# Patient Record
Sex: Male | Born: 1940 | Race: White | Hispanic: No | Marital: Married | Smoking: Former smoker
Health system: Southern US, Community
[De-identification: ages and names within clinical notes are randomized; demographics above are authoritative.]

## PROBLEM LIST (undated history)

## (undated) DIAGNOSIS — E785 Hyperlipidemia, unspecified: Secondary | ICD-10-CM

## (undated) DIAGNOSIS — C61 Malignant neoplasm of prostate: Secondary | ICD-10-CM

## (undated) DIAGNOSIS — K529 Noninfective gastroenteritis and colitis, unspecified: Secondary | ICD-10-CM

## (undated) DIAGNOSIS — R42 Dizziness and giddiness: Secondary | ICD-10-CM

## (undated) DIAGNOSIS — J101 Influenza due to other identified influenza virus with other respiratory manifestations: Secondary | ICD-10-CM

## (undated) DIAGNOSIS — R51 Headache: Secondary | ICD-10-CM

## (undated) DIAGNOSIS — R55 Syncope and collapse: Secondary | ICD-10-CM

## (undated) DIAGNOSIS — I495 Sick sinus syndrome: Secondary | ICD-10-CM

## (undated) DIAGNOSIS — K5790 Diverticulosis of intestine, part unspecified, without perforation or abscess without bleeding: Secondary | ICD-10-CM

## (undated) DIAGNOSIS — I10 Essential (primary) hypertension: Secondary | ICD-10-CM

## (undated) DIAGNOSIS — D126 Benign neoplasm of colon, unspecified: Secondary | ICD-10-CM

## (undated) DIAGNOSIS — T783XXA Angioneurotic edema, initial encounter: Secondary | ICD-10-CM

## (undated) DIAGNOSIS — R3129 Other microscopic hematuria: Secondary | ICD-10-CM

## (undated) HISTORY — DX: Diverticulosis of intestine, part unspecified, without perforation or abscess without bleeding: K57.90

## (undated) HISTORY — DX: Influenza due to other identified influenza virus with other respiratory manifestations: J10.1

## (undated) HISTORY — DX: Essential (primary) hypertension: I10

## (undated) HISTORY — DX: Other microscopic hematuria: R31.29

## (undated) HISTORY — DX: Hyperlipidemia, unspecified: E78.5

## (undated) HISTORY — DX: Benign neoplasm of colon, unspecified: D12.6

## (undated) HISTORY — PX: CATARACT EXTRACTION: SUR2

## (undated) HISTORY — DX: Syncope and collapse: R55

## (undated) HISTORY — DX: Malignant neoplasm of prostate: C61

## (undated) HISTORY — DX: Dizziness and giddiness: R42

## (undated) HISTORY — PX: RADIOACTIVE SEED IMPLANT: SHX5150

## (undated) HISTORY — DX: Sick sinus syndrome: I49.5

## (undated) HISTORY — DX: Angioneurotic edema, initial encounter: T78.3XXA

## (undated) HISTORY — DX: Noninfective gastroenteritis and colitis, unspecified: K52.9

## (undated) HISTORY — DX: Headache: R51

---
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
## 1997-12-09 ENCOUNTER — Encounter: Payer: Self-pay | Admitting: *Deleted

## 1997-12-09 ENCOUNTER — Emergency Department (HOSPITAL_COMMUNITY): Admission: EM | Admit: 1997-12-09 | Discharge: 1997-12-09 | Payer: Self-pay | Admitting: *Deleted

## 2000-11-04 LAB — HM COLONOSCOPY

## 2001-09-18 ENCOUNTER — Emergency Department (HOSPITAL_COMMUNITY): Admission: EM | Admit: 2001-09-18 | Discharge: 2001-09-18 | Payer: Self-pay | Admitting: Emergency Medicine

## 2005-03-26 DIAGNOSIS — C61 Malignant neoplasm of prostate: Secondary | ICD-10-CM

## 2005-03-26 HISTORY — DX: Malignant neoplasm of prostate: C61

## 2005-10-19 ENCOUNTER — Ambulatory Visit (HOSPITAL_COMMUNITY): Admission: RE | Admit: 2005-10-19 | Discharge: 2005-10-19 | Payer: Self-pay | Admitting: Urology

## 2005-12-04 ENCOUNTER — Ambulatory Visit: Admission: RE | Admit: 2005-12-04 | Discharge: 2005-12-16 | Payer: Self-pay | Admitting: Radiation Oncology

## 2006-06-10 ENCOUNTER — Ambulatory Visit: Payer: Self-pay | Admitting: Internal Medicine

## 2006-08-21 ENCOUNTER — Ambulatory Visit: Payer: Self-pay | Admitting: Internal Medicine

## 2006-08-27 ENCOUNTER — Encounter: Admission: RE | Admit: 2006-08-27 | Discharge: 2006-09-16 | Payer: Self-pay | Admitting: Internal Medicine

## 2006-09-30 ENCOUNTER — Ambulatory Visit: Payer: Self-pay | Admitting: Internal Medicine

## 2006-09-30 LAB — CONVERTED CEMR LAB
ALT: 22 units/L (ref 0–53)
AST: 24 units/L (ref 0–37)
Albumin: 3.9 g/dL (ref 3.5–5.2)
Alkaline Phosphatase: 84 units/L (ref 39–117)
BUN: 15 mg/dL (ref 6–23)
Basophils Absolute: 0.1 10*3/uL (ref 0.0–0.1)
Calcium: 8.9 mg/dL (ref 8.4–10.5)
Chloride: 110 meq/L (ref 96–112)
Cholesterol: 175 mg/dL (ref 0–200)
Creatinine, Ser: 0.9 mg/dL (ref 0.4–1.5)
GFR calc non Af Amer: 89 mL/min
HCT: 43.7 % (ref 39.0–52.0)
Ketones, ur: NEGATIVE mg/dL
MCHC: 33.4 g/dL (ref 30.0–36.0)
Neutrophils Relative %: 62.1 % (ref 43.0–77.0)
Nitrite: NEGATIVE
Platelets: 336 10*3/uL (ref 150–400)
RBC: 5.05 M/uL (ref 4.22–5.81)
RDW: 13.1 % (ref 11.5–14.6)
Sodium: 142 meq/L (ref 135–145)
Total Bilirubin: 0.8 mg/dL (ref 0.3–1.2)
Total CHOL/HDL Ratio: 5.6
Urine Glucose: NEGATIVE mg/dL
Urobilinogen, UA: 0.2 (ref 0.0–1.0)
VLDL: 89 mg/dL — ABNORMAL HIGH (ref 0–40)
pH: 7.5 (ref 5.0–8.0)

## 2006-10-02 ENCOUNTER — Observation Stay (HOSPITAL_COMMUNITY): Admission: EM | Admit: 2006-10-02 | Discharge: 2006-10-03 | Payer: Self-pay | Admitting: Emergency Medicine

## 2006-10-03 ENCOUNTER — Ambulatory Visit: Payer: Self-pay | Admitting: Internal Medicine

## 2006-10-04 ENCOUNTER — Ambulatory Visit: Payer: Self-pay | Admitting: Internal Medicine

## 2006-10-09 ENCOUNTER — Ambulatory Visit: Payer: Self-pay | Admitting: Internal Medicine

## 2006-10-24 ENCOUNTER — Ambulatory Visit: Payer: Self-pay | Admitting: Internal Medicine

## 2006-10-25 DIAGNOSIS — I10 Essential (primary) hypertension: Secondary | ICD-10-CM | POA: Insufficient documentation

## 2007-04-14 ENCOUNTER — Ambulatory Visit: Payer: Self-pay | Admitting: Internal Medicine

## 2007-04-14 DIAGNOSIS — E781 Pure hyperglyceridemia: Secondary | ICD-10-CM | POA: Insufficient documentation

## 2007-05-09 ENCOUNTER — Encounter: Payer: Self-pay | Admitting: Internal Medicine

## 2007-05-19 ENCOUNTER — Ambulatory Visit: Payer: Self-pay | Admitting: Internal Medicine

## 2007-05-20 ENCOUNTER — Encounter: Payer: Self-pay | Admitting: Internal Medicine

## 2007-05-20 LAB — CONVERTED CEMR LAB
ALT: 26 units/L (ref 0–53)
BUN: 14 mg/dL (ref 6–23)
GFR calc Af Amer: 96 mL/min
HDL: 35.6 mg/dL — ABNORMAL LOW (ref >39.0)
Hgb A1c MFr Bld: 5.7 % (ref 4.6–6.0)
LDL Cholesterol: 100 mg/dL — ABNORMAL HIGH (ref 0–99)
Potassium: 3.2 meq/L — ABNORMAL LOW (ref 3.5–5.1)
Sodium: 139 meq/L (ref 135–145)
Total CHOL/HDL Ratio: 4.7
Triglycerides: 152 mg/dL — ABNORMAL HIGH (ref 0–149)
VLDL: 30 mg/dL (ref 0–40)

## 2007-06-10 ENCOUNTER — Ambulatory Visit: Payer: Self-pay | Admitting: Internal Medicine

## 2007-06-10 LAB — CONVERTED CEMR LAB
BUN: 12 mg/dL (ref 6–23)
CO2: 31 meq/L (ref 19–32)
Calcium: 9.2 mg/dL (ref 8.4–10.5)
Creatinine, Ser: 1 mg/dL (ref 0.4–1.5)
GFR calc Af Amer: 96 mL/min
Glucose, Bld: 97 mg/dL (ref 70–99)
Potassium: 3.2 meq/L — ABNORMAL LOW (ref 3.5–5.1)

## 2007-06-18 ENCOUNTER — Ambulatory Visit: Payer: Self-pay | Admitting: Internal Medicine

## 2007-06-19 ENCOUNTER — Ambulatory Visit: Payer: Self-pay | Admitting: Internal Medicine

## 2007-09-19 ENCOUNTER — Encounter: Payer: Self-pay | Admitting: Internal Medicine

## 2007-10-30 ENCOUNTER — Telehealth: Payer: Self-pay | Admitting: Internal Medicine

## 2007-10-30 ENCOUNTER — Ambulatory Visit: Payer: Self-pay | Admitting: Internal Medicine

## 2007-10-30 LAB — CONVERTED CEMR LAB
AST: 22 units/L (ref 0–37)
BUN: 13 mg/dL (ref 6–23)
Chloride: 105 meq/L (ref 96–112)
Cholesterol: 167 mg/dL (ref 0–200)
Creatinine, Ser: 1 mg/dL (ref 0.4–1.5)
GFR calc Af Amer: 96 mL/min
GFR calc non Af Amer: 79 mL/min
LDL Cholesterol: 115 mg/dL — ABNORMAL HIGH (ref 0–99)
Total CHOL/HDL Ratio: 5.1
Triglycerides: 99 mg/dL (ref 0–149)
VLDL: 20 mg/dL (ref 0–40)

## 2007-11-06 ENCOUNTER — Telehealth: Payer: Self-pay | Admitting: Internal Medicine

## 2007-11-10 ENCOUNTER — Ambulatory Visit: Payer: Self-pay | Admitting: Internal Medicine

## 2007-11-10 LAB — CONVERTED CEMR LAB
BUN: 15 mg/dL (ref 6–23)
Calcium: 9.5 mg/dL (ref 8.4–10.5)
Creatinine, Ser: 1.1 mg/dL (ref 0.4–1.5)
GFR calc Af Amer: 86 mL/min
GFR calc non Af Amer: 71 mL/min
Glucose, Bld: 76 mg/dL (ref 70–99)

## 2007-11-14 ENCOUNTER — Ambulatory Visit: Payer: Self-pay | Admitting: Internal Medicine

## 2007-11-14 DIAGNOSIS — E785 Hyperlipidemia, unspecified: Secondary | ICD-10-CM | POA: Insufficient documentation

## 2007-11-18 ENCOUNTER — Encounter: Payer: Self-pay | Admitting: Internal Medicine

## 2007-11-19 ENCOUNTER — Telehealth: Payer: Self-pay | Admitting: Internal Medicine

## 2008-01-26 ENCOUNTER — Ambulatory Visit: Payer: Self-pay | Admitting: Internal Medicine

## 2008-01-26 LAB — CONVERTED CEMR LAB
ALT: 27 units/L (ref 0–53)
AST: 29 units/L (ref 0–37)
Total CHOL/HDL Ratio: 4.4

## 2008-02-13 ENCOUNTER — Encounter: Payer: Self-pay | Admitting: Internal Medicine

## 2008-05-05 ENCOUNTER — Ambulatory Visit: Payer: Self-pay | Admitting: Internal Medicine

## 2008-05-05 LAB — CONVERTED CEMR LAB
BUN: 15 mg/dL (ref 6–23)
Chloride: 106 meq/L (ref 96–112)
GFR calc Af Amer: 108 mL/min
GFR calc non Af Amer: 89 mL/min
Hgb A1c MFr Bld: 5.8 % (ref 4.6–6.0)
Potassium: 3.7 meq/L (ref 3.5–5.1)
Sodium: 141 meq/L (ref 135–145)
TSH: 1.49 microintl units/mL (ref 0.35–5.50)

## 2008-05-17 ENCOUNTER — Ambulatory Visit: Payer: Self-pay | Admitting: Internal Medicine

## 2008-11-17 ENCOUNTER — Ambulatory Visit: Payer: Self-pay | Admitting: Internal Medicine

## 2008-11-17 LAB — CONVERTED CEMR LAB
ALT: 15 units/L (ref 0–53)
AST: 20 units/L (ref 0–37)
Bilirubin, Direct: 0.3 mg/dL (ref 0.0–0.3)
Calcium: 9.6 mg/dL (ref 8.4–10.5)
Cholesterol: 122 mg/dL (ref 0–200)
Glucose, Bld: 98 mg/dL (ref 70–99)
Potassium: 3.5 meq/L (ref 3.5–5.3)
Sodium: 141 meq/L (ref 135–145)
TSH: 1.599 microintl units/mL (ref 0.350–4.500)
Total CHOL/HDL Ratio: 3.2
Total Protein: 7.5 g/dL (ref 6.0–8.3)
Triglycerides: 124 mg/dL (ref <150)

## 2008-11-18 ENCOUNTER — Encounter: Payer: Self-pay | Admitting: Internal Medicine

## 2009-01-17 ENCOUNTER — Telehealth: Payer: Self-pay | Admitting: Internal Medicine

## 2009-02-07 ENCOUNTER — Encounter: Payer: Self-pay | Admitting: Internal Medicine

## 2009-05-11 ENCOUNTER — Ambulatory Visit: Payer: Self-pay | Admitting: Internal Medicine

## 2009-06-16 ENCOUNTER — Encounter: Payer: Self-pay | Admitting: Internal Medicine

## 2009-10-31 ENCOUNTER — Encounter: Payer: Self-pay | Admitting: Internal Medicine

## 2009-10-31 LAB — CONVERTED CEMR LAB
AST: 18 units/L (ref 0–37)
BUN: 14 mg/dL (ref 6–23)
CO2: 25 meq/L (ref 19–32)
Calcium: 9.4 mg/dL (ref 8.4–10.5)
Chloride: 102 meq/L (ref 96–112)
Creatinine, Ser: 0.94 mg/dL (ref 0.40–1.50)
LDL Cholesterol: 52 mg/dL (ref 0–99)
TSH: 1.41 microintl units/mL (ref 0.350–4.500)

## 2009-11-09 ENCOUNTER — Ambulatory Visit: Payer: Self-pay | Admitting: Internal Medicine

## 2009-11-09 DIAGNOSIS — C61 Malignant neoplasm of prostate: Secondary | ICD-10-CM | POA: Insufficient documentation

## 2010-03-26 DIAGNOSIS — K529 Noninfective gastroenteritis and colitis, unspecified: Secondary | ICD-10-CM

## 2010-03-26 HISTORY — DX: Noninfective gastroenteritis and colitis, unspecified: K52.9

## 2010-04-25 NOTE — Miscellaneous (Signed)
Summary: lab orders--tsh,bmp,ast,alt,flp  Clinical Lists Changes  Orders: Added new Test order of T-Basic Metabolic Panel [id number]) - Signed Added new Test order of T-Lipid Profile [phone]) - Signed Added new Test order of T-TSH [phone]) - Signed Added new Test order of T-ALT/SGPT ([id number]) - Signed Added new Test order of T-AST/SGOT ([id number]) - Signed

## 2010-04-25 NOTE — Letter (Signed)
Summary: Isac Caddy MD  Isac Caddy MD   Imported By: Lanelle Bal 06/30/2009 10:23:16  _____________________________________________________________________  External Attachment:    Type:   Image     Comment:   External Document

## 2010-04-25 NOTE — Assessment & Plan Note (Signed)
Summary: 6 month follow up/mhf   Vital Signs:  Patient profile:   70 year old male Height:      70 inches Weight:      181.50 pounds BMI:     26.14 O2 Sat:      97 % on Room air Temp:     97.6 degrees F oral Pulse rate:   67 / minute Pulse rhythm:   regular Resp:     16 per minute BP sitting:   122 / 70  (right arm) Cuff size:   regular  Vitals Entered By: Glendell Docker CMA (May 11, 2009 7:59 AM)  O2 Flow:  Room air  Primary Care Provider:  D. Thomos Lemons DO  CC:  6 Month Follow up .  History of Present Illness: 6 Month Follow up   70 year old white male for followup Elevated PSA / prostate ca-  he was unable to get an appointment with urologist at [facility].  Pt seen by Dr. Gerome Sam urologist at [place].  mother in law in poor health - lives near [employer].  she has lung cancer  htn - stable  Preventive Screening-Counseling & Management  Alcohol-Tobacco     Smoking Status: quit  Allergies: 1)  ! Asa 2)  ! 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 Laverle Patter Diverticulosis, colon  Hypertension   Syncope History of Intermittent Vertigo ACE angioedema    Past Surgical History: Cataract extraction Tonsillectomy       Family History: Mother deceased in her 44s secondary to stroke which occurred during endarterectomy Father deceased age 27 secondary to colon cancer       Social History: Retired - formerly Data processing manager for [employer] Married Never Smoked Alcohol use-no    Smoking Status:  quit  Physical Exam  General:  alert, well-developed, and well-nourished.   Neck:  supple, no masses, and no carotid bruits.   Lungs:  normal respiratory effort and normal breath sounds.   Heart:  normal rate, regular rhythm, and no gallop.   Pulses:  dorsalis pedis and posterior tibial pulses are full and equal bilaterally Extremities:  No clubbing, cyanosis, edema   Impression &  Recommendations:  Problem # 1:  HYPERTENSION (ICD-401.9) well controlled. Maintain current medication regimen.  His updated medication list for this problem includes:    Carvedilol 25 Mg Tabs (Carvedilol) .Marland Kitchen... Take 1 tablet by mouth two times a day    Chlorthalidone 25 Mg Tabs (Chlorthalidone) ..... One by mouth q am  BP today: 122/70 Prior BP: 138/70 (11/17/2008)  Labs Reviewed: K+: 3.5 (11/17/2008) Creat: : 0.97 (11/17/2008)   Chol: 122 (11/17/2008)   HDL: 38 (11/17/2008)   LDL: 59 (11/17/2008)   TG: 124 (11/17/2008)  Problem # 2:  PROSTATE CANCER, HX OF (ICD-V10.46) Prostate biopsy planned.  He establish with Dr. Gerome Sam in [place].  Pt advised to hold asa and fish oil before precedure.  Complete Medication List: 1)  Low-dose Aspirin 81 Mg Tabs (Aspirin) .... By mouth once daily 2)  Alfalfa 500 Mg Tabs (Alfalfa) .... Take 1 tablet by mouth two times a day 3)  Prostate Tabs (Specialty vitamins products) .... By mouth once daily 4)  Carvedilol 25 Mg Tabs (Carvedilol) .... Take 1 tablet by mouth two times a day 5)  Chlorthalidone 25 Mg Tabs (Chlorthalidone) .... One by mouth q am 6)  Klor-con M20 20 Meq Tbcr (Potassium chloride crys cr) .Marland KitchenMarland KitchenMarland Kitchen  Two by mouth once daily 7)  Fish Oil 1000 Mg Caps (Omega-3 fatty acids) .... Take 1 tablet by mouth two times a day 8)  Osteo Bi-flex Joint Shield Tabs (Misc natural products) .... Take 1 tablet by mouth four times a day 9)  Pravastatin Sodium 20 Mg Tabs (Pravastatin sodium) .... One by mouth qhs 10)  Vitamin D3 1000 Unit Caps (Cholecalciferol) .... Take 1 capsule by mouth once a day 11)  Vitamin C 500 Mg Tabs (Ascorbic acid) .... Take 1 tablet by mouth once a day as needed 12)  Advil 200 Mg Caps (Ibuprofen) .... Take 1 tablet by mouth two times a day as needed  Patient Instructions: 1)  [email or web address]-calorie-counter.[email or web address]/ 2)  Please schedule a follow-up appointment in 6 months. 3)  BMP prior to visit, ICD-9:  401.9 4)  AST, ALT, FLP  prior to  visit, ICD-9:  272.4 5)  TSH prior to visit, ICD-9: 272.4 6)  Please return for lab work one (1) week before your next appointment.  Prescriptions: PRAVASTATIN SODIUM 20 MG TABS (PRAVASTATIN SODIUM) one by mouth qhs  #90 x 3   Entered and Authorized by:   D. Thomos Lemons DO   Signed by:   D. Thomos Lemons DO on 05/11/2009   Method used:   Electronically to        [facility]* (retail)       [address] [place], [place]  [address]       Ph: [phone]       Fax: [phone]   RxID:   [id number] KLOR-CON M20 20 MEQ  TBCR (POTASSIUM CHLORIDE CRYS CR) two by mouth once daily  #180 x 3   Entered and Authorized by:   D. Thomos Lemons DO   Signed by:   D. Thomos Lemons DO on 05/11/2009   Method used:   Electronically to        [facility]* (retail)       [address] [place], [place]  [address]       Ph: [phone]       Fax: [phone]   RxID:   [id number] CHLORTHALIDONE 25 MG  TABS (CHLORTHALIDONE) one by mouth q am  #90 x 3   Entered and Authorized by:   D. Thomos Lemons DO   Signed by:   D. Thomos Lemons DO on 05/11/2009   Method used:   Electronically to        [facility] (retail)       [address] [place], [place]  [address]       Ph: [phone]       Fax: [phone]   RxID:   [id number] CARVEDILOL 25 MG  TABS (CARVEDILOL) Take 1 tablet by mouth two times a day  #180 x 3   Entered and Authorized by:   D. Thomos Lemons DO   Signed by:   D. Thomos Lemons DO on 05/11/2009   Method used:   Electronically to        [facility] (retail)       [address] [place], [place]  [address]       Ph: [phone]       Fax: [phone]   RxID:   [id number]   Current Allergies (reviewed today): ! ASA ! ACE INHIBITORS

## 2010-04-25 NOTE — Assessment & Plan Note (Signed)
Summary: 6 MONTH FOLLOW UP/MHF   Vital Signs:  Patient profile:   70 year old male Weight:      181 pounds BMI:     26.06 O2 Sat:      96 % on Room air Temp:     97.9 degrees F oral Pulse rate:   69 / minute Pulse rhythm:   regular Resp:     18 per minute BP sitting:   120 / 70  (right arm) Cuff size:   regular  Vitals Entered By: Glendell Docker CMA (November 09, 2009 7:55 AM)  O2 Flow:  Room air CC: 6 Month Follow up  Is Patient Diabetic? No Pain Assessment Patient in pain? no       Does patient need assistance? Functional Status Self care Ambulation Normal Comments discuss side effects from medication for seed implantation   Primary Care Provider:  D. Thomos Lemons DO  CC:  6 Month Follow up .  History of Present Illness: 70 y/o white male for follow up  interval hx: 2  out 12 biopsy showed prostate cancer gleason score unknown pt elected to have radioactive seed implants some urinary freq and fatigue after procedure but starting to feel better  htn - has not been taking potassium pills regularly  hyperlipidemia - tolerating meds,  fair dietary compliance  Preventive Screening-Counseling & Management  Alcohol-Tobacco     Smoking Status: quit  Allergies: 1)  ! Asa 2)  ! 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   Head:  normocephalic and atraumatic.   Neck:  supple, no masses, and no carotid bruits.   Lungs:  normal respiratory effort and normal breath sounds.   Heart:  normal rate, regular rhythm, and no gallop.   Extremities:  No clubbing, cyanosis, edema Psych:  normally interactive, good eye  contact, not anxious appearing, and not depressed appearing.     Impression & Recommendations:  Problem # 1:  HYPERTENSION (ICD-401.9) Assessment Unchanged reviewed high K foods.  take potassium supplement regularly  His updated medication list for this problem includes:    Carvedilol 25 Mg Tabs (Carvedilol) .Marland Kitchen... Take 1 tablet by mouth two times a day    Chlorthalidone 25 Mg Tabs (Chlorthalidone) ..... One by mouth q am  BP today: 120/70 Prior BP: 122/70 (05/11/2009)  Labs Reviewed: K+: 3.3 (10/31/2009) Creat: : 0.94 (10/31/2009)   Chol: 128 (10/31/2009)   HDL: 37 (10/31/2009)   LDL: 52 (10/31/2009)   TG: 195 (10/31/2009)  Problem # 2:  HYPERLIPIDEMIA (ICD-272.4) Assessment: Unchanged  His updated medication list for this problem includes:    Pravastatin Sodium 20 Mg Tabs (Pravastatin sodium) ..... One by mouth qhs  Problem # 3:  PROSTATE CANCER (ICD-185) followed by Dr. Merry Lofty s/p radioactive seed implantation  Complete Medication List: 1)  Low-dose Aspirin 81 Mg Tabs (Aspirin) .... By mouth once daily 2)  Alfalfa 500 Mg Tabs (Alfalfa) .... Take 1 tablet by mouth two times a day 3)  Prostate Tabs (Specialty vitamins products) .... By mouth once daily 4)  Carvedilol 25 Mg Tabs (Carvedilol) .... Take 1 tablet by mouth two times a  day 5)  Chlorthalidone 25 Mg Tabs (Chlorthalidone) .... One by mouth q am 6)  Klor-con M20 20 Meq Tbcr (Potassium chloride crys cr) .... Two by mouth once daily 7)  Fish Oil 1000 Mg Caps (Omega-3 fatty acids) .... Take 1 tablet by mouth two times a day 8)  Osteo Bi-flex Joint Shield Tabs (Misc natural products) .... Take 1 tablet by mouth four times a day 9)  Pravastatin Sodium 20 Mg Tabs (Pravastatin sodium) .... One by mouth qhs 10)  Vitamin D3 1000 Unit Caps (Cholecalciferol) .... Take 1 capsule by mouth once a day 11)  Vitamin C 500 Mg Tabs (Ascorbic acid) .... Take 1 tablet by mouth once a day as needed 12)  Advil 200 Mg Caps (Ibuprofen)  .... Take 1 tablet by mouth two times a day as needed  Patient Instructions: 1)  Please schedule a follow-up appointment in 1 year -  30 minutes visit. 2)  BMP prior to visit, ICD-9:  401.9 3)  Hepatic Panel prior to visit, ICD-9:  272.4 4)  Lipid Panel prior to visit, ICD-9: 272.4 5)  TSH prior to visit, ICD-9:  272.4 6)  Please return for lab work one (1) week before your next appointment.  Prescriptions: PRAVASTATIN SODIUM 20 MG TABS (PRAVASTATIN SODIUM) one by mouth qhs  #90 x 3   Entered and Authorized by:   D. Thomos Lemons DO   Signed by:   D. Thomos Lemons DO on 11/09/2009   Method used:   Electronically to        [facility] (retail)       [address] [place], [place]  [address]       Ph: [phone]       Fax: [phone]   RxID:   [id number] KLOR-CON M20 20 MEQ  TBCR (POTASSIUM CHLORIDE CRYS CR) two by mouth once daily  #180 x 3   Entered and Authorized by:   D. Thomos Lemons DO   Signed by:   D. Thomos Lemons DO on 11/09/2009   Method used:   Electronically to        [facility] (retail)       [address] [place], [place]  [address]       Ph: [phone]       Fax: [phone]   RxID:   [id number] CHLORTHALIDONE 25 MG  TABS (CHLORTHALIDONE) one by mouth q am  #90 x 3   Entered and Authorized by:   D. Thomos Lemons DO   Signed by:   D. Thomos Lemons DO on 11/09/2009   Method used:   Electronically to        [facility] (retail)       [address] [place], [place]  [address]       Ph: [phone]       Fax: [phone]   RxID:   [id number] CARVEDILOL 25 MG  TABS (CARVEDILOL) Take 1 tablet by mouth two times a day  #180 x 3   Entered and Authorized by:   D. Thomos Lemons DO   Signed by:   D. Thomos Lemons DO on 11/09/2009   Method used:   Electronically to        [facility] (retail)       [address] [place], [place]  [address]  Ph: [phone]       Fax: [phone]   RxID:   [id number]   Current Allergies (reviewed today): !  ASA ! ACE INHIBITORS

## 2010-06-25 ENCOUNTER — Emergency Department (HOSPITAL_BASED_OUTPATIENT_CLINIC_OR_DEPARTMENT_OTHER)
Admission: EM | Admit: 2010-06-25 | Discharge: 2010-06-25 | Disposition: A | Discharge status: Home / Self Care | Payer: Medicare PPO | Attending: Emergency Medicine | Admitting: Emergency Medicine

## 2010-06-25 DIAGNOSIS — K5289 Other specified noninfective gastroenteritis and colitis: Secondary | ICD-10-CM | POA: Insufficient documentation

## 2010-06-25 DIAGNOSIS — R112 Nausea with vomiting, unspecified: Secondary | ICD-10-CM | POA: Insufficient documentation

## 2010-06-25 DIAGNOSIS — I10 Essential (primary) hypertension: Secondary | ICD-10-CM | POA: Insufficient documentation

## 2010-06-25 DIAGNOSIS — E860 Dehydration: Secondary | ICD-10-CM | POA: Insufficient documentation

## 2010-06-25 DIAGNOSIS — Z79899 Other long term (current) drug therapy: Secondary | ICD-10-CM | POA: Insufficient documentation

## 2010-06-25 DIAGNOSIS — E785 Hyperlipidemia, unspecified: Secondary | ICD-10-CM | POA: Insufficient documentation

## 2010-06-25 LAB — CBC
Platelets: 214 10*3/uL (ref 150–400)
RBC: 4.7 MIL/uL (ref 4.22–5.81)
WBC: 16.3 10*3/uL — ABNORMAL HIGH (ref 4.0–10.5)

## 2010-06-25 LAB — DIFFERENTIAL
Basophils Absolute: 0 10*3/uL (ref 0.0–0.1)
Basophils Relative: 0 % (ref 0–1)
Eosinophils Absolute: 0.1 10*3/uL (ref 0.0–0.7)
Neutrophils Relative %: 90 % — ABNORMAL HIGH (ref 43–77)

## 2010-06-25 LAB — BASIC METABOLIC PANEL
Chloride: 106 mEq/L (ref 96–112)
Creatinine, Ser: 0.9 mg/dL (ref 0.4–1.5)
GFR calc Af Amer: >60 mL/min (ref >60)
Potassium: 3.1 mEq/L — ABNORMAL LOW (ref 3.5–5.1)
Sodium: 144 mEq/L (ref 135–145)

## 2010-08-08 NOTE — Discharge Summary (Signed)
NAME:  Billy Burns, Billy Burns               ACCOUNT NO.:  [id number]   MEDICAL RECORD NO.:  [id number]          PATIENT TYPE:  INP   LOCATION:  3707                         FACILITY:  [facility]   PHYSICIAN:  Valerie A. Felicity Coyer, MDDATE OF BIRTH:  Feb 03, 1941   DATE OF ADMISSION:  10/02/2006  DATE OF DISCHARGE:  10/03/2006                               DISCHARGE SUMMARY   DISCHARGE DIAGNOSES:  1. Status post syncopal event with nausea and vomiting.  2. History of hypertension, blood pressure meds currently on hold.  3. Leukocytosis, improving.   HISTORY OF PRESENT ILLNESS:  Billy Burns is a 70 year old male who was  admitted on October 02, 2006 after a syncopal event.  He has a known history  of hypertension and apparently was at home when he developed a dizzy  lightheaded sensation and apparently tried to get himself down to the  floor and he struck his right elbow and started to bleed.  When the  patient came to, he vomited.  He felt fine thereafter when he was seen  in the emergency department.  He was admitted for further evaluation and  treatment.   PAST MEDICAL HISTORY:  Hypertension.   COURSE OF HOSPITALIZATION:  Syncope with nausea and vomiting.  The  patient was admitted and underwent serial cardiac enzymes which were  negative.  A D-dimer was performed and was noted to be positive  prompting a CT angio of the chest which was negative for pulmonary  embolus.  X-ray of the right elbow showed no fracture.  Chest x-ray  showed bibasilar atelectasis and interstitial prominence.  He was noted  to have leukocytosis on admission with a white blood cell count of  17,000, this trended downward.  Followup CBC noted white blood cell  count of 13,000.  He has remained afebrile.  His urinalysis is clean.  There are no acute signs of infection at this time.  His blood pressure  medications were held.  Blood pressure at time of discharge was 141/72.  We will defer resuming these medications to patient's  primary care.  He  apparently also has a history of vertigo and could consider outpatient  followup in regards to possible underlying vertigo.   MEDICATIONS AT TIME OF DISCHARGE:  Aspirin 81 mg p.o. daily.   PERTINENT LABORATORIES AT TIME OF DISCHARGE:  Serial cardiac enzymes  negative.  Hemoglobin 13.5, hematocrit 40, white blood cell count  13,000, platelets 292,000, BUN 11, creatinine 0.99, sodium 140,  potassium 4.1.   DISPOSITION:  Patient will be discharged to home.   FOLLOWUP:  The patient has an appointment scheduled already with Dr.  Thomos Lemons for tomorrow; plan to continue this appointment as scheduled.  He is instructed to go to the emergency room should he develop chest  pain, shortness of breath or if he passes out.      Sandford Craze, NP      Raenette Rover. Felicity Coyer, MD  Electronically Signed    MO/MEDQ  D:  10/03/2006  T:  10/03/2006  Job:  [id number]   cc:   Barbette Hair. Artist Pais, DO

## 2010-08-08 NOTE — H&P (Signed)
NAME:  Billy Burns, Billy Burns               ACCOUNT NO.:  [id number]   MEDICAL RECORD NO.:  [id number]          PATIENT TYPE:  INP   LOCATION:  1823                         FACILITY:  [facility]   PHYSICIAN:  Valerie A. Felicity Coyer, MDDATE OF BIRTH:  05-19-1940   DATE OF ADMISSION:  10/02/2006  DATE OF DISCHARGE:                              HISTORY & PHYSICAL   PRIMARY CARE PHYSICIAN:  Dr. Artist Pais.   CHIEF COMPLAINT:  Syncope.   HISTORY OF PRESENT ILLNESS:  The patient is a 70 year old white male  with past medical history of hypertension, who earlier today was at home  when he tripped and fell, injuring his left elbow pretty severely.  There was a fair amount of bleeding.  At some point soon after, the  patient sustained a syncopal event and what the patient recalls is that  he looked and saw his elbow wound and that there was quite a bit of  blood and he felt a little lightheadedness.  He went down to 1 knee and  was trying not to pass out, but then according to the patient, his wife  then noted that he did indeed pass out and was unresponsive for a few  seconds.  There was no loss of bowel or bladder continence, no tremors,  but when the patient awoke, he was fully alert and oriented, but still  feeling weak.  He was brought into the emergency room for further  evaluation.  In the emergency room, he was noted to have a white count  of 17.8 with 86% shift.  The rest of his labs were noted for a D-dimer  of 2.82 and a CPK level of 203 with a normal MB and troponin.  The  patient's x-rays show a normal CT scan of the head, some bibasilar  atelectasis on his chest x-ray and no fracture or acute bony abnormality  of the chest.  His vitals on admission were quite stable.  With these  findings, the patient was started on some IV fluids, given medication  for pain and nausea.  Currently, he says he is feeling much better.   REVIEW OF SYSTEMS:  He denies any current headache.  No vision changes.  No  dysphagia.  No chest pain or palpitations.  No shortness of breath,  wheeze, cough, abdominal pain, hematuria, dysuria, constipation or  diarrhea.  His only complaint is his left shoulder.  Review of systems  is otherwise negative.   PAST MEDICAL HISTORY:  Hypertension.   MEDICATIONS:  He is on lisinopril and metoprolol.   ALLERGIES:  He has no known drug allergies.   SOCIAL HISTORY:  He denies any tobacco, alcohol or drug use.   FAMILY HISTORY:  Noncontributory.   PHYSICAL EXAMINATION:  VITALS ON ADMISSION:  Temperature 97.8, heart  rate 82, blood pressure 150/70, respirations 16 and O2 sat 99% on room  air.  He was not orthostatic.  HEART:  Regular rate and rhythm.  S1 and S2.  LUNGS:  Clear to auscultation bilaterally.  ABDOMEN:  Soft, nontender and non-distended.  Positive bowel sounds.  EXTREMITIES:  No clubbing, cyanosis  or edema, with the exception of  course of his elbow being wrapped.   LABORATORY WORK:  White count is 17.8, H&H 15 and 44, MCV of 85,  platelet count 293,000 with an 86% shift.  Sodium 140, potassium 4.1,  chloride 102, bicarb 26, BUN 17, creatinine 0.99, glucose 127.  LFTs are  unremarkable.  Coagulations are unremarkable.  UA is unremarkable.  CPK  166, MB 1.6, troponin I less than 0.05.  Second set:  CPK is slightly  elevated, more at 203.  His D-dimer is elevated at 2.82.   X-RAYS:  As per HPI.   ASSESSMENT AND PLAN:  1. Syncopal event:  As far as the etiology of his actual syncopal      event, he may have been vasovagal from the site of blood coming      from his elbow.  It is difficult to saw what exactly happened.  He      does not appear to have seizure or another reason for this, but      favor for right now conservative measures with hydration and      continuing to follow how the patient does.  2. Elevated D-dimer:  I feel that this is more elevated due to a CPK      level.  I do not think the patient has had a pulmonary embolus; he       shows no signs of it; he is not hypoxic, never complained of      shortness of breath or chest pain and I have a low index of      suspicion for that.  Therefore, I have cancelled the CT scan      attributing to this, but I will defer to Dr. Felicity Coyer, his      attending physician.  3. Leukocytosis:  Some of this may be indeed stress margination from      the actual syncopal event and the muscle injury.  I would favor      again gentle hydration at this time.  I see no signs of infection.      His chest x-ray and urine have cleared, no reason for antibiotics,      but we will check a repeat CPK level as well as a CBC this      afternoon to better evaluate the patient.  In the meantime, we will      place the patient on telemetry.  4. Hypertension:  We are currently holding the patient's medications,      watching his blood pressure.      Hollice Espy, M.D.   Electronically Signed     ______________________________  Raenette Rover Felicity Coyer, MD    SKK/MEDQ  D:  10/02/2006  T:  10/02/2006  Job:  [id number]   cc:   Barbette Hair. Artist Pais, DO

## 2010-08-11 NOTE — Assessment & Plan Note (Signed)
[facility]                           PRIMARY CARE OFFICE NOTE   DENARIUS, SESLER                        MRN:          [id number]  DATE:06/10/2006                            DOB:          25-Jul-1940    CHIEF COMPLAINT:  New patient for practice.   HISTORY OF PRESENT ILLNESS:  The patient is a 70 year old white male  here to establish primary care.  He was formerly followed by Dr. Izola Price  of [facility].  His medical history is significant for high blood  pressure.  He has been on antihypertensives since 2003.  He denies any  history of coronary artery disease or stroke in the past.  His blood  pressure at home is usually well controlled, with blood pressure  readings in the 120s systolic.  He was referred to a urologist within  the last one or two years regarding elevated PSA.  He is unsure whether  he has had definitive diagnosis of prostate cancer, but sounds like  patient underwent prostate biopsy and is scheduled for followup.  He has  a family history of colon cancer, his father deceased at age 28.  He has  had colonoscopies in the past, with most recent one performed in 2007.  Did not reveal any colon polyps but only mild diverticulosis.   PAST MEDICAL HISTORY:  Summary:  1. Hypertension.  2. Diverticulosis.  3. Questionable prostate cancer.  4. History of episodic vertigo once in 1999, repeat 2003, unclear      etiology.  5. Status post tonsillectomy, 1950s.  6. Status post bilateral cataract surgery.   CURRENT MEDICATIONS:  1. Metoprolol 50 mg twice a day.  2. Hydrochlorothiazide 25 mg once a day.  3. Aspirin 81 mg once a day.   MEDICATION ALLERGIES:  ASPIRIN WHICH CAUSES STOMACH UPSET.   SOCIAL HISTORY:  Patient is married.  He is a retired Data processing manager for  [employer].   FAMILY HISTORY:  Mother deceased in her 81s secondary to CVA which  occurred during surgery repair.  Father deceased at age 80 secondary to  colon  cancer.   HABITS:  He denies any alcohol or any tobacco or recreational drug use.   REVIEW OF SYSTEMS:  No fevers, chills.  No HEENT symptoms.  He is  working on mild weight loss.  No chest pain or dyspnea.  He does  exercise on a regular basis.  No heart burn, nausea, vomiting,  constipation, diarrhea.  No dark stools or blood in his stool.  Denies  any erectile dysfunction or other sexual difficulty.   PHYSICAL EXAMINATION:  VITAL SIGNS:  Height is 5 foot 10, weight is 187  pounds.  Temperature 97.1, pulse is 61, BP is 142/72 and the last in the  seated position.  GENERAL:  The patient is a pleasant, well-developed, well-nourished 67-  year-old white male in no apparent distress.  HEENT:  Normocephalic, atraumatic.  Pupils were equally reactive to  light bilaterally.  Extraocular muscles are intact.  Patient was  anicteric.  Conjunctivae was within normal limits.  Evidence of previous  iridectomy bilaterally.  Oropharyngeal exam was unremarkable.  External  auditory canals and tympanic membranes were clear bilaterally.  Hearing  is grossly normal.  NECK:  Supple, no adenopathy, carotid bruits or cardiomegaly.  CHEST:  Normal inspiratory effort.  Chest was clear to auscultation  bilaterally.  No rhonchi, rales or wheezing.  CARDIOVASCULAR:  Regular rate and rhythm, no significant murmurs, rubs  or gallops.  ABDOMEN:  Slightly protuberant but nontender, positive bowel sounds, no  organomegaly.  MUSCULOSKELETAL:  No clubbing, cyanosis or edema.  The patient had  intact pedis dorsalis pulses, equal and symmetric.  NEUROLOGIC:  Cranial nerves 2-12 is grossly intact.  He was nonfocal.  He ambulates without any assistive devices.   IMPRESSION/RECOMMENDATIONS:  1. Hypertension, stable.  2. History of elevated PSA, question prostate cancer.  3. Diverticulosis.  4. History of intermittent vertigo, unclear etiology.  5. Health maintenance.   RECOMMENDATIONS:  Patient is to continue his  current regimen.  We will  try to obtain recent labs that were performed by his previous primary  care physician and also obtain notes from his urologist.  Followup time  is in approximately 8 weeks.     Barbette Hair. Artist Pais, DO  Electronically Signed    RDY/MedQ  DD: 06/10/2006  DT: 06/10/2006  Job #: [id number]

## 2010-10-30 ENCOUNTER — Encounter: Payer: Self-pay | Admitting: Internal Medicine

## 2010-11-13 ENCOUNTER — Encounter: Payer: Medicare Other | Admitting: Family Medicine

## 2010-11-14 ENCOUNTER — Ambulatory Visit (INDEPENDENT_AMBULATORY_CARE_PROVIDER_SITE_OTHER): Payer: Medicare Other | Admitting: Family Medicine

## 2010-11-14 ENCOUNTER — Telehealth: Payer: Self-pay | Admitting: Family Medicine

## 2010-11-14 ENCOUNTER — Encounter: Payer: Self-pay | Admitting: Family Medicine

## 2010-11-14 DIAGNOSIS — Z8546 Personal history of malignant neoplasm of prostate: Secondary | ICD-10-CM

## 2010-11-14 DIAGNOSIS — Z23 Encounter for immunization: Secondary | ICD-10-CM

## 2010-11-14 DIAGNOSIS — C61 Malignant neoplasm of prostate: Secondary | ICD-10-CM

## 2010-11-14 DIAGNOSIS — I10 Essential (primary) hypertension: Secondary | ICD-10-CM

## 2010-11-14 DIAGNOSIS — Z0000 Encounter for general adult medical examination without abnormal findings: Secondary | ICD-10-CM

## 2010-11-14 DIAGNOSIS — E785 Hyperlipidemia, unspecified: Secondary | ICD-10-CM

## 2010-11-14 LAB — COMPREHENSIVE METABOLIC PANEL
ALT: 19 U/L (ref 0–53)
Albumin: 4.4 g/dL (ref 3.5–5.2)
CO2: 29 mEq/L (ref 19–32)
Calcium: 9.5 mg/dL (ref 8.4–10.5)
Chloride: 103 mEq/L (ref 96–112)
Creatinine, Ser: 1 mg/dL (ref 0.4–1.5)
GFR: 82.91 mL/min (ref >60.00)
Total Protein: 7.6 g/dL (ref 6.0–8.3)

## 2010-11-14 LAB — CBC WITH DIFFERENTIAL/PLATELET
Eosinophils Relative: 2.5 % (ref 0.0–5.0)
HCT: 44 % (ref 39.0–52.0)
Hemoglobin: 14.8 g/dL (ref 13.0–17.0)
Lymphs Abs: 2 10*3/uL (ref 0.7–4.0)
Monocytes Relative: 6.9 % (ref 3.0–12.0)
Platelets: 264 10*3/uL (ref 150.0–400.0)
WBC: 6.3 10*3/uL (ref 4.5–10.5)

## 2010-11-14 LAB — TSH: TSH: 0.45 u[IU]/mL (ref 0.35–5.50)

## 2010-11-14 MED ORDER — CHLORTHALIDONE 25 MG PO TABS: 25.0000 mg | ORAL_TABLET | Freq: Every day | ORAL | Status: DC

## 2010-11-14 MED ORDER — CARVEDILOL 25 MG PO TABS: 25.0000 mg | ORAL_TABLET | Freq: Two times a day (BID) | ORAL | Status: DC

## 2010-11-14 MED ORDER — POTASSIUM CHLORIDE CRYS ER 20 MEQ PO TBCR: EXTENDED_RELEASE_TABLET | ORAL | Status: DC

## 2010-11-14 MED ORDER — ZOSTER VACCINE LIVE 19400 UNT/0.65ML ~~LOC~~ SOLR: 0.6500 mL | Freq: Once | SUBCUTANEOUS | Status: DC

## 2010-11-14 NOTE — Assessment & Plan Note (Signed)
Problem stable.  Continue current medications and diet appropriate for this condition.  We have reviewed our general long term plan for this problem and also reviewed symptoms and signs that should prompt the patient to call or return to the office. Check lytes/cr today.  

## 2010-11-14 NOTE — Progress Notes (Signed)
Office Note 11/14/2010  CC:  Chief Complaint  Patient presents with  . Annual Exam    no problems    HPI:  Billy Burns is a 70 y.o. White male who is here for CPE.  Former pt of Dr. Dora Sims is my first time seeing him. He feels well, has no acute complaints. Gets some achiness in most joints from time to time, c/w osteoarthritic pain.  No swelling, no redness, no signif stiffness. Has occasional lightheaded feeling with going from squatting to standing, no syncope. Home bp monitoring shows normal bps.   Compliant with all meds w/out signif side effect.  Regarding colon cancer screening, he says his colonoscopy about 10 yrs ago showed no polyps/masses but he feels like his body "fought it" a lot and doesn't want a repeat screening colonoscopy.  He is agreeable to iFOB testing.   Past Medical History  Diagnosis Date  . Cancer 2008    prostate, Dr. Laverle Patter  . Diverticulosis   . Hypertension   . Syncope   . Allergic angioedema     ACE inhibitor    Past Surgical History  Procedure Date  . Eye surgery     cataract (lens implants bilat)  . Tonsillectomy   . Radioactive seed implant     prostate 07/2009.  PSA dropped to below 2 after this (Dr. Gerome Sam at [facility])    Family History  Problem Relation Age of Onset  . Stroke Mother     during endarterectomy  . Cancer Father     colon    History   Social History  . Marital Status: Married    Spouse Name: Kathie Rhodes    Number of Children: N/A  . Years of Education: N/A   Occupational History  . Retired     Data processing manager for [employer]   Social History Main Topics  . Smoking status: Former Smoker    Quit date: 03/26/1964  . Smokeless tobacco: Never Used  . Alcohol Use: No  . Drug Use: No  . Sexually Active: Not on file   Other Topics Concern  . Not on file   Social History Narrative   Married, retired from [employer] 2004.No T/A/Ds.Walks daily, push mows his yard.    No  outpatient prescriptions prior to visit.    Allergies  Allergen Reactions  . Ace Inhibitors     REACTION: Angioedema  . Aspirin     REACTION: Stomach Discomfort    ROS Review of Systems  Constitutional: Negative for fever, chills, appetite change and fatigue.  HENT: Negative for ear pain, congestion, sore throat, neck stiffness and dental problem.   Eyes: Negative for discharge, redness and visual disturbance.  Respiratory: Negative for cough, chest tightness, shortness of breath and wheezing.   Cardiovascular: Negative for chest pain, palpitations and leg swelling.  Gastrointestinal: Negative for nausea, vomiting, abdominal pain, diarrhea and blood in stool.  Genitourinary: Negative for dysuria, urgency, frequency, hematuria, flank pain and difficulty urinating.  Musculoskeletal: Negative for myalgias, back pain, joint swelling and arthralgias.  Skin: Negative for pallor and rash.  Neurological: Negative for dizziness, speech difficulty, weakness and headaches.  Hematological: Negative for adenopathy. Does not bruise/bleed easily.  Psychiatric/Behavioral: Negative for confusion and sleep disturbance. The patient is not nervous/anxious.      PE; Blood pressure 130/70, pulse 70, height 5' 9.5" (1.765 m), weight 175 lb (79.379 kg), SpO2 92.00%. Gen: Alert, well appearing.  Patient is oriented to person, place, time, and  situation. HEENT: Scalp without lesions or hair loss.  Ears: EACs clear, normal epithelium.  TMs with good light reflex and landmarks bilaterally.  Eyes: no injection, icteris, swelling, or exudate.  EOMI, PERRLA. Nose: no drainage or turbinate edema/swelling.  No injection or focal lesion.  Mouth: lips without lesion/swelling.  Oral mucosa pink and moist.  Dentition intact and without obvious caries or gingival swelling.  Oropharynx without erythema, exudate, or swelling.  Neck: supple, ROM full.  Carotids 2+ bilat, without bruit.  No lymphadenopathy, thyromegaly, or  mass. Chest: symmetric expansion, nonlabored respirations.  Clear and equal breath sounds in all lung fields.   CV: RRR, no m/r/g.  Peripheral pulses 2+ and symmetric. ABD: soft, NT, ND, BS normal.  No hepatospenomegaly or mass.  No bruits. EXT: no clubbing, cyanosis, or edema.   Pertinent labs:  none  ASSESSMENT AND PLAN:   Health maintenance examination Doing well.  Continue daily exercise and prudent diet. Zostavax rx given today---he'll go to [facility] for this. iFOB will be done ASAP for colon cancer screening. Routine labs done today. PSA's followed by his urologist.  HYPERTENSION Problem stable.  Continue current medications and diet appropriate for this condition.  We have reviewed our general long term plan for this problem and also reviewed symptoms and signs that should prompt the patient to call or return to the office. Check lytes/cr today.  HYPERLIPIDEMIA Check FLP today.  PROSTATE CANCER Routine f/u with urologist.     FOLLOW UP:  Return in about 1 year (around 11/14/2011) for cpe.

## 2010-11-14 NOTE — Assessment & Plan Note (Signed)
Routine f/u with urologist.

## 2010-11-14 NOTE — Assessment & Plan Note (Signed)
Check FLP today. 

## 2010-11-14 NOTE — Telephone Encounter (Signed)
Pls request records from [facility] Gerome [facility]. Thx--PM.

## 2010-11-14 NOTE — Assessment & Plan Note (Signed)
Doing well.  Continue daily exercise and prudent diet. Zostavax rx given today---he'll go to [facility] for this. iFOB will be done ASAP for colon cancer screening. Routine labs done today. PSA's followed by his urologist.

## 2010-11-17 NOTE — Telephone Encounter (Signed)
Will have patient to sign a release when he comes in for his next appointment.

## 2010-11-23 ENCOUNTER — Other Ambulatory Visit: Payer: Medicare Other

## 2010-11-23 ENCOUNTER — Other Ambulatory Visit: Payer: Self-pay | Admitting: Family Medicine

## 2010-11-23 DIAGNOSIS — Z1211 Encounter for screening for malignant neoplasm of colon: Secondary | ICD-10-CM

## 2010-11-23 LAB — FECAL OCCULT BLOOD, IMMUNOCHEMICAL: Fecal Occult Bld: NEGATIVE

## 2010-11-29 ENCOUNTER — Other Ambulatory Visit (INDEPENDENT_AMBULATORY_CARE_PROVIDER_SITE_OTHER): Payer: Medicare PPO

## 2010-11-29 DIAGNOSIS — E876 Hypokalemia: Secondary | ICD-10-CM

## 2010-11-29 LAB — BASIC METABOLIC PANEL
Calcium: 8.9 mg/dL (ref 8.4–10.5)
Creatinine, Ser: 1.1 mg/dL (ref 0.4–1.5)
GFR: 71.5 mL/min (ref >60.00)
Glucose, Bld: 96 mg/dL (ref 70–99)
Sodium: 140 mEq/L (ref 135–145)

## 2010-11-30 ENCOUNTER — Encounter: Payer: Self-pay | Admitting: Family Medicine

## 2010-11-30 ENCOUNTER — Other Ambulatory Visit: Payer: Self-pay | Admitting: Family Medicine

## 2010-11-30 DIAGNOSIS — E876 Hypokalemia: Secondary | ICD-10-CM

## 2010-11-30 MED ORDER — POTASSIUM CHLORIDE CRYS ER 20 MEQ PO TBCR: EXTENDED_RELEASE_TABLET | ORAL | Status: DC

## 2010-12-18 ENCOUNTER — Other Ambulatory Visit (INDEPENDENT_AMBULATORY_CARE_PROVIDER_SITE_OTHER): Payer: Medicare PPO

## 2010-12-18 DIAGNOSIS — E876 Hypokalemia: Secondary | ICD-10-CM

## 2010-12-18 LAB — BASIC METABOLIC PANEL
BUN: 14 mg/dL (ref 6–23)
Calcium: 9.1 mg/dL (ref 8.4–10.5)
Chloride: 103 mEq/L (ref 96–112)
Creatinine, Ser: 1 mg/dL (ref 0.4–1.5)
GFR: 81.9 mL/min (ref >60.00)

## 2010-12-18 NOTE — Progress Notes (Signed)
Quick Note:  Pls notify patient: his potassium is now normal. I recommend he continue to take 3 of his 20 mEQ tabs twice daily (6 tabs total per day). Thx--PM ______

## 2011-01-09 LAB — URINALYSIS, ROUTINE W REFLEX MICROSCOPIC
Glucose, UA: NEGATIVE
Ketones, ur: NEGATIVE
Nitrite: NEGATIVE
Protein, ur: NEGATIVE

## 2011-01-09 LAB — CARDIAC PANEL(CRET KIN+CKTOT+MB+TROPI)
CK, MB: 2.7
Relative Index: 1.4
Relative Index: 1.4
Total CK: 190
Troponin I: 0.01
Troponin I: 0.04

## 2011-01-09 LAB — POCT CARDIAC MARKERS
CKMB, poc: 1
CKMB, poc: 1.6
Myoglobin, poc: 203

## 2011-01-09 LAB — CBC
HCT: 40
HCT: 44.4
MCHC: 33.7
MCV: 85.2
MCV: 85.3
Platelets: 293
RBC: 4.68
RBC: 5.21
WBC: 17.8 — ABNORMAL HIGH

## 2011-01-09 LAB — PROTIME-INR: Prothrombin Time: 13.4

## 2011-01-09 LAB — COMPREHENSIVE METABOLIC PANEL
BUN: 17
CO2: 26
Chloride: 102
Creatinine, Ser: 0.99
GFR calc non Af Amer: >60
Total Bilirubin: 0.7

## 2011-01-09 LAB — DIFFERENTIAL
Basophils Absolute: 0
Lymphocytes Relative: 10 — ABNORMAL LOW
Neutro Abs: 15.3 — ABNORMAL HIGH
Neutrophils Relative %: 86 — ABNORMAL HIGH

## 2011-01-09 LAB — CK ISOENZYMES

## 2011-01-09 LAB — APTT: aPTT: 26

## 2011-01-09 LAB — CK TOTAL AND CKMB (NOT AT ARMC): CK, MB: 2.2

## 2011-02-24 DIAGNOSIS — J101 Influenza due to other identified influenza virus with other respiratory manifestations: Secondary | ICD-10-CM

## 2011-02-24 HISTORY — DX: Influenza due to other identified influenza virus with other respiratory manifestations: J10.1

## 2011-02-24 HISTORY — PX: TRANSTHORACIC ECHOCARDIOGRAM: SHX275

## 2011-02-24 HISTORY — PX: PACEMAKER INSERTION: SHX728

## 2011-03-09 ENCOUNTER — Other Ambulatory Visit: Payer: Self-pay | Admitting: Family Medicine

## 2011-03-09 MED ORDER — CARVEDILOL 25 MG PO TABS: 25.0000 mg | ORAL_TABLET | Freq: Two times a day (BID) | ORAL | Status: DC

## 2011-03-09 MED ORDER — PRAVASTATIN SODIUM 20 MG PO TABS: 20.0000 mg | ORAL_TABLET | Freq: Every day | ORAL | Status: DC

## 2011-03-09 NOTE — Telephone Encounter (Signed)
Last seen 11/14/10, follow up in one year.  30 x 3 sent to local pharm.  Pt notified.

## 2011-03-20 ENCOUNTER — Encounter: Payer: Self-pay | Admitting: Internal Medicine

## 2011-03-21 ENCOUNTER — Encounter: Payer: Self-pay | Admitting: Internal Medicine

## 2011-03-23 ENCOUNTER — Encounter: Payer: Self-pay | Admitting: Internal Medicine

## 2011-03-30 ENCOUNTER — Encounter: Payer: Self-pay | Admitting: Family Medicine

## 2011-03-30 ENCOUNTER — Ambulatory Visit (INDEPENDENT_AMBULATORY_CARE_PROVIDER_SITE_OTHER): Payer: Medicare PPO | Admitting: Family Medicine

## 2011-03-30 DIAGNOSIS — J111 Influenza due to unidentified influenza virus with other respiratory manifestations: Secondary | ICD-10-CM

## 2011-03-30 DIAGNOSIS — Z950 Presence of cardiac pacemaker: Secondary | ICD-10-CM

## 2011-03-30 DIAGNOSIS — I469 Cardiac arrest, cause unspecified: Secondary | ICD-10-CM

## 2011-03-30 DIAGNOSIS — J101 Influenza due to other identified influenza virus with other respiratory manifestations: Secondary | ICD-10-CM

## 2011-03-30 LAB — BASIC METABOLIC PANEL
BUN: 13 mg/dL (ref 6–23)
CO2: 25 mEq/L (ref 19–32)
Calcium: 9.4 mg/dL (ref 8.4–10.5)
Creat: 1.01 mg/dL (ref 0.50–1.35)
Glucose, Bld: 82 mg/dL (ref 70–99)

## 2011-03-30 NOTE — Progress Notes (Signed)
OFFICE NOTE  04/02/2011  CC:  Chief Complaint  Patient presents with  . Follow-up    pacemaker in [place]     HPI:   Patient is a 71 y.o. Caucasian male who is here for f/u recent pacemaker insertion when out of town for the holidays. Describes acute onset of severe fatigue, cough, followed shortly by vomiting and diarrhea.  Wretching with vomiting caused a syncopal episode, EMS was called and apparently he kept having periods of asystole (some symptomatic and some not), especially when wretching/vomiting.  In hospital this prompted insertion of temporary and then a permanent pacemaker [facility] in [place], [place], 03/19/11-03/23/11- No records avail at this time).  Apparently no sign of myocardial injury on blood tests, no specific cardiology testing done other than EKG/telemetry per pt/wife report.  He did test positive for influenza A during this hospitalization. His flu sx's have largely resolved, just a bit of residual cough that he describes as a "habit" cough more than anything else.    Pertinent PMH:  Past Medical History  Diagnosis Date  . Cancer 2008    prostate, Dr. Laverle Patter  . Diverticulosis   . Hypertension   . Syncope   . Allergic angioedema     ACE inhibitor   Past Surgical History  Procedure Date  . Eye surgery     cataract (lens implants bilat)  . Tonsillectomy   . Radioactive seed implant     prostate 07/2009.  PSA dropped to below 2 after this (Dr. Gerome Sam at [facility] in [facility])  . Pacemaker insertion 02/2011   Past family and social history reviewed and there are no changes since the patient's last office visit with me.  MEDS;   Outpatient Prescriptions Prior to Visit  Medication Sig Dispense Refill  . Alfalfa 500 MG TABS 9-12 TABS/DAY       . aspirin EC 81 MG tablet Take 81 mg by mouth daily.        . carvedilol (COREG) 25 MG tablet Take 1 tablet (25 mg total) by mouth 2 (two) times daily with a meal.  30 tablet  3  . chlorthalidone  (HYGROTON) 25 MG tablet Take 1 tablet (25 mg total) by mouth daily.  90 tablet  3  . cholecalciferol (VITAMIN D) 1000 UNITS tablet Take 1,000 Units by mouth daily.        . NON FORMULARY Take 2 tablets by mouth daily. PROSTATE ESSENTIALS       . NON FORMULARY Take 3 tablets by mouth daily. OSTEOMATRIX       . Omega-3 Fatty Acids (FISH OIL) 1000 MG CAPS Take 1 capsule by mouth 2 (two) times daily.        . potassium chloride SA (K-DUR,KLOR-CON) 20 MEQ tablet 3 tabs po qd  270 tablet  1  . pravastatin (PRAVACHOL) 20 MG tablet Take 1 tablet (20 mg total) by mouth daily.  30 tablet  3  . vitamin C (ASCORBIC ACID) 500 MG tablet Take 500 mg by mouth daily.        Marland Kitchen ibuprofen (ADVIL,MOTRIN) 200 MG tablet Take 200 mg by mouth every 6 (six) hours as needed.        . zoster vaccine live, PF, (ZOSTAVAX) 13086 UNT/0.65ML injection Inject 19,400 Units into the skin once.  1 vial  0    PE: Blood pressure 116/70, pulse 77, temperature 97.9 F (36.6 C), temperature source Temporal, height 5' 9.5" (1.765 m), weight 167 lb (75.751 kg), SpO2 97.00%.  Gen: Alert, well appearing.  Patient is oriented to person, place, time, and situation. ENT: Ears: EACs clear, normal epithelium.  TMs with good light reflex and landmarks bilaterally.  Eyes: no injection, icteris, swelling, or exudate.  EOMI, PERRLA. Nose: no drainage or turbinate edema/swelling.  No injection or focal lesion.  Mouth: lips without lesion/swelling.  Oral mucosa pink and moist.  Dentition intact and without obvious caries or gingival swelling.  Oropharynx without erythema, exudate, or swelling.  Neck - No masses or thyromegaly or limitation in range of motion CV: RRR, no m/r/g.  Left upper chest wall with palpable pacer device in subQ tissue, skin incision healing well and does not look infected.  Steri-strips still intact. LUNGS: CTA bilat, nonlabored resps, good aeration in all lung fields. EXT: no clubbing, cyanosis, or edema.    IMPRESSION AND  PLAN:  Pacemaker For prolonged asystole/extreme vagal response to wretching; doing very well s/p pacer insertion.   Will check electrolytes today.  Will obtain records from hospitalization in [place]/pacer info. Will refer him to local cardiologist to follow him for this problem.  Influenza A Resolved. Will obtain hosp records. Pt had declined the flu vaccine this season but says he plans on getting it next season.     FOLLOW UP:  Return in about 3 months (around 06/28/2011) for f/u HTN.

## 2011-04-02 DIAGNOSIS — Z950 Presence of cardiac pacemaker: Secondary | ICD-10-CM | POA: Insufficient documentation

## 2011-04-02 NOTE — Assessment & Plan Note (Addendum)
For prolonged asystole/extreme vagal response to wretching; doing very well s/p pacer insertion.   Will check electrolytes today.  Will obtain records from hospitalization in [place]/pacer info. Will refer him to local cardiologist to follow him for this problem.

## 2011-04-02 NOTE — Assessment & Plan Note (Signed)
Resolved. Will obtain hosp records. Pt had declined the flu vaccine this season but says he plans on getting it next season.

## 2011-04-09 ENCOUNTER — Encounter: Payer: Self-pay | Admitting: Family Medicine

## 2011-04-09 DIAGNOSIS — I495 Sick sinus syndrome: Secondary | ICD-10-CM | POA: Insufficient documentation

## 2011-04-12 ENCOUNTER — Telehealth: Payer: Self-pay | Admitting: *Deleted

## 2011-04-12 MED ORDER — PRAVASTATIN SODIUM 20 MG PO TABS: 20.0000 mg | ORAL_TABLET | Freq: Every day | ORAL | Status: DC

## 2011-04-12 MED ORDER — POTASSIUM CHLORIDE CRYS ER 20 MEQ PO TBCR: EXTENDED_RELEASE_TABLET | ORAL | Status: DC

## 2011-04-12 MED ORDER — CARVEDILOL 25 MG PO TABS: 25.0000 mg | ORAL_TABLET | Freq: Two times a day (BID) | ORAL | Status: DC

## 2011-04-12 MED ORDER — CHLORTHALIDONE 25 MG PO TABS: 25.0000 mg | ORAL_TABLET | Freq: Every day | ORAL | Status: DC

## 2011-04-12 NOTE — Telephone Encounter (Signed)
RC from pt.  All RX's printed and faxed to [facility].

## 2011-04-12 NOTE — Telephone Encounter (Signed)
Pt needs 90 day refill sent to mail order.  Message left for patient to call regarding clarification of request brought in to office.

## 2011-05-01 ENCOUNTER — Encounter: Payer: Self-pay | Admitting: Internal Medicine

## 2011-05-01 ENCOUNTER — Ambulatory Visit (INDEPENDENT_AMBULATORY_CARE_PROVIDER_SITE_OTHER): Payer: Medicare Other | Admitting: Internal Medicine

## 2011-05-01 ENCOUNTER — Telehealth: Payer: Self-pay | Admitting: Family Medicine

## 2011-05-01 ENCOUNTER — Telehealth: Payer: Self-pay | Admitting: Internal Medicine

## 2011-05-01 VITALS — BP 133/77 | HR 72 | Ht 69.0 in | Wt 171.8 lb

## 2011-05-01 DIAGNOSIS — E876 Hypokalemia: Secondary | ICD-10-CM

## 2011-05-01 DIAGNOSIS — J101 Influenza due to other identified influenza virus with other respiratory manifestations: Secondary | ICD-10-CM

## 2011-05-01 DIAGNOSIS — Z950 Presence of cardiac pacemaker: Secondary | ICD-10-CM

## 2011-05-01 DIAGNOSIS — R55 Syncope and collapse: Secondary | ICD-10-CM | POA: Insufficient documentation

## 2011-05-01 DIAGNOSIS — J111 Influenza due to unidentified influenza virus with other respiratory manifestations: Secondary | ICD-10-CM

## 2011-05-01 DIAGNOSIS — I10 Essential (primary) hypertension: Secondary | ICD-10-CM

## 2011-05-01 DIAGNOSIS — I495 Sick sinus syndrome: Secondary | ICD-10-CM

## 2011-05-01 LAB — PACEMAKER DEVICE OBSERVATION
AL IMPEDENCE PM: 481 Ohm
AL THRESHOLD: 0.8 V
RV LEAD AMPLITUDE: 15.8 mv
RV LEAD IMPEDENCE PM: 684 Ohm
VENTRICULAR PACING PM: 1

## 2011-05-01 MED ORDER — AMLODIPINE BESYLATE 10 MG PO TABS: 10.0000 mg | ORAL_TABLET | Freq: Every day | ORAL | Status: DC

## 2011-05-01 MED ORDER — POTASSIUM CHLORIDE CRYS ER 20 MEQ PO TBCR: EXTENDED_RELEASE_TABLET | ORAL | Status: DC

## 2011-05-01 NOTE — Assessment & Plan Note (Signed)
Patient has hypokalemia in the setting of hypertension treated with chlorthalidone I would recommend we discontinue the chlorthalidone and choose a different therapy that does not resulted potassium wasting. If he continues to have hypertension and potassium wasting hyperaldosteronism should be excluded.

## 2011-05-01 NOTE — Assessment & Plan Note (Signed)
As above.

## 2011-05-01 NOTE — Telephone Encounter (Signed)
Pls call pt and ask him to stop his chlorthalidone.  This was a recommendation made by Dr. Graciela Husbands, his cardiologist. Tell him I'll send new bp med to his pharmacy ([facility] on [address] in [place]) to replace this one.  He can stop taking his potassium now, too. Recheck BMET (lab visit) in 2 wks.  Will enter order.  thx

## 2011-05-01 NOTE — Assessment & Plan Note (Signed)
   [employer] Advantio DRIS-1 dual-chamber pacemaker, model X4776738, serial [id number]. Guidant right atrial Fine Line 2 easy Sterox bipolar lead, model # [id number], serial # [id number]. Guidant right ventricular Fine Line II EZ Sterox bipolar lead, model # [id number], serial [id number].

## 2011-05-01 NOTE — Assessment & Plan Note (Signed)
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 I have reviewed with the family extensively the physiology of this where by recurrent triggers could result in syncope notwithstanding the prevention of bradycardia because of vasodilatation and resultant hypotension.  The issue was also bagged by the accompanying symptoms as to whether there is a primary GI process giving rise to the nausea and vomiting rather than it being autocrine associated epiphenomenon  I recommend that we consider GI consultation to address this

## 2011-05-01 NOTE — Patient Instructions (Signed)
Your physician recommends that you schedule a follow-up appointment in: 2 months with Kristin/ Gunnar Fusi for a device check & 4 months with Dr. Graciela Husbands.  Your physician recommends that you continue on your current medications as directed. Please refer to the Current Medication list given to you today.

## 2011-05-01 NOTE — Telephone Encounter (Signed)
Discuss this patient with Dr. Graciela Husbands today. The patient has recurrent nausea vomiting and diarrhea, episodic, typically followed by vasovagal syncope. Dr. Graciela Husbands is wondering if there is some sore primary episodic GI problem causing this and would like the patient evaluated. I told him we would contact the patient and schedule appointment, he says it is not urgent.

## 2011-05-01 NOTE — Assessment & Plan Note (Signed)
The patient's device was interrogated.  The information was reviewed. No changes were made in the programming.    Patient will need to come back in 2 months

## 2011-05-01 NOTE — Progress Notes (Signed)
History and Physical  Patient ID: Billy Burns MRN: [id number], SOB: [birth year] 71 y.o. Date of Encounter: 05/01/2011, 5:32 PM  Primary Physician: Jeoffrey Massed, MD, MD Primary Cardiologist:  Primary Electrophysiologist:  Graciela Husbands   Chief Complaint: new pacemaker  History of Present Illness: Billy Burns is a 71 y.o. male seen following pacemaker implantation in [place].  He has a history of recurrent syncope. These episodes almost always preceded by nausea and vomiting and diarrhea. He was exposed to the flu well visiting his grandchildren over Christmas in [place]. He developed the aforementioned symptoms again and was taken to hospital following a syncopal episode. He then had recurrent nausea and vomiting and while on telemetry, had a 27 second pause. He was admitted for pacing. Other testing was apparently unremarkable.  Each of his previous syncopal episodes had been preceded with the same type of prodrome. He is a long-standing history of vasovagal syncope including with phlebotomy.  Otherwise he  denies SOB, chest pain edema or palpitations.  There has been no syncope or presyncope.    Past Medical History  Diagnosis Date  . Prostate cancer 2008    prostate, Dr. Laverle Patter  . Diverticulosis   . Hypertension   . Syncope     Sick sinus syndrome  . Allergic angioedema     ACE inhibitor  . Sick sinus syndrome     Environmental manager.  Dual chamber pacer placed 02/2011 in [place], [place]  . Influenza A 02/2011     Past Surgical History  Procedure Date  . Eye surgery     cataract (lens implants bilat)  . Tonsillectomy   . Radioactive seed implant     prostate 07/2009.  PSA dropped to below 2 after this (Dr. Gerome Sam at [facility] in [facility])  . 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 Y865 Dual chamber; place in [place], [place] when pt visiting there for holiday 02/2011.  . Transthoracic echocardiogram 02/2011    Normal      Current Outpatient  Prescriptions  Medication Sig Dispense Refill  . Alfalfa 500 MG TABS 9-12 TABS/DAY       . aspirin EC 81 MG tablet Take 81 mg by mouth daily.        . carvedilol (COREG) 25 MG tablet Take 1 tablet (25 mg total) by mouth 2 (two) times daily with a meal.  180 tablet  1  . chlorthalidone (HYGROTON) 25 MG tablet Take 1 tablet (25 mg total) by mouth daily.  90 tablet  1  . cholecalciferol (VITAMIN D) 1000 UNITS tablet Take 1,000 Units by mouth daily.        . NON FORMULARY Take 2 tablets by mouth daily. Prostate Ess plus      . NON FORMULARY Take 3 tablets by mouth daily. osteomatrix       . Omega-3 Fatty Acids (FISH OIL) 1000 MG CAPS Take 1 capsule by mouth 2 (two) times daily.        . potassium chloride SA (K-DUR,KLOR-CON) 20 MEQ tablet Take 6 tablets by mouth once daily.  540 tablet  3  . pravastatin (PRAVACHOL) 20 MG tablet Take 1 tablet (20 mg total) by mouth daily.  90 tablet  1  . vitamin C (ASCORBIC ACID) 500 MG tablet Take 500 mg by mouth daily.        Marland Kitchen DISCONTD: potassium chloride SA (K-DUR,KLOR-CON) 20 MEQ tablet Take 6 tablets by mouth once daily.  540 tablet  1  . DISCONTD: potassium chloride  SA (K-DUR,KLOR-CON) 20 MEQ tablet Take 2 tablets tid      . DISCONTD: potassium chloride SA (K-DUR,KLOR-CON) 20 MEQ tablet Take 6 tablets by mouth once daily.  540 tablet  3     Allergies: Allergies  Allergen Reactions  . Ace Inhibitors     REACTION: Angioedema  . Aspirin     REACTION: Stomach Discomfort     History  Substance Use Topics  . Smoking status: Former Smoker    Quit date: 03/26/1964  . Smokeless tobacco: Never Used  . Alcohol Use: No      Family History  Problem Relation Age of Onset  . Stroke Mother     during endarterectomy  . Cancer Father     colon      ROS:  Please see the history of present illness.    All other systems reviewed and negative.   Vital Signs: Blood pressure 133/77, pulse 72, height 5\' 9"  (1.753 m), weight 171 lb 12.8 oz (77.928  kg).  PHYSICAL EXAM: General:  Well nourished, well developed male in no acute distress HEENT: normal Lymph: no adenopathy Neck: no JVD Endocrine:  No thryomegaly Vascular: No carotid bruits; FA and distal pulses 2+ bilaterally  Cardiac:  normal S1, S2; RRR; no murmur Back: without kyphosis/scoliosis, no CVA tenderness Lungs:  clear to auscultation bilaterally, no wheezing, rhonchi or rales Abd: soft, nontender, no hepatomegaly Ext: no edema Musculoskeletal:  No deformities, BUE and BLE strength normal and equal Skin: warm and dry Neuro:  CNs 2-12 intact, no focal abnormalities noted Psych:  Normal affect   EKG:  Sinus rhythm at 72 Intervals 0.19/0.10/0.38 Otherwise normal  Labs:   Lab Results  Component Value Date   WBC 6.3 11/14/2010   HGB 14.8 11/14/2010   HCT 44.0 11/14/2010   MCV 88.8 11/14/2010   PLT 264.0 11/14/2010   Lab Results  Component Value Date   CHOL 129 11/14/2010   HDL 43.50 11/14/2010   LDLCALC 67 11/14/2010   TRIG 91.0 11/14/2010       ASSESSMENT AND PLAN:

## 2011-05-02 ENCOUNTER — Telehealth: Payer: Self-pay | Admitting: Internal Medicine

## 2011-05-02 NOTE — Telephone Encounter (Signed)
ROI faxed to [facility] @ [phone] 05/02/11/KM

## 2011-05-02 NOTE — Telephone Encounter (Signed)
Notified pt.  He is agreeable.  Lab appt scheduled for 05/17/11.

## 2011-05-02 NOTE — Telephone Encounter (Signed)
Left message for patient to call back  

## 2011-05-02 NOTE — Telephone Encounter (Signed)
Patient is scheduled for 05/15/11.  He is aware of the appt.  I have mailed him the new patient letter and paperwork.

## 2011-05-02 NOTE — Telephone Encounter (Signed)
Records Received from [facility] gave to [facility]  05/02/11/KM

## 2011-05-04 NOTE — Progress Notes (Signed)
Addended by: Judithe Modest D on: 05/04/2011 09:31 AM   Modules accepted: Orders

## 2011-05-15 ENCOUNTER — Ambulatory Visit (INDEPENDENT_AMBULATORY_CARE_PROVIDER_SITE_OTHER): Payer: Medicare Other | Admitting: Internal Medicine

## 2011-05-15 ENCOUNTER — Encounter: Payer: Self-pay | Admitting: Internal Medicine

## 2011-05-15 DIAGNOSIS — R112 Nausea with vomiting, unspecified: Secondary | ICD-10-CM

## 2011-05-15 DIAGNOSIS — Z87898 Personal history of other specified conditions: Secondary | ICD-10-CM

## 2011-05-15 DIAGNOSIS — Z1211 Encounter for screening for malignant neoplasm of colon: Secondary | ICD-10-CM

## 2011-05-15 DIAGNOSIS — R197 Diarrhea, unspecified: Secondary | ICD-10-CM

## 2011-05-15 NOTE — Patient Instructions (Signed)
Dr. Leone Payor has recommended that you have a Colonoscopy, please give [place] a call back to schedule pre-visit and Colonoscopy procedure after discussing with your primary care physician. Dr. Leone Payor will contact you with further plans regarding your symptoms.

## 2011-05-15 NOTE — Progress Notes (Signed)
Subjective:    Patient ID: Billy Burns, male    DOB: [birth year], 71 y.o.   MRN: [id number]  HPI This very pleasant 71 year old white man seen at the request of his cardiologist. The patient has a history of nausea vomiting and diarrhea associated with syncope. In the past year this happened twice. In April, he had sudden onset of nausea and some abdominal discomfort and went to the bathroom and had nausea vomiting and diarrhea and passed out. He was taken to [facility] were evaluation with labs was unrevealing and was told he had gastroenteritis. He recovered from that and did well until December when he was in [place] visiting family. He developed some nausea. He felt bad for maybe up to a day and then had sudden onset of nausea vomiting and diarrhea and had syncope twice. EMS was called and he was taken to the hospital. He was found to have a very long pauses of up to 27 seconds without heartbeat. The pacemaker was placed. In between he has felt well. In the past he has had a diagnosis of angioedema thought due to ACE inhibitors and or metoprolol. He cannot really remember if these problems are similar to that though the records I have show a discharge summary of 2008 showing that he had a syncopal episode with nausea and vomiting. There is a history of intermittent vertigo as well but that has not been associated with these episodes that I am aware.  In 2008 he did have lip swelling, and I think that's how they figured out he had angioedema. He does not have a history of that now. It did not occur with these 2 episodes as far as I can tell. In 2008 his episode occurred within a month or starting lisinopril.  Allergies  Allergen Reactions  . Ace Inhibitors     REACTION: Angioedema  . Aspirin     REACTION: Stomach Discomfort  . Toprol Xl (Metoprolol Succinate)     Angioedema   Outpatient Prescriptions Prior to Visit  Medication Sig Dispense Refill  . Alfalfa 500 MG TABS 9-12 TABS/DAY        . amLODipine (NORVASC) 10 MG tablet Take 1 tablet (10 mg total) by mouth daily.  30 tablet  1  . aspirin EC 81 MG tablet Take 81 mg by mouth daily.        . carvedilol (COREG) 25 MG tablet Take 1 tablet (25 mg total) by mouth 2 (two) times daily with a meal.  180 tablet  1  . cholecalciferol (VITAMIN D) 1000 UNITS tablet Take 1,000 Units by mouth as needed.       . NON FORMULARY Take 2 tablets by mouth daily. Prostate Ess plus      . NON FORMULARY Take 3 tablets by mouth daily. osteomatrix       . Omega-3 Fatty Acids (FISH OIL) 1000 MG CAPS Take 1 capsule by mouth 2 (two) times daily.        . pravastatin (PRAVACHOL) 20 MG tablet Take 1 tablet (20 mg total) by mouth daily.  90 tablet  1  . vitamin C (ASCORBIC ACID) 500 MG tablet Take 500 mg by mouth as needed.        Past Medical History  Diagnosis Date  . Prostate cancer 2008    Dr. Laverle Patter  . Diverticulosis   . Hypertension   . Syncope     ? Malignant vasovagal syncope  . Allergic angioedema  ACE inhibitor/beta blocker  . Sinus arrest   . Influenza A 02/2011  . Intermittent vertigo   . 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 Headache   . Gastroenteritis 2012   Past Surgical History  Procedure Date  . Cataract extraction 1984, 1994    (lens implants bilat)  . Tonsillectomy 1950  . Radioactive seed implant     prostate 07/2009.  PSA dropped to below 2 after this (Dr. Gerome Sam at [facility] in [facility])  . 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 J478 Dual chamber; place in [place], [place] when pt visiting there for holiday 02/2011.  . Transthoracic echocardiogram 02/2011    Normal   History   Social History  . Marital Status: Married    Spouse Name: Kathie Rhodes    Number of Children: 2  . Years of Education: N/A   Occupational History  . Retired     Data processing manager for [employer]   Social History Main Topics  . Smoking status: Former Smoker    Quit date: 03/26/1964  . Smokeless tobacco: Never Used  .  Alcohol Use: No  . Drug Use: No  .            Social History Narrative   Married, retired from [employer] 2004.No T/A/Ds.Walks daily, push mows his yard.   Family History  Problem Relation Age of Onset  . Stroke Mother     during endarterectomy  . Colon cancer Father   . Diabetes Neg Hx         Review of Systems Other for those things mentioned in the history of present illness. He reports that he otherwise feels well so all other review of systems negative at this time.    Objective:   Physical Exam General:  Well-developed, well-nourished and in no acute distress Eyes:  anicteric. ENT:   Mouth and posterior pharynx free of lesions.  Neck:   supple w/o thyromegaly or mass.  Lungs: Clear to auscultation bilaterally. Heart:  S1S2, no rubs, murmurs, gallops. Abdomen:  soft, non-tender, no hepatosplenomegaly, or mass and BS+. + diastasis recti Lymph:  no cervical or supraclavicular adenopathy. Extremities:   Trace LE edema bilat Skin   no rash. Neuro:  A&O x 3.  Psych:  appropriate mood and  Affect.   Data Reviewed:  recent labs, ER visit April 2012, Dr. Odessa Fleming notes 2008 office and hospital notes       Assessment & Plan:   1. Nausea and vomiting   2. Diarrhea   3. History of angioedema   4. Special screening for malignant neoplasms, colon     He had very similar problems in 2008, at the time a diagnosis of angioedema was made. I really wonder if that's not what's going on again and he hasn't had any lip swelling. Will look into further evaluation of that. We will contact him with those recommendations. Other possibilities include some type of recurrent bowel obstruction. He's only had a few events in his life, fortunately. He now has a pacemaker to protect him and help prevent severe vagal related bradycardia and even asystole which he said it sounds like. One possible plan would be to wait for a recurrence and try to get imaging of the abdominal and pelvic  areas to see if there are any clues as to what would be the cause of his rare episodes like this. We'll keep that in mind depending upon the angioedema evaluation. I am not  sure he ever had a serologic evaluation that could be needed.  There is a remote history of vertigo though he does not give a history of that now. He did not give a history that previously. So another consideration would be to evaluate that further with something like an ear nose and throat evaluation but I doubt he would've had diarrhea related to that.  I recommended a routine screening colonoscopy he wants to discuss that with his primary care physician. It's been over 10 years disease had one. He will let me know.

## 2011-05-17 ENCOUNTER — Other Ambulatory Visit (INDEPENDENT_AMBULATORY_CARE_PROVIDER_SITE_OTHER): Payer: Medicare Other

## 2011-05-17 DIAGNOSIS — E876 Hypokalemia: Secondary | ICD-10-CM

## 2011-05-17 DIAGNOSIS — I10 Essential (primary) hypertension: Secondary | ICD-10-CM

## 2011-05-17 LAB — BASIC METABOLIC PANEL
Chloride: 103 mEq/L (ref 96–112)
Creatinine, Ser: 1 mg/dL (ref 0.4–1.5)

## 2011-05-18 ENCOUNTER — Telehealth: Payer: Self-pay

## 2011-05-18 DIAGNOSIS — T783XXA Angioneurotic edema, initial encounter: Secondary | ICD-10-CM

## 2011-05-18 NOTE — Telephone Encounter (Signed)
I have left a message for the patient to call back to discuss referral to Dr Lucie Leather 06-05-11 1:30.  He needs to be off all antihistamines for 3 days prior to the appt, bring a photo ID, insurance cards, and co-pay to the appt.  Street address is [address].  They are sending him paperwork.

## 2011-05-21 NOTE — Telephone Encounter (Signed)
Patient aware of appt date and time for Dr Lucie Leather.  He also scheduled colon for 05/29/11 and a pre-visit for 05/24/11

## 2011-05-21 NOTE — Telephone Encounter (Signed)
Left message for patient to call back  

## 2011-05-23 ENCOUNTER — Ambulatory Visit (AMBULATORY_SURGERY_CENTER): Payer: Medicare Other | Admitting: *Deleted

## 2011-05-23 ENCOUNTER — Encounter: Payer: Self-pay | Admitting: Internal Medicine

## 2011-05-23 VITALS — Ht 69.0 in | Wt 179.9 lb

## 2011-05-23 DIAGNOSIS — Z1211 Encounter for screening for malignant neoplasm of colon: Secondary | ICD-10-CM

## 2011-05-23 MED ORDER — MOVIPREP 100 G PO SOLR: ORAL | Status: DC

## 2011-05-29 ENCOUNTER — Encounter: Payer: Self-pay | Admitting: Internal Medicine

## 2011-05-29 ENCOUNTER — Ambulatory Visit (AMBULATORY_SURGERY_CENTER): Payer: Medicare Other | Admitting: Internal Medicine

## 2011-05-29 VITALS — BP 102/64 | HR 88 | Temp 96.0°F | Resp 16 | Ht 69.0 in | Wt 179.0 lb

## 2011-05-29 DIAGNOSIS — D126 Benign neoplasm of colon, unspecified: Secondary | ICD-10-CM

## 2011-05-29 DIAGNOSIS — Z1211 Encounter for screening for malignant neoplasm of colon: Secondary | ICD-10-CM

## 2011-05-29 DIAGNOSIS — K5730 Diverticulosis of large intestine without perforation or abscess without bleeding: Secondary | ICD-10-CM

## 2011-05-29 DIAGNOSIS — K648 Other hemorrhoids: Secondary | ICD-10-CM

## 2011-05-29 MED ORDER — SODIUM CHLORIDE 0.9 % IV SOLN: 500.0000 mL | INTRAVENOUS | Status: DC

## 2011-05-29 NOTE — Progress Notes (Signed)
Patient did not experience any of the following events: a burn prior to discharge; a fall within the facility; wrong site/side/patient/procedure/implant event; or a hospital transfer or hospital admission upon discharge from the facility. (G8907) Patient did not have preoperative order for IV antibiotic SSI prophylaxis. (G8918)  

## 2011-05-29 NOTE — Patient Instructions (Signed)
YOU HAD AN ENDOSCOPIC PROCEDURE TODAY AT THE [facility]: Refer to the procedure report that was given to you for any specific questions about what was found during the examination.  If the procedure report does not answer your questions, please call your gastroenterologist to clarify.  If you requested that your care partner not be given the details of your procedure findings, then the procedure report has been included in a sealed envelope for you to review at your convenience later.  YOU SHOULD EXPECT: Some feelings of bloating in the abdomen. Passage of more gas than usual.  Walking can help get rid of the air that was put into your GI tract during the procedure and reduce the bloating. If you had a lower endoscopy (such as a colonoscopy or flexible sigmoidoscopy) you may notice spotting of blood in your stool or on the toilet paper. If you underwent a bowel prep for your procedure, then you may not have a normal bowel movement for a few days.  DIET: Your first meal following the procedure should be a light meal and then it is ok to progress to your normal diet.  A half-sandwich or bowl of soup is an example of a good first meal.  Heavy or fried foods are harder to digest and may make you feel nauseous or bloated.  Likewise meals heavy in dairy and vegetables can cause extra gas to form and this can also increase the bloating.  Drink plenty of fluids but you should avoid alcoholic beverages for 24 hours.  ACTIVITY: Your care partner should take you home directly after the procedure.  You should plan to take it easy, moving slowly for the rest of the day.  You can resume normal activity the day after the procedure however you should NOT DRIVE or use heavy machinery for 24 hours (because of the sedation medicines used during the test).    SYMPTOMS TO REPORT IMMEDIATELY: A gastroenterologist can be reached at any hour.  During normal business hours, 8:30 AM to 5:00 PM Monday through Friday,  call [phone].  After hours and on weekends, please call the GI answering service at [phone] who will take a message and have the physician on call contact you.   Following lower endoscopy (colonoscopy or flexible sigmoidoscopy):  Excessive amounts of blood in the stool  Significant tenderness or worsening of abdominal pains  Swelling of the abdomen that is new, acute  Fever of 100F or higher  Black, tarry-looking stools  FOLLOW UP: If any biopsies were taken you will be contacted by phone or by letter within the next 1-3 weeks.  Call your gastroenterologist if you have not heard about the biopsies in 3 weeks.  Our staff will call the home number listed on your records the next business day following your procedure to check on you and address any questions or concerns that you may have at that time regarding the information given to you following your procedure. This is a courtesy call and so if there is no answer at the home number and we have not heard from you through the emergency physician on call, we will assume that you have returned to your regular daily activities without incident.  SIGNATURES/CONFIDENTIALITY: You and/or your care partner have signed paperwork which will be entered into your electronic medical record.  These signatures attest to the fact that that the information above on your After Visit Summary has been reviewed and is understood.  Full responsibility of   the confidentiality of this discharge information lies with you and/or your care-partner.  

## 2011-05-29 NOTE — Progress Notes (Signed)
PT STATES HIS PACEMAKER WAS PLACED March 22, 2011. COPY OF CARD IN PT'S CHART. EWM

## 2011-05-29 NOTE — Op Note (Signed)
[facility] [address] [place], [place]  [address]  COLONOSCOPY PROCEDURE REPORT  PATIENT:  Billy Burns, Billy Burns  MR#:  [id number] BIRTHDATE:  [birth year], 71 yrs. old  GENDER:  male ENDOSCOPIST:  Iva Boop, MD, [facility] REF. BY:  Earley Favor, M.D. PROCEDURE DATE:  05/29/2011 PROCEDURE:  Colonoscopy with biopsy and snare polypectomy ASA CLASS:  Class II INDICATIONS:  Routine Risk Screening MEDICATIONS:   These medications were titrated to patient response per physician's verbal order, Fentanyl 75 mcg, Versed 6 mg IV  DESCRIPTION OF PROCEDURE:   After the risks benefits and alternatives of the procedure were thoroughly explained, informed consent was obtained.  Digital rectal exam was performed and revealed no rectal masses.   The LB 180AL [id number] endoscope was introduced through the anus and advanced to the cecum, which was identified by both the appendix and ileocecal valve, without limitations.  The quality of the prep was excellent, using MoviPrep.  The instrument was then slowly withdrawn as the colon was fully examined. <<PROCEDUREIMAGES>>  FINDINGS:  Three polyps were found. Two diminutive 1-2 mm ascending polyps (cold biopsy removal) and a 6 mm sigmoid polyp (cold snare removal).  Moderate diverticulosis was found throughout the colon.  This was otherwise a normal examination of the colon.   Retroflexed views in the rectum revealed internal hemorrhoids.    The time to cecum = 2:15 minutes. The scope was then withdrawn in 15:14 minutes from the cecum and the procedure completed. COMPLICATIONS:  None ENDOSCOPIC IMPRESSION: 1) Three polyps removed, largest 6 mm 2) Moderate diverticulosis throughout the colon 3) Internal hemorrhoids 4) Otherwise normal examination - excellent prep  REPEAT EXAM:  In for Colonoscopy, pending biopsy results.  Iva Boop, MD, [facility]  CC:  Earley Favor, MD and The Patient  n. eSIGNED:   Iva Boop at 05/29/2011  04:45 PM  Berna Bue, [phone]

## 2011-05-30 ENCOUNTER — Telehealth: Payer: Self-pay | Admitting: *Deleted

## 2011-05-30 NOTE — Telephone Encounter (Signed)
  Follow up Call-  Call back number 05/29/2011  Post procedure Call Back phone  #  Caleen Essex [phone]  Permission to leave phone message Yes     Patient questions:  Do you have a fever, pain , or abdominal swelling? no Pain Score  0 *  Have you tolerated food without any problems? yes  Have you been able to return to your normal activities? yes  Do you have any questions about your discharge instructions: Diet   no Medications  no Follow up visit  no  Do you have questions or concerns about your Care? no  Actions: * If pain score is 4 or above: No action needed, pain <4.

## 2011-06-05 ENCOUNTER — Encounter: Payer: Self-pay | Admitting: Internal Medicine

## 2011-06-05 DIAGNOSIS — Z860100 Personal history of colon polyps, unspecified: Secondary | ICD-10-CM | POA: Insufficient documentation

## 2011-06-05 DIAGNOSIS — D126 Benign neoplasm of colon, unspecified: Secondary | ICD-10-CM

## 2011-06-05 HISTORY — DX: Benign neoplasm of colon, unspecified: D12.6

## 2011-06-05 HISTORY — PX: COLONOSCOPY: SHX174

## 2011-06-05 NOTE — Progress Notes (Signed)
Quick Note:  Sigmoid adenoma 6mm Other polyps not true polyps  Routine repeat colonoscopy approx 05/2016 ______

## 2011-06-06 ENCOUNTER — Encounter: Payer: Self-pay | Admitting: Family Medicine

## 2011-06-08 ENCOUNTER — Other Ambulatory Visit: Payer: Self-pay | Admitting: *Deleted

## 2011-06-08 DIAGNOSIS — E876 Hypokalemia: Secondary | ICD-10-CM

## 2011-06-08 DIAGNOSIS — I10 Essential (primary) hypertension: Secondary | ICD-10-CM

## 2011-06-08 MED ORDER — AMLODIPINE BESYLATE 10 MG PO TABS: 10.0000 mg | ORAL_TABLET | Freq: Every day | ORAL | Status: DC

## 2011-06-08 NOTE — Telephone Encounter (Signed)
Faxed refill request received from pharmacy for Amlodipine Last seen on 03/30/11  Follow up needed in 06/2011.  RX sent.

## 2011-06-18 ENCOUNTER — Other Ambulatory Visit: Payer: Medicare Other | Admitting: Internal Medicine

## 2011-07-11 ENCOUNTER — Encounter: Payer: Self-pay | Admitting: Internal Medicine

## 2011-07-11 ENCOUNTER — Ambulatory Visit (INDEPENDENT_AMBULATORY_CARE_PROVIDER_SITE_OTHER): Payer: Medicare Other | Admitting: *Deleted

## 2011-07-11 DIAGNOSIS — I495 Sick sinus syndrome: Secondary | ICD-10-CM

## 2011-07-11 LAB — PACEMAKER DEVICE OBSERVATION
AL AMPLITUDE: 7.1 mv
AL THRESHOLD: 0.9 V
ATRIAL PACING PM: 20
DEVICE MODEL PM: 120835
RV LEAD AMPLITUDE: 16.3 mv
RV LEAD THRESHOLD: 0.7 V

## 2011-07-11 NOTE — Progress Notes (Signed)
PPM check 

## 2011-07-12 ENCOUNTER — Ambulatory Visit (INDEPENDENT_AMBULATORY_CARE_PROVIDER_SITE_OTHER): Payer: Medicare Other | Admitting: Family Medicine

## 2011-07-12 ENCOUNTER — Encounter: Payer: Self-pay | Admitting: Family Medicine

## 2011-07-12 VITALS — BP 136/71 | HR 72 | Temp 98.3°F | Ht 69.5 in | Wt 181.0 lb

## 2011-07-12 DIAGNOSIS — I10 Essential (primary) hypertension: Secondary | ICD-10-CM

## 2011-07-12 DIAGNOSIS — R609 Edema, unspecified: Secondary | ICD-10-CM

## 2011-07-12 MED ORDER — CLONIDINE HCL 0.1 MG PO TABS: 0.1000 mg | ORAL_TABLET | Freq: Two times a day (BID) | ORAL | Status: DC

## 2011-07-12 NOTE — Assessment & Plan Note (Signed)
D/c amlodipine. Start clonidine 0.1mg  bid. Check bp qd x 2 wks and call if not persistently at goal of < 140/90. Recheck in office 1 mo.

## 2011-07-12 NOTE — Progress Notes (Signed)
OFFICE NOTE  07/12/2011  CC:  Chief Complaint  Patient presents with  . Leg Pain    bilateral, calf/ankle swelling     HPI: Patient is a 71 y.o. Caucasian male who is here for bilat legs swelling.  Mild discomfort with itching-this responded to hydrocortisone cream. Last couple of weeks.  Started about 1 wk after changing bp med to amlodipine. BP has been good.  He feels good. Saw allergist regarding possibility of angioedema being the cause of his spells of syncope/brady/arrest--pt can tell me that he was offered clarinex and epi pen but pt did not get either of these--says he didn't sound convinced that anything concrete was detected/decided.  F/u at Dr. Odessa Fleming this week was a good report regarding his pacemaker.   Pertinent PMH:  Past Medical History  Diagnosis Date  . Prostate cancer 2008    Dr. Laverle Patter  . Diverticulosis   . Hypertension   . Syncope     ? Malignant vasovagal syncope  . Allergic angioedema     ACE inhibitor/beta blocker  . Sinus arrest   . Influenza A 02/2011  . Intermittent vertigo   . 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 Headache   . Gastroenteritis 2012  . Adenomatous colon polyp 06/05/11    Repeat 2018 per Dr. Leone Payor    MEDS:  Outpatient Prescriptions Prior to Visit  Medication Sig Dispense Refill  . Alfalfa 500 MG TABS 9-12 TABS/DAY       . amLODipine (NORVASC) 10 MG tablet Take 1 tablet (10 mg total) by mouth daily.  90 tablet  1  . aspirin EC 81 MG tablet Take 81 mg by mouth daily.        . carvedilol (COREG) 25 MG tablet Take 1 tablet (25 mg total) by mouth 2 (two) times daily with a meal.  180 tablet  1  . cholecalciferol (VITAMIN D) 1000 UNITS tablet Take 1,000 Units by mouth as needed.       . NON FORMULARY Take 2 tablets by mouth daily. Prostate Ess plus      . NON FORMULARY Take 3 tablets by mouth daily. osteomatrix       . Omega-3 Fatty Acids (FISH OIL) 1000 MG CAPS Take 1 capsule by mouth 2 (two) times daily.        . pravastatin  (PRAVACHOL) 20 MG tablet Take 1 tablet (20 mg total) by mouth daily.  90 tablet  1  . vitamin C (ASCORBIC ACID) 500 MG tablet Take 500 mg by mouth as needed.       Marland Kitchen acetaminophen (TYLENOL) 325 MG tablet Take 650 mg by mouth as needed.        PE: Blood pressure 136/71, pulse 72, temperature 98.3 F (36.8 C), temperature source Temporal, height 5' 9.5" (1.765 m), weight 181 lb (82.101 kg), SpO2 96.00%. Gen: Alert, well appearing.  Patient is oriented to person, place, time, and situation. CV: RRR, no m/r/g.   LUNGS: CTA bilat, nonlabored resps, good aeration in all lung fields. EXT: 2+ pitting edema from just below knee cap down into feet.  Nontender.  No rash.  DP and PT pulses 2+ bilat.  IMPRESSION AND PLAN:  Peripheral edema D/c amlodipine. Start clonidine 0.1mg  bid. Check bp qd x 2 wks and call if not persistently at goal of < 140/90. Recheck in office 1 mo.      FOLLOW UP: 1 mo

## 2011-08-06 ENCOUNTER — Encounter: Payer: Self-pay | Admitting: Family Medicine

## 2011-08-06 ENCOUNTER — Ambulatory Visit (INDEPENDENT_AMBULATORY_CARE_PROVIDER_SITE_OTHER): Payer: Medicare Other | Admitting: Family Medicine

## 2011-08-06 VITALS — BP 189/87 | HR 67 | Ht 69.5 in | Wt 179.0 lb

## 2011-08-06 DIAGNOSIS — H8110 Benign paroxysmal vertigo, unspecified ear: Secondary | ICD-10-CM

## 2011-08-06 DIAGNOSIS — I10 Essential (primary) hypertension: Secondary | ICD-10-CM

## 2011-08-06 DIAGNOSIS — R609 Edema, unspecified: Secondary | ICD-10-CM

## 2011-08-06 NOTE — Assessment & Plan Note (Signed)
Gave Epley;s maneuver handout in office and explained how to do these. Hopefully these will fully extinguish his vertigo episodes.

## 2011-08-06 NOTE — Progress Notes (Signed)
OFFICE NOTE  08/06/2011  CC:  Chief Complaint  Patient presents with  . Follow-up    meds, also having dizziness     HPI: Patient is a 71 y.o. Caucasian male who is here for 3 wk f/u for bp med problems. Switched from amlodipine to clonidine last visit due to LE edema onset after starting amlodipine. BP's reviewed since change: 4/19-5/11 all normal, HRs 50s-70. Says swelling has improved quite a bit.  Says he has had some on/off problem with feeling sense of room moving/spinning with rolling over in bed to right.  Last only seconds, feels a bit sickish but no emesis.  Doesn't occur every time he rolls to the right. No recent fevers, no sense of viral illness lately.   Pertinent PMH:  Past Medical History  Diagnosis Date  . Prostate cancer 2008    Dr. Laverle Patter  . Diverticulosis   . Hypertension   . Syncope     ? Malignant vasovagal syncope  . Allergic angioedema     ACE inhibitor/beta blocker  . Sinus arrest   . Influenza A 02/2011  . Intermittent vertigo   . 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 Headache   . Gastroenteritis 2012  . Adenomatous colon polyp 06/05/11    Repeat 2018 per Dr. Leone Payor    MEDS:  Outpatient Prescriptions Prior to Visit  Medication Sig Dispense Refill  . acetaminophen (TYLENOL) 325 MG tablet Take 650 mg by mouth as needed.      . Alfalfa 500 MG TABS 9-12 TABS/DAY       . aspirin EC 81 MG tablet Take 81 mg by mouth daily.        . carvedilol (COREG) 25 MG tablet Take 1 tablet (25 mg total) by mouth 2 (two) times daily with a meal.  180 tablet  1  . cholecalciferol (VITAMIN D) 1000 UNITS tablet Take 1,000 Units by mouth as needed.       . cloNIDine (CATAPRES) 0.1 MG tablet Take 1 tablet (0.1 mg total) by mouth 2 (two) times daily.  60 tablet  3  . NON FORMULARY Take 2 tablets by mouth daily. Prostate Ess plus      . NON FORMULARY Take 3 tablets by mouth daily. osteomatrix       . Omega-3 Fatty Acids (FISH OIL) 1000 MG CAPS Take 1 capsule by mouth 2 (two)  times daily.        . pravastatin (PRAVACHOL) 20 MG tablet Take 1 tablet (20 mg total) by mouth daily.  90 tablet  1  . vitamin C (ASCORBIC ACID) 500 MG tablet Take 500 mg by mouth as needed.         PE: Blood pressure 189/87, pulse 67, height 5' 9.5" (1.765 m), weight 179 lb (81.194 kg). Gen: Alert, well appearing.  Patient is oriented to person, place, time, and situation. ENT: PERRLA, EOMI. Dix-Halpike maneuver neg with head turned to right, no nystagmus. No sx's occurred when he tried to induce the sx's himselft with rolling over to the right with body supine. LEGS: 2+ edema on right, 1+ on left.  No erythema, rash, or tenderness.  IMPRESSION AND PLAN:  HYPERTENSION Problem stable.  Continue current medications (carvedolol and clonidine) and diet appropriate for this condition.  We have reviewed our general long term plan for this problem and also reviewed symptoms and signs that should prompt the patient to call or return to the office. F/u 13mo.  Peripheral edema Improving off  of amlodipine. Continue to watch Na intake, elevated prn.  BPPV (benign paroxysmal positional vertigo) Gave Epley;s maneuver handout in office and explained how to do these. Hopefully these will fully extinguish his vertigo episodes.      FOLLOW UP: 33mo

## 2011-08-06 NOTE — Assessment & Plan Note (Signed)
Improving off of amlodipine. Continue to watch Na intake, elevated prn.

## 2011-08-06 NOTE — Assessment & Plan Note (Signed)
Problem stable.  Continue current medications (carvedolol and clonidine) and diet appropriate for this condition.  We have reviewed our general long term plan for this problem and also reviewed symptoms and signs that should prompt the patient to call or return to the office. F/u 80mo.

## 2011-08-22 ENCOUNTER — Ambulatory Visit: Payer: Medicare Other | Admitting: Family Medicine

## 2011-08-28 ENCOUNTER — Encounter: Payer: Self-pay | Admitting: Internal Medicine

## 2011-08-28 ENCOUNTER — Ambulatory Visit (INDEPENDENT_AMBULATORY_CARE_PROVIDER_SITE_OTHER): Payer: Medicare Other | Admitting: Internal Medicine

## 2011-08-28 VITALS — BP 150/82 | HR 80 | Ht 69.0 in | Wt 176.4 lb

## 2011-08-28 DIAGNOSIS — R55 Syncope and collapse: Secondary | ICD-10-CM

## 2011-08-28 DIAGNOSIS — Z950 Presence of cardiac pacemaker: Secondary | ICD-10-CM

## 2011-08-28 DIAGNOSIS — I10 Essential (primary) hypertension: Secondary | ICD-10-CM

## 2011-08-28 DIAGNOSIS — I495 Sick sinus syndrome: Secondary | ICD-10-CM

## 2011-08-28 LAB — PACEMAKER DEVICE OBSERVATION
AL IMPEDENCE PM: 470 Ohm
ATRIAL PACING PM: 36
RV LEAD IMPEDENCE PM: 636 Ohm
RV LEAD THRESHOLD: 0.6 V

## 2011-08-28 NOTE — Assessment & Plan Note (Signed)
The patient's device was interrogated and the information was fully reviewed.  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

## 2011-08-28 NOTE — Patient Instructions (Addendum)
Your physician wants you to follow-up in: 6 months with device clinic. You will receive a reminder letter in the mail two months in advance. If you don't receive a letter, please call our office to schedule the follow-up appointment. Your physician recommends that you continue on your current medications as directed. Please refer to the Current Medication list given to you today.  

## 2011-08-28 NOTE — Progress Notes (Signed)
HPI  Billy Burns is a 71 y.o. 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  Because of the likelihood that there was a GI trigger given the associated symptoms, he was to be seen by GI. No specific finding was noted. He underwent a colonoscopy that was negative.  He has had no intercurrent lightheaded spells.  .  Otherwise he  denies SOB, chest pain edema or palpitations.  There has been no syncope or presyncope.   Past Medical History  Diagnosis Date  . Prostate cancer 2008    Dr. Laverle Patter  . Diverticulosis   . Hypertension   . Syncope     ? Malignant vasovagal syncope  . Allergic angioedema     ACE inhibitor/beta blocker  . Sinus arrest   . Influenza A 02/2011  . Intermittent vertigo   . 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 Headache   . Gastroenteritis 2012  . Adenomatous colon polyp 06/05/11    Repeat 2018 per Dr. Leone Payor    Past Surgical History  Procedure Date  . Cataract extraction 1984, 1994    (lens implants bilat)  . Tonsillectomy 1950  . Radioactive seed implant     prostate 07/2009.  PSA dropped to below 2 after this (Dr. Gerome Sam at [facility] in [facility])  . 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 Z610 Dual chamber; place in [place], [place] when pt visiting there for holiday 02/2011.  . Transthoracic echocardiogram 02/2011    Normal  . Colonoscopy 06/05/11    6mm sigmoid polyp removed (+adenomatous), moderate diverticulosis, internal hemorrhoids.  Repeat 2018.    Current Outpatient Prescriptions  Medication Sig Dispense Refill  . Alfalfa 500 MG TABS 9-12 TABS/DAY       . aspirin EC 81 MG tablet Take 81 mg by mouth daily.        . carvedilol (COREG) 25 MG tablet Take 1 tablet (25 mg total) by mouth 2 (two) times daily with a meal.  180 tablet  1  . cholecalciferol (VITAMIN D) 1000 UNITS tablet Take 1,000 Units by mouth as needed.       . cloNIDine (CATAPRES) 0.1 MG  tablet Take 1 tablet (0.1 mg total) by mouth 2 (two) times daily.  60 tablet  3  . NON FORMULARY Take 2 tablets by mouth daily. Prostate Ess plus      . NON FORMULARY Take 3 tablets by mouth daily. osteomatrix       . Omega-3 Fatty Acids (FISH OIL) 1000 MG CAPS Take 1 capsule by mouth 2 (two) times daily.        . pravastatin (PRAVACHOL) 20 MG tablet Take 1 tablet (20 mg total) by mouth daily.  90 tablet  1  . vitamin C (ASCORBIC ACID) 500 MG tablet Take 500 mg by mouth as needed.       Marland Kitchen acetaminophen (TYLENOL) 325 MG tablet Take 650 mg by mouth as needed.        Allergies  Allergen Reactions  . Ace Inhibitors Other (See Comments)    REACTION: Angioedema  . Aspirin Other (See Comments)    REACTION: Stomach Discomfort/TAKES 81 MG BUT CANT TAKE 325 MG ASA PER PT.  . Toprol Xl (Metoprolol Succinate) Other (See Comments)    Angioedema    Review of Systems negative except from HPI and PMH  Physical Exam BP 150/82  Pulse 80  Ht 5\' 9"  (1.753 m)  Wt 176 lb 6.4 oz (80.015 kg)  BMI 26.05 kg/m2 Well developed and well nourished in no acute distress HENT normal E scleral and icterus clear Neck Supple JVP flat; carotids brisk and full Clear to ausculation Regular rate and rhythm, no murmurs gallops or rub Soft with active bowel sounds No clubbing cyanosis none Edema Alert and oriented, grossly normal motor and sensory function Skin Warm and Dry    Assessment and  Plan

## 2011-08-28 NOTE — Assessment & Plan Note (Signed)
No recurrent syncope 

## 2011-08-28 NOTE — Assessment & Plan Note (Signed)
Still lelevated. We will continue to followup with his PCP

## 2011-11-12 ENCOUNTER — Other Ambulatory Visit: Payer: Self-pay

## 2011-11-12 MED ORDER — CARVEDILOL 25 MG PO TABS: 25.0000 mg | ORAL_TABLET | Freq: Two times a day (BID) | ORAL | Status: DC

## 2011-11-12 MED ORDER — PRAVASTATIN SODIUM 20 MG PO TABS: 20.0000 mg | ORAL_TABLET | Freq: Every day | ORAL | Status: DC

## 2012-01-02 ENCOUNTER — Other Ambulatory Visit: Payer: Self-pay | Admitting: *Deleted

## 2012-01-02 MED ORDER — CLONIDINE HCL 0.1 MG PO TABS: 0.1000 mg | ORAL_TABLET | Freq: Two times a day (BID) | ORAL | Status: DC

## 2012-01-02 NOTE — Telephone Encounter (Signed)
Faxed refill request received from pharmacy for CLONIDINE Last filled by MD on 07/12/11, #60 X 3 Last seen on 08/06/11 Follow up 6 MONTHS RX sent.

## 2012-01-04 ENCOUNTER — Encounter: Payer: Self-pay | Admitting: Family Medicine

## 2012-01-04 ENCOUNTER — Ambulatory Visit (INDEPENDENT_AMBULATORY_CARE_PROVIDER_SITE_OTHER): Payer: Medicare Other | Admitting: Family Medicine

## 2012-01-04 VITALS — BP 186/79 | HR 71 | Temp 97.6°F | Ht 69.0 in | Wt 175.1 lb

## 2012-01-04 DIAGNOSIS — R319 Hematuria, unspecified: Secondary | ICD-10-CM

## 2012-01-04 DIAGNOSIS — I10 Essential (primary) hypertension: Secondary | ICD-10-CM

## 2012-01-04 DIAGNOSIS — R55 Syncope and collapse: Secondary | ICD-10-CM

## 2012-01-04 DIAGNOSIS — H8110 Benign paroxysmal vertigo, unspecified ear: Secondary | ICD-10-CM

## 2012-01-04 DIAGNOSIS — R351 Nocturia: Secondary | ICD-10-CM

## 2012-01-04 LAB — POCT URINALYSIS DIPSTICK
Bilirubin, UA: NEGATIVE
Glucose, UA: NEGATIVE
Ketones, UA: NEGATIVE
Nitrite, UA: NEGATIVE
Spec Grav, UA: 1.015

## 2012-01-04 LAB — CBC
HCT: 41.8 % (ref 39.0–52.0)
Hemoglobin: 14.6 g/dL (ref 13.0–17.0)
MCV: 84.4 fL (ref 78.0–100.0)
WBC: 8.3 10*3/uL (ref 4.0–10.5)

## 2012-01-04 MED ORDER — CLONIDINE HCL 0.1 MG/24HR TD PTWK: 1.0000 | MEDICATED_PATCH | TRANSDERMAL | Status: DC

## 2012-01-04 NOTE — Patient Instructions (Addendum)

## 2012-01-05 LAB — TSH: TSH: 1.205 u[IU]/mL (ref 0.350–4.500)

## 2012-01-05 LAB — RENAL FUNCTION PANEL
BUN: 14 mg/dL (ref 6–23)
Chloride: 102 mEq/L (ref 96–112)
Glucose, Bld: 90 mg/dL (ref 70–99)
Potassium: 3.9 mEq/L (ref 3.5–5.3)

## 2012-01-06 ENCOUNTER — Encounter: Payer: Self-pay | Admitting: Family Medicine

## 2012-01-06 DIAGNOSIS — R319 Hematuria, unspecified: Secondary | ICD-10-CM | POA: Insufficient documentation

## 2012-01-06 DIAGNOSIS — R3129 Other microscopic hematuria: Secondary | ICD-10-CM

## 2012-01-06 HISTORY — DX: Other microscopic hematuria: R31.29

## 2012-01-06 LAB — URINE CULTURE: Organism ID, Bacteria: NO GROWTH

## 2012-01-06 NOTE — Assessment & Plan Note (Signed)
Patient reports this is long standing and previously worked up, urine culture negative

## 2012-01-06 NOTE — Assessment & Plan Note (Signed)
Had a syncopal episode after coitus yesterday. He has felt light headed and ct of sorts somewhat since then. Denies any HA or other neurologic c/o now but did have a slight HA right after the episode. They are headed out of town to clean up his mother in laws house. Labs normal today. He is offered reassurance today and instructed to seek immediate care if he has symptoms that are worrisome to him. Encouraged good hydration and need to arise slowly.

## 2012-01-06 NOTE — Assessment & Plan Note (Signed)
Has not worsened with this episode.

## 2012-01-06 NOTE — Progress Notes (Signed)
Patient ID: Islam Dellacroce, male   DOB: 1940/10/26, 71 y.o.   MRN: [id number] Codylee Bajaj [id number] [birth year] 01/06/2012      Progress Note-Follow Up  Subjective  Chief Complaint  Chief Complaint  Patient presents with  . had feelings like he was going to pass out    last night    HPI  Patient is a 71 year old Caucasian male who is in today for evaluation of a syncopal episode. Last night after having coitus with his wife he had a syncopal episode. He did not have any chest pain or palpitations. He did not have any postictal state with confusion. He did not have any headache leading up to it although a little after the syncopal episode he did have a mild headache but that is resolved now. He denies other neurologic complaints such as vision or hearing changes. No slurring of speech or confusion. No numbness tingling or weakness in the extremities. He denies any chest pain or palpitations during this episode or since or prior. No recent illness, fevers chills. He just says he felt somewhat weak and out of sorts since this occurred. They're headed out of town to clean up the house of his recently deceased mother in [place]. He notes he felt somewhat like he did before he required a pacemaker to be placed with fatigue.  Past Medical History  Diagnosis Date  . Prostate cancer 2008    Dr. Laverle Patter  . Diverticulosis   . Hypertension   . Syncope     ? Malignant vasovagal syncope  . Allergic angioedema     ACE inhibitor/beta blocker  . Sinus arrest   . Influenza A 02/2011  . Intermittent vertigo   . 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 Headache   . Gastroenteritis 2012  . Adenomatous colon polyp 06/05/11    Repeat 2018 per Dr. Leone Payor  . Hematuria 01/06/2012    Past Surgical History  Procedure Date  . Cataract extraction 1984, 1994    (lens implants bilat)  . Tonsillectomy 1950  . Radioactive seed implant     prostate 07/2009.  PSA dropped to below 2 after this (Dr. Gerome Sam at [facility] in [facility])  . 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 N829 Dual chamber; place in [place], [place] when pt visiting there for holiday 02/2011.  . Transthoracic echocardiogram 02/2011    Normal  . Colonoscopy 06/05/11    6mm sigmoid polyp removed (+adenomatous), moderate diverticulosis, internal hemorrhoids.  Repeat 2018.    Family History  Problem Relation Age of Onset  . Stroke Mother     during endarterectomy  . Colon cancer Father   . Diabetes Neg Hx     History   Social History  . Marital Status: Married    Spouse Name: Kathie Rhodes    Number of Children: 2  . Years of Education: N/A   Occupational History  . Retired     Data processing manager for [employer]   Social History Main Topics  . Smoking status: Former Smoker    Quit date: 03/26/1964  . Smokeless tobacco: Never Used  . Alcohol Use: No  . Drug Use: No  . Sexually Active: Not on file   Other Topics Concern  . Not on file   Social History Narrative   Married, retired from [employer] 2004.No T/A/Ds.Walks daily, push mows his yard.    Current Outpatient Prescriptions on File Prior to Visit  Medication Sig Dispense Refill  .  acetaminophen (TYLENOL) 325 MG tablet Take 650 mg by mouth as needed.      . Alfalfa 500 MG TABS 9-12 TABS/DAY       . aspirin EC 81 MG tablet Take 81 mg by mouth daily.        . carvedilol (COREG) 25 MG tablet Take 1 tablet (25 mg total) by mouth 2 (two) times daily with a meal.  180 tablet  1  . cholecalciferol (VITAMIN D) 1000 UNITS tablet Take 1,000 Units by mouth as needed.       . cloNIDine (CATAPRES) 0.1 MG tablet Take 1 tablet (0.1 mg total) by mouth 2 (two) times daily.  180 tablet  0  . NON FORMULARY Take 2 tablets by mouth daily. Prostate Ess plus      . NON FORMULARY Take 3 tablets by mouth daily. osteomatrix       . Omega-3 Fatty Acids (FISH OIL) 1000 MG CAPS Take 1 capsule by mouth 2 (two) times daily.        . pravastatin (PRAVACHOL) 20 MG tablet Take 1 tablet (20  mg total) by mouth daily.  90 tablet  1  . vitamin C (ASCORBIC ACID) 500 MG tablet Take 500 mg by mouth as needed.         Allergies  Allergen Reactions  . Ace Inhibitors Other (See Comments)    REACTION: Angioedema  . Aspirin Other (See Comments)    REACTION: Stomach Discomfort/TAKES 81 MG BUT CANT TAKE 325 MG ASA PER PT.  . Toprol Xl (Metoprolol Succinate) Other (See Comments)    Angioedema    Review of Systems  Review of Systems  Constitutional: Negative for fever and malaise/fatigue.  HENT: Negative for congestion.   Eyes: Negative for discharge.  Respiratory: Negative for shortness of breath.   Cardiovascular: Negative for chest pain, palpitations and leg swelling.  Gastrointestinal: Negative for nausea, abdominal pain and diarrhea.  Genitourinary: Negative for dysuria.  Musculoskeletal: Negative for falls.  Skin: Negative for rash.  Neurological: Positive for dizziness, loss of consciousness and headaches. Negative for tingling, tremors, sensory change, speech change, focal weakness and seizures.  Endo/Heme/Allergies: Negative for polydipsia.  Psychiatric/Behavioral: Negative for depression, suicidal ideas and memory loss. The patient is not nervous/anxious and does not have insomnia.     Objective  BP 186/79  Pulse 71  Temp 97.6 F (36.4 C) (Temporal)  Ht 5\' 9"  (1.753 m)  Wt 175 lb 1.9 oz (79.434 kg)  BMI 25.86 kg/m2  SpO2 97%  Physical Exam  Physical Exam  Constitutional: He is oriented to person, place, and time and well-developed, well-nourished, and in no distress. No distress.  HENT:  Head: Normocephalic and atraumatic.  Eyes: Conjunctivae normal are normal.  Neck: Neck supple. No thyromegaly present.  Cardiovascular: Normal rate, regular rhythm and normal heart sounds.   Pulmonary/Chest: Effort normal and breath sounds normal. No respiratory distress.  Abdominal: He exhibits no distension and no mass. There is no tenderness.  Musculoskeletal: He  exhibits no edema.  Neurological: He is alert and oriented to person, place, and time.  Skin: Skin is warm.  Psychiatric: Memory, affect and judgment normal.    Lab Results  Component Value Date   TSH 1.205 01/04/2012   Lab Results  Component Value Date   WBC 8.3 01/04/2012   HGB 14.6 01/04/2012   HCT 41.8 01/04/2012   MCV 84.4 01/04/2012   PLT 285 01/04/2012   Lab Results  Component Value Date   CREATININE 1.01  01/04/2012   BUN 14 01/04/2012   NA 139 01/04/2012   K 3.9 01/04/2012   CL 102 01/04/2012   CO2 25 01/04/2012   Lab Results  Component Value Date   ALT 19 11/14/2010   AST 24 11/14/2010   ALKPHOS 62 11/14/2010   BILITOT 0.9 11/14/2010   Lab Results  Component Value Date   CHOL 129 11/14/2010   Lab Results  Component Value Date   HDL 43.50 11/14/2010   Lab Results  Component Value Date   LDLCALC 67 11/14/2010   Lab Results  Component Value Date   TRIG 91.0 11/14/2010   Lab Results  Component Value Date   CHOLHDL 3 11/14/2010     Assessment & Plan  Syncope Had a syncopal episode after coitus yesterday. He has felt light headed and ct of sorts somewhat since then. Denies any HA or other neurologic c/o now but did have a slight HA right after the episode. They are headed out of town to clean up his mother in laws house. Labs normal today. He is offered reassurance today and instructed to seek immediate care if he has symptoms that are worrisome to him. Encouraged good hydration and need to arise slowly.   Hematuria Patient reports this is long standing and previously worked up, urine culture negative  BPPV (benign paroxysmal positional vertigo) Has not worsened with this episode.

## 2012-01-07 NOTE — Progress Notes (Signed)
Quick Note:  Patient Informed and voiced understanding ______ 

## 2012-01-09 ENCOUNTER — Ambulatory Visit: Payer: Medicare Other | Admitting: Family Medicine

## 2012-01-11 ENCOUNTER — Ambulatory Visit (INDEPENDENT_AMBULATORY_CARE_PROVIDER_SITE_OTHER): Payer: Medicare Other | Admitting: Family Medicine

## 2012-01-11 ENCOUNTER — Encounter: Payer: Self-pay | Admitting: Family Medicine

## 2012-01-11 VITALS — BP 159/82 | HR 68 | Ht 69.0 in | Wt 176.0 lb

## 2012-01-11 DIAGNOSIS — R55 Syncope and collapse: Secondary | ICD-10-CM

## 2012-01-11 NOTE — Progress Notes (Addendum)
OFFICE NOTE  01/15/2012  CC:  Chief Complaint  Patient presents with  . Follow-up    syncope, feeling better on Clonidine patch     HPI: Patient is a 71 y.o. Caucasian male who is here for 1 wk f/u for post-coital syncopal episode.  He saw Dr. Abner Greenspan and she switched him from clonidine pill to patch and he feels good, no further episodes.  I reviewed the labs from that visit (01/04/12) and CBC CMET, UA, and TSH were all normal.  He brought in home bp results from the last week and systolics range 127-144, diastolics range 70-82, no HR info. He has been very active lately, cleaning out his mother in law's home--she apparently was a pack rat so this is a large undertaking. Denies any recent vertigo, denies HA's.    Pertinent PMH:  Past Medical History  Diagnosis Date  . Prostate cancer 2008    Dr. Laverle Patter  . Diverticulosis   . Hypertension   . Syncope     ? Malignant vasovagal syncope  . Allergic angioedema     ACE inhibitor/beta blocker  . Sinus arrest   . Influenza A 02/2011  . Intermittent vertigo   . 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 Headache   . Gastroenteritis 2012  . Adenomatous colon polyp 06/05/11    Repeat 2018 per Dr. Leone Payor  . Hematuria 01/06/2012    MEDS:  Outpatient Prescriptions Prior to Visit  Medication Sig Dispense Refill  . acetaminophen (TYLENOL) 325 MG tablet Take 650 mg by mouth as needed.      . Alfalfa 500 MG TABS 9-12 TABS/DAY       . aspirin EC 81 MG tablet Take 81 mg by mouth daily.        Marland Kitchen b complex vitamins tablet Take 1 tablet by mouth daily.      . carvedilol (COREG) 25 MG tablet Take 1 tablet (25 mg total) by mouth 2 (two) times daily with a meal.  180 tablet  1  . cholecalciferol (VITAMIN D) 1000 UNITS tablet Take 1,000 Units by mouth as needed.       . cloNIDine (CATAPRES - DOSED IN MG/24 HR) 0.1 mg/24hr patch Place 1 patch (0.1 mg total) onto the skin once a week.  4 patch  2  . NON FORMULARY Take 2 tablets by mouth daily. Prostate Ess plus       . NON FORMULARY Take 3 tablets by mouth daily. osteomatrix       . Omega-3 Fatty Acids (FISH OIL) 1000 MG CAPS Take 1 capsule by mouth 2 (two) times daily.        . pravastatin (PRAVACHOL) 20 MG tablet Take 1 tablet (20 mg total) by mouth daily.  90 tablet  1  . vitamin C (ASCORBIC ACID) 500 MG tablet Take 500 mg by mouth as needed.       . cloNIDine (CATAPRES) 0.1 MG tablet Take 1 tablet (0.1 mg total) by mouth 2 (two) times daily.  180 tablet  0    PE: Blood pressure 159/82, pulse 68, height 5\' 9"  (1.753 m), weight 176 lb (79.833 kg). Gen: Alert, well appearing.  Patient is oriented to person, place, time, and situation. Neck: supple/nontender.  No LAD, mass, or TM.  Carotid pulses 2+ bilaterally, without bruits. CV: RRR, no m/r/g.   LUNGS: CTA bilat, nonlabored resps, good aeration in all lung fields.   IMPRESSION AND PLAN:  Syncope Stable.  No further episodes.  Patient feels well and his bp is not far from being very well controlled, so we'll give his clonidine patch a bit more time to help with bp control. No changes made today in patient's medical regimen.   An After Visit Summary was printed and given to the patient.   FOLLOW UP: 4 mo

## 2012-01-14 ENCOUNTER — Ambulatory Visit (INDEPENDENT_AMBULATORY_CARE_PROVIDER_SITE_OTHER): Payer: Medicare Other

## 2012-01-14 DIAGNOSIS — Z23 Encounter for immunization: Secondary | ICD-10-CM

## 2012-01-15 NOTE — Assessment & Plan Note (Addendum)
Stable.  No further episodes.  Patient feels well and his bp is not far from being very well controlled, so we'll give his clonidine patch a bit more time to help with bp control. No changes made today in patient's medical regimen.

## 2012-02-28 ENCOUNTER — Ambulatory Visit (INDEPENDENT_AMBULATORY_CARE_PROVIDER_SITE_OTHER): Payer: Medicare Other | Admitting: Internal Medicine

## 2012-02-28 ENCOUNTER — Encounter: Payer: Self-pay | Admitting: Internal Medicine

## 2012-02-28 VITALS — BP 140/76 | HR 66 | Resp 18 | Ht 69.0 in | Wt 173.0 lb

## 2012-02-28 DIAGNOSIS — Z950 Presence of cardiac pacemaker: Secondary | ICD-10-CM

## 2012-02-28 DIAGNOSIS — I495 Sick sinus syndrome: Secondary | ICD-10-CM

## 2012-02-28 DIAGNOSIS — R55 Syncope and collapse: Secondary | ICD-10-CM

## 2012-02-28 LAB — PACEMAKER DEVICE OBSERVATION
AL IMPEDENCE PM: 469 Ohm
AL THRESHOLD: 0.9 V
DEVICE MODEL PM: 120835
RV LEAD AMPLITUDE: 18.9 mv
VENTRICULAR PACING PM: 4

## 2012-02-28 NOTE — Progress Notes (Signed)
Patient Care Team: Jeoffrey Massed, MD as PCP - General (Family Medicine) Ivar Bury, MD as Consulting Physician (Urology) Duke Salvia, MD (Cardiology)   HPI  Billy Burns is a 71 y.o. 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  Because of the likelihood that there was a GI trigger given the associated symptoms, he was to be seen by GI. No specific finding was noted. He underwent a colonoscopy that was negative.  He has had one intercurrent spell characterized by dry mouth and pallor but it did not consummate with GI symptoms and syncope. .  Otherwise he denies SOB, chest pain edema or palpitations. There has been no syncope or presyncope.   Past Medical History  Diagnosis Date  . Prostate cancer 2008    Dr. Laverle Patter  . Diverticulosis   . Hypertension   . Syncope     ? Malignant vasovagal syncope  . Allergic angioedema     ACE inhibitor/beta blocker  . Sinus arrest   . Influenza A 02/2011  . Intermittent vertigo   . 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 Headache   . Gastroenteritis 2012  . Adenomatous colon polyp 06/05/11    Repeat 2018 per Dr. Leone Payor  . Hematuria 01/06/2012    Past Surgical History  Procedure Date  . Cataract extraction 1984, 1994    (lens implants bilat)  . Tonsillectomy 1950  . Radioactive seed implant     prostate 07/2009.  PSA dropped to below 2 after this (Dr. Gerome Sam at [facility] in [facility])  . 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 J811 Dual chamber; place in [place], [place] when pt visiting there for holiday 02/2011.  . Transthoracic echocardiogram 02/2011    Normal  . Colonoscopy 06/05/11    6mm sigmoid polyp removed (+adenomatous), moderate diverticulosis, internal hemorrhoids.  Repeat 2018.    Current Outpatient Prescriptions  Medication Sig Dispense Refill  . acetaminophen (TYLENOL) 325 MG tablet Take 650 mg by mouth as needed.       . Alfalfa 500 MG TABS 9-12 TABS/DAY       . aspirin EC 81 MG tablet Take 81 mg by mouth daily.        Marland Kitchen b complex vitamins tablet Take 1 tablet by mouth daily.      . carvedilol (COREG) 25 MG tablet Take 1 tablet (25 mg total) by mouth 2 (two) times daily with a meal.  180 tablet  1  . cholecalciferol (VITAMIN D) 1000 UNITS tablet Take 1,000 Units by mouth as needed.       . cloNIDine (CATAPRES - DOSED IN MG/24 HR) 0.1 mg/24hr patch Place 1 patch (0.1 mg total) onto the skin once a week.  4 patch  2  . Flaxseed, Linseed, (FLAX SEED OIL PO) Take 1,000 mg by mouth 2 (two) times daily.      . NON FORMULARY Take 2 tablets by mouth daily. Prostate Ess plus      . NON FORMULARY Take 3 tablets by mouth daily. osteomatrix       . pravastatin (PRAVACHOL) 20 MG tablet Take 1 tablet (20 mg total) by mouth daily.  90 tablet  1  . vitamin C (ASCORBIC ACID) 500 MG tablet Take 500 mg by mouth as needed.         Allergies  Allergen Reactions  . Ace Inhibitors Other (See Comments)    REACTION: Angioedema  .  Aspirin Other (See Comments)    REACTION: Stomach Discomfort/TAKES 81 MG BUT CANT TAKE 325 MG ASA PER PT.  . Toprol Xl (Metoprolol Succinate) Other (See Comments)    Angioedema    Review of Systems negative except from HPI and PMH  Physical Exam BP 140/76  Pulse 66  Resp 18  Ht 5\' 9"  (1.753 m)  Wt 173 lb (78.472 kg)  BMI 25.55 kg/m2  SpO2 97% Device pocket well healed; without hematoma or erythema Well developed and nourished in no acute distress HENT normal Neck suppleClear Regular rate and rhythm, no murmurs or gallops Abd-soft with active BS without hepatomegaly No Clubbing cyanosis edema Skin-warm and dry A & Oriented  Grossly normal sensory and motor function    Assessment and  Plan

## 2012-02-28 NOTE — Assessment & Plan Note (Signed)
No recurrences.

## 2012-02-28 NOTE — Assessment & Plan Note (Signed)
The patient's device was interrogated.  The information was reviewed. No changes were made in the programming.    

## 2012-03-02 ENCOUNTER — Encounter: Payer: Self-pay | Admitting: Family Medicine

## 2012-03-12 ENCOUNTER — Encounter: Payer: Self-pay | Admitting: Internal Medicine

## 2012-03-24 ENCOUNTER — Other Ambulatory Visit: Payer: Self-pay | Admitting: Family Medicine

## 2012-03-24 DIAGNOSIS — I10 Essential (primary) hypertension: Secondary | ICD-10-CM

## 2012-03-25 MED ORDER — CLONIDINE HCL 0.1 MG/24HR TD PTWK: 1.0000 | MEDICATED_PATCH | TRANSDERMAL | Status: DC

## 2012-03-25 NOTE — Telephone Encounter (Signed)
Refill request for CLONIDINE PATCH 90 DAY TO [facility] Last filled- 01/04/12, #4 X 2 Last seen- 01/11/12 Follow up - 4 MONTHS Refill sent per [facility] refill protocol.

## 2012-05-07 ENCOUNTER — Other Ambulatory Visit (INDEPENDENT_AMBULATORY_CARE_PROVIDER_SITE_OTHER): Payer: Medicare Other

## 2012-05-07 DIAGNOSIS — Z131 Encounter for screening for diabetes mellitus: Secondary | ICD-10-CM

## 2012-05-07 DIAGNOSIS — E785 Hyperlipidemia, unspecified: Secondary | ICD-10-CM

## 2012-05-07 DIAGNOSIS — Z0000 Encounter for general adult medical examination without abnormal findings: Secondary | ICD-10-CM

## 2012-05-07 DIAGNOSIS — R7309 Other abnormal glucose: Secondary | ICD-10-CM

## 2012-05-07 DIAGNOSIS — I10 Essential (primary) hypertension: Secondary | ICD-10-CM

## 2012-05-07 LAB — RENAL FUNCTION PANEL
Albumin: 4.1 g/dL (ref 3.5–5.2)
BUN: 13 mg/dL (ref 6–23)
Calcium: 9.2 mg/dL (ref 8.4–10.5)
Chloride: 101 mEq/L (ref 96–112)
Potassium: 3.9 mEq/L (ref 3.5–5.1)

## 2012-05-07 LAB — HEMOGLOBIN A1C: Hgb A1c MFr Bld: 5.7 % (ref 4.6–6.5)

## 2012-05-07 LAB — HEPATIC FUNCTION PANEL
Alkaline Phosphatase: 92 U/L (ref 39–117)
Bilirubin, Direct: 0 mg/dL (ref 0.0–0.3)
Total Bilirubin: 0.7 mg/dL (ref 0.3–1.2)

## 2012-05-07 LAB — CBC
Hemoglobin: 14.4 g/dL (ref 13.0–17.0)
MCHC: 33.2 g/dL (ref 30.0–36.0)
RDW: 13.6 % (ref 11.5–14.6)

## 2012-05-07 LAB — LIPID PANEL
HDL: 38 mg/dL — ABNORMAL LOW (ref >39.00)
LDL Cholesterol: 73 mg/dL (ref 0–99)
VLDL: 22.2 mg/dL (ref 0.0–40.0)

## 2012-05-07 LAB — TSH: TSH: 0.83 u[IU]/mL (ref 0.35–5.50)

## 2012-05-12 ENCOUNTER — Telehealth: Payer: Self-pay | Admitting: Family Medicine

## 2012-05-13 MED ORDER — PRAVASTATIN SODIUM 20 MG PO TABS: 20.0000 mg | ORAL_TABLET | Freq: Every day | ORAL | Status: DC

## 2012-05-13 MED ORDER — CARVEDILOL 25 MG PO TABS: 25.0000 mg | ORAL_TABLET | Freq: Two times a day (BID) | ORAL | Status: DC

## 2012-05-13 NOTE — Addendum Note (Signed)
Addended by: Luisa Dago on: 05/13/2012 11:33 AM   Modules accepted: Orders

## 2012-05-13 NOTE — Telephone Encounter (Signed)
Refill request for ATORVASTATIN, CARVEDILOL Last filled- 11/09/11-90-DAY X 1 Last seen- 01/11/12 Follow up - 05/13/12 Refill sent per [facility] refill protocol.

## 2012-05-14 ENCOUNTER — Ambulatory Visit (INDEPENDENT_AMBULATORY_CARE_PROVIDER_SITE_OTHER): Payer: Medicare Other | Admitting: Family Medicine

## 2012-05-14 ENCOUNTER — Ambulatory Visit: Payer: Medicare Other | Admitting: Family Medicine

## 2012-05-14 ENCOUNTER — Encounter: Payer: Self-pay | Admitting: Family Medicine

## 2012-05-14 VITALS — BP 140/86 | HR 63 | Ht 69.0 in | Wt 175.0 lb

## 2012-05-14 DIAGNOSIS — Z0000 Encounter for general adult medical examination without abnormal findings: Secondary | ICD-10-CM

## 2012-05-14 NOTE — Progress Notes (Signed)
Office Note 05/14/2012  CC:  Chief Complaint  Patient presents with  . Annual Exam    no problems, discuss labs    HPI:  Billy Burns is a 72 y.o. White male who is here for CPE. We reviewed recent fasting labs in detail today-all normal except HDL a touch low. He exercises regularly--1 mile walks in neighborhood, has been shoveling snow lately! Vertigo not bothering him much since doing home epley's maneuvers.  With head turning to right occas he gets mild sx's for about 5 sec--he is happy with this improvement. PSA with urologist recently was about 2 per pt, also with microhematuria which apparently has been there pretty consistently.  His urol has him returning for recheck of urine in 2 wks or so. Last f/u with Dr. Graciela Husbands was excellent. Had eye exam recently; everything fine/reassuring. He gets dental visits q49mo.    Past Medical History  Diagnosis Date  . Prostate cancer 2008    Dr. Laverle Patter  . Diverticulosis   . Hypertension   . Syncope     ? Malignant vasovagal syncope-Pacer placed  . Allergic angioedema     ACE inhibitor/beta blocker  . Sinus arrest   . Influenza A 02/2011  . Intermittent vertigo   . Headache   . Gastroenteritis 2012  . Adenomatous colon polyp 06/05/11    Repeat 2018 per Dr. Leone Payor  . Hematuria 01/06/2012    Past Surgical History  Procedure Laterality Date  . Cataract extraction  1984, 1994    (lens implants bilat)  . Tonsillectomy  1950  . Radioactive seed implant      prostate 07/2009.  PSA dropped to below 2 after this (Dr. Gerome Sam at [facility] in [facility])  . Pacemaker insertion  02/2011    [employer]. W098 Dual chamber; place in [place], [place] when pt visiting there for holiday 02/2011.  . Transthoracic echocardiogram  02/2011    Normal  . Colonoscopy  06/05/11    6mm sigmoid polyp removed (+adenomatous), moderate diverticulosis, internal hemorrhoids.  Repeat 2018.    Family History  Problem Relation Age of Onset   . Stroke Mother     during endarterectomy  . Colon cancer Father   . Diabetes Neg Hx     History   Social History  . Marital Status: Married    Spouse Name: Kathie Rhodes    Number of Children: 2  . Years of Education: N/A   Occupational History  . Retired     Data processing manager for [employer]   Social History Main Topics  . Smoking status: Former Smoker    Quit date: 03/26/1964  . Smokeless tobacco: Never Used  . Alcohol Use: No  . Drug Use: No  . Sexually Active: Not on file   Other Topics Concern  . Not on file   Social History Narrative   Married, retired from [employer] 2004.   No T/A/Ds.   Walks daily, push mows his yard.          Outpatient Prescriptions Prior to Visit  Medication Sig Dispense Refill  . acetaminophen (TYLENOL) 325 MG tablet Take 650 mg by mouth as needed.      . Alfalfa 500 MG TABS 9-12 TABS/DAY       . aspirin EC 81 MG tablet Take 81 mg by mouth daily.        Marland Kitchen b complex vitamins tablet Take 1 tablet by mouth daily.      . carvedilol (COREG) 25 MG  tablet Take 1 tablet (25 mg total) by mouth 2 (two) times daily with a meal.  180 tablet  1  . cholecalciferol (VITAMIN D) 1000 UNITS tablet Take 1,000 Units by mouth as needed.       . cloNIDine (CATAPRES - DOSED IN MG/24 HR) 0.1 mg/24hr patch Place 1 patch (0.1 mg total) onto the skin once a week.  12 patch  1  . Flaxseed, Linseed, (FLAX SEED OIL PO) Take 1,000 mg by mouth 2 (two) times daily.      . NON FORMULARY Take 2 tablets by mouth daily. Prostate Ess plus      . NON FORMULARY Take 4 tablets by mouth daily. osteomatrix       . pravastatin (PRAVACHOL) 20 MG tablet Take 1 tablet (20 mg total) by mouth daily.  90 tablet  1  . vitamin C (ASCORBIC ACID) 500 MG tablet Take 500 mg by mouth as needed.        No facility-administered medications prior to visit.    Allergies  Allergen Reactions  . Ace Inhibitors Other (See Comments)    REACTION: Angioedema  . Aspirin Other (See Comments)     REACTION: Stomach Discomfort/TAKES 81 MG BUT CANT TAKE 325 MG ASA PER PT.  . Toprol Xl (Metoprolol Succinate) Other (See Comments)    Angioedema    ROS Review of Systems  Constitutional: Negative for fever, chills, appetite change and fatigue.  HENT: Negative for ear pain, congestion, sore throat, neck stiffness and dental problem.   Eyes: Negative for discharge, redness and visual disturbance.  Respiratory: Negative for cough, chest tightness, shortness of breath and wheezing.   Cardiovascular: Negative for chest pain, palpitations and leg swelling.  Gastrointestinal: Negative for nausea, vomiting, abdominal pain, diarrhea and blood in stool.  Genitourinary: Negative for dysuria, urgency, frequency, hematuria, flank pain and difficulty urinating.  Musculoskeletal: Negative for myalgias, back pain, joint swelling and arthralgias.  Skin: Negative for pallor and rash.  Neurological: Negative for dizziness, speech difficulty, weakness and headaches.  Hematological: Negative for adenopathy. Does not bruise/bleed easily.  Psychiatric/Behavioral: Negative for confusion and sleep disturbance. The patient is not nervous/anxious.     PE; Blood pressure 140/86, pulse 63, height 5\' 9"  (1.753 m), weight 175 lb (79.379 kg). Gen: Alert, well appearing.  Patient is oriented to person, place, time, and situation. AFFECT: pleasant, lucid thought and speech. ENT: Ears: EACs clear, normal epithelium.  TMs with good light reflex and landmarks bilaterally.  Eyes: no injection, icteris, swelling, or exudate.  EOMI, PERRLA. Nose: no drainage or turbinate edema/swelling.  No injection or focal lesion.  Mouth: lips without lesion/swelling.  Oral mucosa pink and moist.  Dentition intact and without obvious caries or gingival swelling.  Oropharynx without erythema, exudate, or swelling.  Neck: supple/nontender.  No LAD, mass, or TM.  Carotid pulses 2+ bilaterally, without bruits. CV: RRR, no m/r/g.   LUNGS: CTA  bilat, nonlabored resps, good aeration in all lung fields. ABD: soft, NT, ND, BS normal.  No hepatospenomegaly or mass.  No bruits. EXT: no clubbing, cyanosis, or edema.  Musculoskeletal: no joint swelling, erythema, warmth, or tenderness.  ROM of all joints intact. Skin - no sores or suspicious lesions or rashes or color change GU/rectal: deferred--he gets this followed regularly through his urologist   Pertinent labs:  Lab Results  Component Value Date   TSH 0.83 05/07/2012   Lab Results  Component Value Date   WBC 6.3 05/07/2012   HGB 14.4 05/07/2012  HCT 43.5 05/07/2012   MCV 88.2 05/07/2012   PLT 317.0 05/07/2012   Lab Results  Component Value Date   CREATININE 1.0 05/07/2012   BUN 13 05/07/2012   NA 136 05/07/2012   K 3.9 05/07/2012   CL 101 05/07/2012   CO2 27 05/07/2012   Lab Results  Component Value Date   ALT 25 05/07/2012   AST 29 05/07/2012   ALKPHOS 92 05/07/2012   BILITOT 0.7 05/07/2012   Lab Results  Component Value Date   CHOL 133 05/07/2012   Lab Results  Component Value Date   HDL 38.00* 05/07/2012   Lab Results  Component Value Date   LDLCALC 73 05/07/2012   Lab Results  Component Value Date   TRIG 111.0 05/07/2012   Lab Results  Component Value Date   CHOLHDL 4 05/07/2012   Lab Results  Component Value Date   PSA 8.40* 11/17/2008       ASSESSMENT AND PLAN:   Health maintenance examination Reviewed age and gender appropriate health maintenance issues (prudent diet, regular exercise, health risks of tobacco and excessive alcohol, use of seatbelts, fire alarms in home, use of sunscreen).  Also reviewed age and gender appropriate health screening as well as vaccine recommendations. Labs normal. Next colonoscopy due 2018.   An After Visit Summary was printed and given to the patient.   FOLLOW UP:  Return in about 6 months (around 11/11/2012) for f/u HTN.

## 2012-05-14 NOTE — Assessment & Plan Note (Signed)
Reviewed age and gender appropriate health maintenance issues (prudent diet, regular exercise, health risks of tobacco and excessive alcohol, use of seatbelts, fire alarms in home, use of sunscreen).  Also reviewed age and gender appropriate health screening as well as vaccine recommendations. Labs normal. Next colonoscopy due 2018.

## 2012-05-14 NOTE — Patient Instructions (Signed)
Health Maintenance, Males A healthy lifestyle and preventative care can promote health and wellness.  Maintain regular health, dental, and eye exams.  Eat a healthy diet. Foods like vegetables, fruits, whole grains, low-fat dairy products, and lean protein foods contain the nutrients you need without too many calories. Decrease your intake of foods high in solid fats, added sugars, and salt. Get information about a proper diet from your caregiver, if necessary.  Regular physical exercise is one of the most important things you can do for your health. Most adults should get at least 150 minutes of moderate-intensity exercise (any activity that increases your heart rate and causes you to sweat) each week. In addition, most adults need muscle-strengthening exercises on 2 or more days a week.   Maintain a healthy weight. The body mass index (BMI) is a screening tool to identify possible weight problems. It provides an estimate of body fat based on height and weight. Your caregiver can help determine your BMI, and can help you achieve or maintain a healthy weight. For adults 20 years and older:  A BMI below 18.5 is considered underweight.  A BMI of 18.5 to 24.9 is normal.  A BMI of 25 to 29.9 is considered overweight.  A BMI of 30 and above is considered obese.  Maintain normal blood lipids and cholesterol by exercising and minimizing your intake of saturated fat. Eat a balanced diet with plenty of fruits and vegetables. Blood tests for lipids and cholesterol should begin at age 20 and be repeated every 5 years. If your lipid or cholesterol levels are high, you are over 50, or you are a high risk for heart disease, you may need your cholesterol levels checked more frequently.Ongoing high lipid and cholesterol levels should be treated with medicines, if diet and exercise are not effective.  If you smoke, find out from your caregiver how to quit. If you do not use tobacco, do not start.  If you  choose to drink alcohol, do not exceed 2 drinks per day. One drink is considered to be 12 ounces (355 mL) of beer, 5 ounces (148 mL) of wine, or 1.5 ounces (44 mL) of liquor.  Avoid use of street drugs. Do not share needles with anyone. Ask for help if you need support or instructions about stopping the use of drugs.  High blood pressure causes heart disease and increases the risk of stroke. Blood pressure should be checked at least every 1 to 2 years. Ongoing high blood pressure should be treated with medicines if weight loss and exercise are not effective.  If you are 45 to 73 years old, ask your caregiver if you should take aspirin to prevent heart disease.  Diabetes screening involves taking a blood sample to check your fasting blood sugar level. This should be done once every 3 years, after age 45, if you are within normal weight and without risk factors for diabetes. Testing should be considered at a younger age or be carried out more frequently if you are overweight and have at least 1 risk factor for diabetes.  Colorectal cancer can be detected and often prevented. Most routine colorectal cancer screening begins at the age of 50 and continues through age 75. However, your caregiver may recommend screening at an earlier age if you have risk factors for colon cancer. On a yearly basis, your caregiver may provide home test kits to check for hidden blood in the stool. Use of a small camera at the end of a tube,   to directly examine the colon (sigmoidoscopy or colonoscopy), can detect the earliest forms of colorectal cancer. Talk to your caregiver about this at age 50, when routine screening begins. Direct examination of the colon should be repeated every 5 to 10 years through age 75, unless early forms of pre-cancerous polyps or small growths are found.  Hepatitis C blood testing is recommended for all people born from 1945 through 1965 and any individual with known risks for hepatitis C.  Healthy  men should no longer receive prostate-specific antigen (PSA) blood tests as part of routine cancer screening. Consult with your caregiver about prostate cancer screening.  Testicular cancer screening is not recommended for adolescents or adult males who have no symptoms. Screening includes self-exam, caregiver exam, and other screening tests. Consult with your caregiver about any symptoms you have or any concerns you have about testicular cancer.  Practice safe sex. Use condoms and avoid high-risk sexual practices to reduce the spread of sexually transmitted infections (STIs).  Use sunscreen with a sun protection factor (SPF) of 30 or greater. Apply sunscreen liberally and repeatedly throughout the day. You should seek shade when your shadow is shorter than you. Protect yourself by wearing long sleeves, pants, a wide-brimmed hat, and sunglasses year round, whenever you are outdoors.  Notify your caregiver of new moles or changes in moles, especially if there is a change in shape or color. Also notify your caregiver if a mole is larger than the size of a pencil eraser.  A one-time screening for abdominal aortic aneurysm (AAA) and surgical repair of large AAAs by sound wave imaging (ultrasonography) is recommended for ages 65 to 75 years who are current or former smokers.  Stay current with your immunizations. Document Released: 09/08/2007 Document Revised: 06/04/2011 Document Reviewed: 08/07/2010 ExitCare Patient Information 2013 [employer].  

## 2012-05-15 ENCOUNTER — Telehealth: Payer: Self-pay | Admitting: Family Medicine

## 2012-05-15 MED ORDER — PRAVASTATIN SODIUM 20 MG PO TABS: 20.0000 mg | ORAL_TABLET | Freq: Every day | ORAL | Status: DC

## 2012-05-15 MED ORDER — CARVEDILOL 25 MG PO TABS: 25.0000 mg | ORAL_TABLET | Freq: Two times a day (BID) | ORAL | Status: DC

## 2012-05-15 NOTE — Telephone Encounter (Signed)
30 day supply of CARVEDILOL and PRAVASTATIN sent to pharmacy.

## 2012-06-06 ENCOUNTER — Other Ambulatory Visit: Payer: Self-pay | Admitting: *Deleted

## 2012-06-06 DIAGNOSIS — I10 Essential (primary) hypertension: Secondary | ICD-10-CM

## 2012-06-06 MED ORDER — CLONIDINE HCL 0.1 MG/24HR TD PTWK: 1.0000 | MEDICATED_PATCH | TRANSDERMAL | Status: DC

## 2012-06-06 NOTE — Telephone Encounter (Signed)
Refill request for CLONIDINE PATCH Last filled - 03/25/12, #12 X 1 Last seen- 05/14/12 Follow up - 6 MONTHS Refill sent per [facility] refill protocol.

## 2012-06-24 HISTORY — PX: CYSTOSTOMY W/ BLADDER BIOPSY: SHX1431

## 2012-08-27 ENCOUNTER — Ambulatory Visit (INDEPENDENT_AMBULATORY_CARE_PROVIDER_SITE_OTHER): Payer: Medicare Other | Admitting: *Deleted

## 2012-08-27 DIAGNOSIS — I495 Sick sinus syndrome: Secondary | ICD-10-CM

## 2012-08-27 LAB — PACEMAKER DEVICE OBSERVATION
AL IMPEDENCE PM: 464 Ohm
AL THRESHOLD: 0.7 V
DEVICE MODEL PM: 120835
RV LEAD AMPLITUDE: 21.1 mv
VENTRICULAR PACING PM: 6

## 2012-08-27 NOTE — Progress Notes (Signed)
PPM check in office. 

## 2012-09-01 ENCOUNTER — Telehealth: Payer: Self-pay | Admitting: Family Medicine

## 2012-09-01 NOTE — Telephone Encounter (Signed)
PER NOTE in Comment Box of Visit Info, Created By: 09/01/2012 3:55 PM Diane S Tomerlin Clonidine .1mg  patch [facility] 90 day supply  Refill request for CLONIDINE PATCH 90 DAY TO [facility]  Last filled- 03.14.14, #12x1 to [facility]  Last seen- 02.19.14  Follow up - 6 MONTHS  Spoke w/patient about remaining refill on [facility] prescription; he will fill this & contact us when new Rx is needed [90-day]/SLS

## 2012-09-01 NOTE — Telephone Encounter (Signed)
Patient is using his last patch this Thursday 09/04/12

## 2012-09-17 ENCOUNTER — Encounter: Payer: Self-pay | Admitting: Internal Medicine

## 2012-11-07 ENCOUNTER — Telehealth: Payer: Self-pay | Admitting: Family Medicine

## 2012-11-07 MED ORDER — CARVEDILOL 25 MG PO TABS: 25.0000 mg | ORAL_TABLET | Freq: Two times a day (BID) | ORAL | Status: DC

## 2012-11-07 NOTE — Telephone Encounter (Signed)
Rx sent to pharmacy   

## 2012-11-11 ENCOUNTER — Encounter: Payer: Self-pay | Admitting: Family Medicine

## 2012-11-11 ENCOUNTER — Ambulatory Visit (INDEPENDENT_AMBULATORY_CARE_PROVIDER_SITE_OTHER): Payer: Medicare Other | Admitting: Family Medicine

## 2012-11-11 VITALS — BP 140/70 | HR 64 | Temp 98.2°F | Ht 69.0 in | Wt 174.8 lb

## 2012-11-11 DIAGNOSIS — E785 Hyperlipidemia, unspecified: Secondary | ICD-10-CM

## 2012-11-11 DIAGNOSIS — I10 Essential (primary) hypertension: Secondary | ICD-10-CM

## 2012-11-11 MED ORDER — CLONIDINE HCL 0.1 MG/24HR TD PTWK: 1.0000 | MEDICATED_PATCH | TRANSDERMAL | Status: DC

## 2012-11-11 MED ORDER — PRAVASTATIN SODIUM 20 MG PO TABS: 20.0000 mg | ORAL_TABLET | Freq: Every day | ORAL | Status: DC

## 2012-11-11 MED ORDER — CARVEDILOL 25 MG PO TABS: 25.0000 mg | ORAL_TABLET | Freq: Two times a day (BID) | ORAL | Status: DC

## 2012-11-11 NOTE — Progress Notes (Signed)
OFFICE NOTE  11/11/2012  CC:  Chief Complaint  Patient presents with  . Follow-up    6 month     HPI: Patient is a 72 y.o. Caucasian male who is here for 6 mo f/u HTN. Doing well.  Push mowing yard. BP checks 117/62 most recent. Compliant with meds.  Still with occ BPPV sx's--very short lived and mild sx's.  [place] bladder bx [place] for some hematuria[place] records yet available--was normal per pt.  Also, recent f/u with Dr. Graciela Husbands showed good pacer/ICD report.    Pertinent PMH:  Past Medical History  Diagnosis Date  . Prostate cancer 2008    Dr. Laverle Patter  . Diverticulosis   . Hypertension   . Syncope     ? Malignant vasovagal syncope-Pacer placed  . Allergic angioedema     ACE inhibitor/beta blocker  . Sinus arrest   . Influenza A 02/2011  . Intermittent vertigo   . Headache(784.0)   . Gastroenteritis 2012  . Adenomatous colon polyp 06/05/11    Repeat 2018 per Dr. Leone Payor  . Hematuria 01/06/2012    MEDS:  Outpatient Prescriptions Prior to Visit  Medication Sig Dispense Refill  . acetaminophen (TYLENOL) 325 MG tablet Take 650 mg by mouth as needed.      . Alfalfa 500 MG TABS 9-12 TABS/DAY       . aspirin EC 81 MG tablet Take 81 mg by mouth daily.        Marland Kitchen b complex vitamins tablet Take 1 tablet by mouth daily.      . carvedilol (COREG) 25 MG tablet Take 1 tablet (25 mg total) by mouth 2 (two) times daily with a meal.  60 tablet  1  . cholecalciferol (VITAMIN D) 1000 UNITS tablet Take 1,000 Units by mouth as needed.       . cloNIDine (CATAPRES - DOSED IN MG/24 HR) 0.1 mg/24hr patch Place 1 patch (0.1 mg total) onto the skin once a week.  12 patch  1  . Flaxseed, Linseed, (FLAX SEED OIL PO) Take 1,000 mg by mouth 2 (two) times daily.      . NON FORMULARY Take 2 tablets by mouth daily. Prostate Ess plus      . NON FORMULARY Take 4 tablets by mouth daily. osteomatrix       . pravastatin (PRAVACHOL) 20 MG tablet Take 1 tablet (20 mg total) by mouth  daily.  30 tablet  1  . vitamin C (ASCORBIC ACID) 500 MG tablet Take 500 mg by mouth as needed.        No facility-administered medications prior to visit.    PE: Blood pressure 140/70, pulse 64, temperature 98.2 F (36.8 C), temperature source Oral, height 5\' 9"  (1.753 m), weight 174 lb 12 oz (79.266 kg), SpO2 97.00%. Gen: Alert, well appearing.  Patient is oriented to person, place, time, and situation. AFFECT: pleasant, lucid thought and speech. CV: RRR, no m/r/g.   LUNGS: CTA bilat, nonlabored resps, good aeration in all lung fields. EXT: no clubbing, cyanosis, or edema.   LAB none today  IMPRESSION AND PLAN:  1) HTN; stable.  Cont. Current med. 2) Hyperlipidemia: stable.  Continue current med. 3) BPPV-largely resolved.  We'll repeat routine health panel labs in 6 mo at next CPE f/u.  FOLLOW UP: 6 mo

## 2012-11-13 ENCOUNTER — Telehealth: Payer: Self-pay | Admitting: Family Medicine

## 2012-11-13 DIAGNOSIS — I10 Essential (primary) hypertension: Secondary | ICD-10-CM

## 2012-11-13 MED ORDER — CLONIDINE HCL 0.1 MG/24HR TD PTWK: 1.0000 | MEDICATED_PATCH | TRANSDERMAL | Status: DC

## 2012-11-13 MED ORDER — PRAVASTATIN SODIUM 20 MG PO TABS: 20.0000 mg | ORAL_TABLET | Freq: Every day | ORAL | Status: DC

## 2012-11-13 NOTE — Telephone Encounter (Signed)
Patient contacted [facility]. They still haven't received patient's Rx. Patient runs out of medications tomorrow. Please call patient.

## 2012-11-13 NOTE — Telephone Encounter (Signed)
Resent rx to [facility].

## 2012-11-25 ENCOUNTER — Telehealth: Payer: Self-pay | Admitting: Emergency Medicine

## 2012-11-25 DIAGNOSIS — I10 Essential (primary) hypertension: Secondary | ICD-10-CM

## 2012-11-25 NOTE — Telephone Encounter (Signed)
Patient would like to be called,he has a question about his refill and the mail order refills he is confused.

## 2012-11-26 MED ORDER — CARVEDILOL 25 MG PO TABS: 25.0000 mg | ORAL_TABLET | Freq: Two times a day (BID) | ORAL | Status: DC

## 2012-11-26 MED ORDER — PRAVASTATIN SODIUM 20 MG PO TABS: 20.0000 mg | ORAL_TABLET | Freq: Every day | ORAL | Status: DC

## 2012-11-26 MED ORDER — CLONIDINE HCL 0.1 MG/24HR TD PTWK: 1.0000 | MEDICATED_PATCH | TRANSDERMAL | Status: DC

## 2012-11-26 NOTE — Telephone Encounter (Signed)
Patient requesting medication to be sent to [facility] for 90 day supply

## 2013-01-08 ENCOUNTER — Ambulatory Visit (INDEPENDENT_AMBULATORY_CARE_PROVIDER_SITE_OTHER): Payer: Medicare Other

## 2013-01-08 DIAGNOSIS — Z23 Encounter for immunization: Secondary | ICD-10-CM

## 2013-02-27 ENCOUNTER — Encounter: Payer: Medicare Other | Admitting: Internal Medicine

## 2013-03-04 ENCOUNTER — Ambulatory Visit (INDEPENDENT_AMBULATORY_CARE_PROVIDER_SITE_OTHER): Payer: Medicare Other | Admitting: Internal Medicine

## 2013-03-04 ENCOUNTER — Encounter (INDEPENDENT_AMBULATORY_CARE_PROVIDER_SITE_OTHER): Payer: Self-pay

## 2013-03-04 ENCOUNTER — Encounter: Payer: Self-pay | Admitting: Internal Medicine

## 2013-03-04 VITALS — BP 185/80 | HR 60 | Ht 69.0 in | Wt 180.0 lb

## 2013-03-04 DIAGNOSIS — I495 Sick sinus syndrome: Secondary | ICD-10-CM

## 2013-03-04 DIAGNOSIS — Z950 Presence of cardiac pacemaker: Secondary | ICD-10-CM

## 2013-03-04 DIAGNOSIS — R55 Syncope and collapse: Secondary | ICD-10-CM

## 2013-03-04 DIAGNOSIS — I10 Essential (primary) hypertension: Secondary | ICD-10-CM

## 2013-03-04 DIAGNOSIS — F17200 Nicotine dependence, unspecified, uncomplicated: Secondary | ICD-10-CM

## 2013-03-04 DIAGNOSIS — IMO0001 Reserved for inherently not codable concepts without codable children: Secondary | ICD-10-CM | POA: Insufficient documentation

## 2013-03-04 LAB — MDC_IDC_ENUM_SESS_TYPE_INCLINIC
Brady Statistic RA Percent Paced: 28 %
Brady Statistic RV Percent Paced: 9 %
Implantable Pulse Generator Serial Number: 120835
Lead Channel Pacing Threshold Amplitude: 0.9 V
Lead Channel Sensing Intrinsic Amplitude: 22.8 mV
Lead Channel Sensing Intrinsic Amplitude: 8.2 mV

## 2013-03-04 MED ORDER — MAGNESIUM OXIDE 400 MG PO CAPS: 400.0000 mg | ORAL_CAPSULE | Freq: Every day | ORAL | Status: DC

## 2013-03-04 NOTE — Assessment & Plan Note (Addendum)
Elevated today but says 130 at home so will not make cahnges; however, he will check it between now and when he sees his PCP next month. I suspect that we will find that there are numbers in the 150 range and I suggested that perhaps increasing his clonidine TTS 2 system may be of value

## 2013-03-04 NOTE — Assessment & Plan Note (Signed)
Counseled re stopping  1800 ASTOPNOW, Lozenges and patches and E cigs

## 2013-03-04 NOTE — Assessment & Plan Note (Signed)
27% atrial pacing

## 2013-03-04 NOTE — Assessment & Plan Note (Signed)
The patient's device was interrogated.  The information was reviewed. No changes were made in the programming.    

## 2013-03-04 NOTE — Assessment & Plan Note (Signed)
No recurrent syncope 

## 2013-03-04 NOTE — Progress Notes (Signed)
Patient Care Team: Jeoffrey Massed, MD as PCP - General (Family Medicine) Ivar Bury, MD as Consulting Physician (Urology) Duke Salvia, MD (Cardiology) and   HPI  Billy Burns is a 72 y.o. 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  Because of the likelihood that there was a GI trigger given the associated symptoms, he was to be seen by GI. No specific finding was noted. He underwent a colonoscopy that was negative.  He has had one intercurrent spell characterized by dry mouth and pallor but it did not consummate with GI symptoms and syncope.  .  Otherwise he denies  chest pain edema or palpitations. There has been no syncope or presyncope.  He says his blood pressures at home are most in the 130 range although 150 is not unheard of.  He is working doing [employer] examiner. He says he keeps him from getting bored.   Past Medical History  Diagnosis Date  . Prostate cancer 2008    Dr. Laverle Patter  . Diverticulosis   . Hypertension   . Syncope     ? Malignant vasovagal syncope-Pacer placed  . Allergic angioedema     ACE inhibitor/beta blocker  . Sinus arrest   . Influenza A 02/2011  . Intermittent vertigo   . Headache(784.0)   . Gastroenteritis 2012  . Adenomatous colon polyp 06/05/11    Repeat 2018 per Dr. Leone Payor  . Hematuria 01/06/2012    Past Surgical History  Procedure Laterality Date  . Cataract extraction  1984, 1994    (lens implants bilat)  . Tonsillectomy  1950  . Radioactive seed implant      prostate 07/2009.  PSA dropped to below 2 after this (Dr. Gerome Sam at [facility] in [facility])  . Pacemaker insertion  02/2011    [employer]. Z610 Dual chamber; place in [place], [place] when pt visiting there for holiday 02/2011.  . Transthoracic echocardiogram  02/2011    Normal  . Colonoscopy  06/05/11    6mm sigmoid polyp removed (+adenomatous), moderate  diverticulosis, internal hemorrhoids.  Repeat 2018.    Current Outpatient Prescriptions  Medication Sig Dispense Refill  . acetaminophen (TYLENOL) 325 MG tablet Take 650 mg by mouth as needed.      . Alfalfa 500 MG TABS 9-12 TABS/DAY       . aspirin EC 81 MG tablet Take 81 mg by mouth daily.        Marland Kitchen b complex vitamins tablet Take 1 tablet by mouth daily.      . carvedilol (COREG) 25 MG tablet Take 1 tablet (25 mg total) by mouth 2 (two) times daily with a meal.  180 tablet  1  . cholecalciferol (VITAMIN D) 1000 UNITS tablet Take 1,000 Units by mouth as needed.       . cloNIDine (CATAPRES - DOSED IN MG/24 HR) 0.1 mg/24hr patch Place 1 patch (0.1 mg total) onto the skin once a week.  12 patch  1  . Flaxseed, Linseed, (FLAX SEED OIL PO) Take 1,000 mg by mouth 2 (two) times daily.      . Magnesium Oxide 400 MG CAPS Take 1 capsule (400 mg total) by mouth daily.  30 capsule  6  . NON FORMULARY Take 2 tablets by mouth daily. Prostate Ess plus      . NON FORMULARY Take 4 tablets by mouth daily. osteomatrix       .  pravastatin (PRAVACHOL) 20 MG tablet Take 1 tablet (20 mg total) by mouth daily.  90 tablet  1  . vitamin C (ASCORBIC ACID) 500 MG tablet Take 500 mg by mouth as needed.        No current facility-administered medications for this visit.    Allergies  Allergen Reactions  . Ace Inhibitors Other (See Comments)    REACTION: Angioedema  . Aspirin Other (See Comments)    REACTION: Stomach Discomfort/TAKES 81 MG BUT CANT TAKE 325 MG ASA PER PT.  . Toprol Xl [Metoprolol Succinate] Other (See Comments)    Angioedema    Review of Systems negative except from HPI and PMH  Physical Exam BP 185/80  Pulse 60  Ht 5\' 9"  (1.753 m)  Wt 180 lb (81.647 kg)  BMI 26.57 kg/m2 Well developed and well nourished in no acute distress HENT normal E scleral and icterus clear Neck Supple JVP flat; carotids brisk and full Clear Device pocket well healed; without hematoma or erythema.  There is no  tethering   Regular rate and rhythm, no murmurs gallops or rub Soft with active bowel sounds No clubbing cyanosis none Edema Alert and oriented, grossly normal motor and sensory function Skin Warm and Dry  ECG  Atrial pacing with intrinsic conduction Assessment and  Plan

## 2013-03-04 NOTE — Patient Instructions (Addendum)
Your physician recommends that you continue on your current medications as directed. Please refer to the Current Medication list given to you today.  Your physician wants you to follow-up in: 6 months with device clinic. You will receive a reminder letter in the mail two months in advance. If you don't receive a letter, please call our office to schedule the follow-up appointment.  Your physician wants you to follow-up in: one year with Brooke Edmisten, PAC.  You will receive a reminder letter in the mail two months in advance. If you don't receive a letter, please call our office to schedule the follow-up appointment.   

## 2013-03-09 ENCOUNTER — Telehealth: Payer: Self-pay | Admitting: Family Medicine

## 2013-03-09 ENCOUNTER — Ambulatory Visit (HOSPITAL_BASED_OUTPATIENT_CLINIC_OR_DEPARTMENT_OTHER)
Admission: RE | Admit: 2013-03-09 | Discharge: 2013-03-09 | Disposition: A | Discharge status: Home / Self Care | Payer: Medicare Other | Source: Ambulatory Visit | Attending: Family Medicine | Admitting: Family Medicine

## 2013-03-09 DIAGNOSIS — M545 Low back pain, unspecified: Secondary | ICD-10-CM | POA: Insufficient documentation

## 2013-03-09 DIAGNOSIS — M47817 Spondylosis without myelopathy or radiculopathy, lumbosacral region: Secondary | ICD-10-CM | POA: Insufficient documentation

## 2013-03-09 NOTE — Telephone Encounter (Signed)
OK.  X-ray order is in EP[facility].

## 2013-03-09 NOTE — Telephone Encounter (Signed)
Patient has been advised

## 2013-03-09 NOTE — Telephone Encounter (Signed)
Patient picked up something heavy at work. He is having low back pain & has been to Dr. Sherrie Sport at [facility]. They recommend he get xrays & bring the disc back to them. Please enter order into system. Patient would like to go to [facility]. Please call patient as soon as order is in [facility].

## 2013-03-12 ENCOUNTER — Ambulatory Visit (INDEPENDENT_AMBULATORY_CARE_PROVIDER_SITE_OTHER): Payer: Medicare Other | Admitting: Family Medicine

## 2013-03-12 ENCOUNTER — Encounter: Payer: Self-pay | Admitting: Family Medicine

## 2013-03-12 VITALS — BP 199/74 | HR 67 | Temp 98.6°F | Resp 16 | Ht 69.0 in | Wt 179.0 lb

## 2013-03-12 DIAGNOSIS — M545 Low back pain, unspecified: Secondary | ICD-10-CM

## 2013-03-12 DIAGNOSIS — I10 Essential (primary) hypertension: Secondary | ICD-10-CM

## 2013-03-12 MED ORDER — CLONIDINE HCL 0.2 MG/24HR TD PTWK: 0.2000 mg | MEDICATED_PATCH | TRANSDERMAL | Status: DC

## 2013-03-12 NOTE — Assessment & Plan Note (Signed)
Inaedequate control. Increase catapres to the TTS 2 patch. Continue coreg 25mg  bid. Monitor bp and HR daily-bid and call these to the nurse or drop them off in 1 wk. Signs/symptoms to call or return for were reviewed and pt expressed understanding.

## 2013-03-12 NOTE — Assessment & Plan Note (Signed)
Likely contributing some to his elevated bp, but he has had periods without pain in the last week but bp still up during pain-free times. Encouraged pt to maximize tylenol use a bit more: 500mg  q6h.

## 2013-03-12 NOTE — Progress Notes (Signed)
Pre visit review using our clinic review tool, if applicable. No additional management support is needed unless otherwise documented below in the visit note.  OFFICE NOTE  03/12/2013  CC:  Chief Complaint  Patient presents with  . Hypertension     HPI: Patient is a 72 y.o. Caucasian male who is here for f/u HTN, discuss bp meds. BPs at home 150s-170s systolic, 70s diastolic, HR 60s.  BP up at recent f/u with Dr. Graciela Husbands.  That visit was all good otherwise. No HA, dizziness, CP, or fatigue.  Has recently strained his low back and has been going to the chiropracter for this, says he is improving some but still in pain intermittently esp with "moving the wrong way".  Tylenol 500mg  helps some but he says 1000 mg dose makes him a little "loopy". He recently got L/S plain films which showed mild deg changes diffusely. He says the chiropracter treatments are improving things some.  Pertinent PMH:  Past Medical History  Diagnosis Date  . Prostate cancer 2008    Dr. Laverle Patter  . Diverticulosis   . Hypertension   . Syncope     ? Malignant vasovagal syncope-Pacer placed  . Allergic angioedema     ACE inhibitor/beta blocker  . Sinus arrest   . Influenza A 02/2011  . Intermittent vertigo   . Headache(784.0)   . Gastroenteritis 2012  . Adenomatous colon polyp 06/05/11    Repeat 2018 per Dr. Leone Payor  . Hematuria 01/06/2012   Past surgical, social, and family history reviewed and no changes noted since last office visit. MEDS:  Outpatient Prescriptions Prior to Visit  Medication Sig Dispense Refill  . Alfalfa 500 MG TABS 9-12 TABS/DAY       . aspirin EC 81 MG tablet Take 81 mg by mouth daily.        Marland Kitchen b complex vitamins tablet Take 1 tablet by mouth daily.      . carvedilol (COREG) 25 MG tablet Take 1 tablet (25 mg total) by mouth 2 (two) times daily with a meal.  180 tablet  1  . cholecalciferol (VITAMIN D) 1000 UNITS tablet Take 1,000 Units by mouth as needed.       . cloNIDine  (CATAPRES - DOSED IN MG/24 HR) 0.1 mg/24hr patch Place 1 patch (0.1 mg total) onto the skin once a week.  12 patch  1  . Flaxseed, Linseed, (FLAX SEED OIL PO) Take 1,000 mg by mouth 2 (two) times daily.      . NON FORMULARY Take 2 tablets by mouth daily. Prostate Ess plus      . NON FORMULARY Take 4 tablets by mouth daily. osteomatrix       . pravastatin (PRAVACHOL) 20 MG tablet Take 1 tablet (20 mg total) by mouth daily.  90 tablet  1  . vitamin C (ASCORBIC ACID) 500 MG tablet Take 500 mg by mouth as needed.       Marland Kitchen acetaminophen (TYLENOL) 325 MG tablet Take 650 mg by mouth as needed.      . Magnesium Oxide 400 MG CAPS Take 1 capsule (400 mg total) by mouth daily.  30 capsule  6   No facility-administered medications prior to visit.    PE: Blood pressure 199/74, pulse 67, temperature 98.6 F (37 C), temperature source Temporal, resp. rate 16, height 5\' 9"  (1.753 m), weight 179 lb (81.194 kg), SpO2 98.00%. Gen: Alert, well appearing.  Patient is oriented to person, place, time, and situation. CV: RRR, no  m/r/g.   LUNGS: CTA bilat, nonlabored resps, good aeration in all lung fields. EXT: no clubbing, cyanosis, or edema.   IMPRESSION AND PLAN:  HYPERTENSION Inaedequate control. Increase catapres to the TTS 2 patch. Continue coreg 25mg  bid. Monitor bp and HR daily-bid and call these to the nurse or drop them off in 1 wk. Signs/symptoms to call or return for were reviewed and pt expressed understanding.   Acute low back pain Likely contributing some to his elevated bp, but he has had periods without pain in the last week but bp still up during pain-free times. Encouraged pt to maximize tylenol use a bit more: 500mg  q6h.   An After Visit Summary was printed and given to the patient.  FOLLOW UP: keep appt schedule for next month, come back earlier if needed.

## 2013-03-23 ENCOUNTER — Encounter: Payer: Self-pay | Admitting: Internal Medicine

## 2013-03-27 ENCOUNTER — Telehealth: Payer: Self-pay | Admitting: Family Medicine

## 2013-03-27 NOTE — Telephone Encounter (Signed)
Pls notify pt that I reviewed his bp's and heart rates that he dropped off and they look better. Continue current meds at current doses.--thx

## 2013-03-30 NOTE — Telephone Encounter (Signed)
Patient is aware and will continue current medications at current doses.

## 2013-04-24 ENCOUNTER — Telehealth: Payer: Self-pay | Admitting: Family Medicine

## 2013-04-24 MED ORDER — CLONIDINE HCL 0.2 MG/24HR TD PTWK: 0.2000 mg | MEDICATED_PATCH | TRANSDERMAL | Status: DC

## 2013-04-24 NOTE — Telephone Encounter (Signed)
OK.  Clonidine 0.2 patch sent.

## 2013-04-24 NOTE — Telephone Encounter (Signed)
Patient requesting clonidine patch 0.2mg  to be sent to [facility].  Please advise refill.

## 2013-05-15 ENCOUNTER — Encounter: Payer: Self-pay | Admitting: Family Medicine

## 2013-05-15 ENCOUNTER — Ambulatory Visit (INDEPENDENT_AMBULATORY_CARE_PROVIDER_SITE_OTHER): Payer: Medicare Other | Admitting: Family Medicine

## 2013-05-15 VITALS — BP 173/92 | HR 66 | Temp 98.8°F | Resp 18 | Ht 69.0 in | Wt 172.0 lb

## 2013-05-15 DIAGNOSIS — I495 Sick sinus syndrome: Secondary | ICD-10-CM

## 2013-05-15 DIAGNOSIS — E785 Hyperlipidemia, unspecified: Secondary | ICD-10-CM

## 2013-05-15 DIAGNOSIS — Z23 Encounter for immunization: Secondary | ICD-10-CM

## 2013-05-15 DIAGNOSIS — I10 Essential (primary) hypertension: Secondary | ICD-10-CM

## 2013-05-15 DIAGNOSIS — I455 Other specified heart block: Secondary | ICD-10-CM

## 2013-05-15 LAB — CBC WITH DIFFERENTIAL/PLATELET
BASOS ABS: 0 10*3/uL (ref 0.0–0.1)
Basophils Relative: 0.5 % (ref 0.0–3.0)
Eosinophils Absolute: 0.1 10*3/uL (ref 0.0–0.7)
Eosinophils Relative: 2.2 % (ref 0.0–5.0)
HEMATOCRIT: 45.6 % (ref 39.0–52.0)
HEMOGLOBIN: 14.8 g/dL (ref 13.0–17.0)
Lymphocytes Relative: 31.1 % (ref 12.0–46.0)
Lymphs Abs: 2.1 10*3/uL (ref 0.7–4.0)
MCHC: 32.5 g/dL (ref 30.0–36.0)
MCV: 90.9 fl (ref 78.0–100.0)
MONOS PCT: 5.7 % (ref 3.0–12.0)
Monocytes Absolute: 0.4 10*3/uL (ref 0.1–1.0)
Neutro Abs: 4 10*3/uL (ref 1.4–7.7)
Neutrophils Relative %: 60.5 % (ref 43.0–77.0)
PLATELETS: 366 10*3/uL (ref 150.0–400.0)
RBC: 5.01 Mil/uL (ref 4.22–5.81)
RDW: 13.3 % (ref 11.5–14.6)
WBC: 6.7 10*3/uL (ref 4.5–10.5)

## 2013-05-15 LAB — COMPREHENSIVE METABOLIC PANEL
ALBUMIN: 4.2 g/dL (ref 3.5–5.2)
ALT: 36 U/L (ref 0–53)
AST: 33 U/L (ref 0–37)
Alkaline Phosphatase: 102 U/L (ref 39–117)
BUN: 12 mg/dL (ref 6–23)
CALCIUM: 9.5 mg/dL (ref 8.4–10.5)
CHLORIDE: 100 meq/L (ref 96–112)
CO2: 28 meq/L (ref 19–32)
Creatinine, Ser: 0.9 mg/dL (ref 0.4–1.5)
GFR: 85.45 mL/min (ref >60.00)
GLUCOSE: 86 mg/dL (ref 70–99)
POTASSIUM: 4 meq/L (ref 3.5–5.1)
Sodium: 138 mEq/L (ref 135–145)
TOTAL PROTEIN: 7.8 g/dL (ref 6.0–8.3)
Total Bilirubin: 0.7 mg/dL (ref 0.3–1.2)

## 2013-05-15 LAB — LIPID PANEL
CHOL/HDL RATIO: 3
Cholesterol: 117 mg/dL (ref 0–200)
HDL: 35 mg/dL — AB (ref >39.00)
LDL Cholesterol: 61 mg/dL (ref 0–99)
Triglycerides: 105 mg/dL (ref 0.0–149.0)
VLDL: 21 mg/dL (ref 0.0–40.0)

## 2013-05-15 LAB — TSH: TSH: 1.69 u[IU]/mL (ref 0.35–5.50)

## 2013-05-15 NOTE — Progress Notes (Signed)
Pre visit review using our clinic review tool, if applicable. No additional management support is needed unless otherwise documented below in the visit note. 

## 2013-05-15 NOTE — Progress Notes (Signed)
Office Note 05/18/2013  CC:  Chief Complaint  Patient presents with  . Annual Exam  . Medication Problem    side effects to clonidine    HPI:  Billy Burns is a 73 y.o. White male who is here for f/u HTN, hx of sinus arrest (is s/p pacer insertion). Pt c/o dry mouth and throat, feeling tired, sexual dysfxn since getting on clonidine patch. BP at home is normal.   HR in 60s.   Just got 3 mo supply of clonidine through [facility] and prefers to continue on this med for now, re-eval in 47mo.  Recent URI/laryngitis illness that he is getting over. Sex drive dwindling plus some ED in last couple of months--coincident with changing to clonidine TTs-2. Some sleep issues last couple of months as well.  Had acute LBP and has been seeing a chiropracter and is improving.  Urologist is going to image his kidneys due to some microhematuria lately.   Had bladder bx: this was neg--in the last year or so.  Past Medical History  Diagnosis Date  . Prostate cancer 2008    Dr. Alinda Money  . Diverticulosis   . Hypertension   . Syncope     ? Malignant vasovagal syncope-Pacer placed  . Allergic angioedema     ACE inhibitor/beta blocker  . Sinus arrest   . Influenza A 02/2011  . Intermittent vertigo   . Headache(784.0)   . Gastroenteritis 2012  . Adenomatous colon polyp 06/05/11    Repeat 2018 per Dr. Carlean Purl  . Hematuria 01/06/2012    Dr. Orland Mustard, urologist in [place], did cysto and bladder bx 06/24/12 and it was unremarkable    Past Surgical History  Procedure Laterality Date  . Cataract extraction  1984, 1994    (lens implants bilat)  . Tonsillectomy  1950  . Radioactive seed implant      prostate 07/2009.  PSA dropped to below 2 after this (Dr. Harlow Asa at [facility] in [facility])  . Pacemaker insertion  02/2011    [employer]. Z610 Dual chamber; place in [place], [place] when pt visiting there for holiday 02/2011.  . Transthoracic echocardiogram  02/2011    Normal  .  Colonoscopy  06/05/11    75mm sigmoid polyp removed (+adenomatous), moderate diverticulosis, internal hemorrhoids.  Repeat 2018.    Family History  Problem Relation Age of Onset  . Stroke Mother     during endarterectomy  . Colon cancer Father   . Diabetes Neg Hx     History   Social History  . Marital Status: Married    Spouse Name: Billy Burns    Number of Children: 2  . Years of Education: N/A   Occupational History  . Retired     Chartered loss adjuster for [employer] History Main Topics  . Smoking status: Former Smoker    Quit date: 03/26/1964  . Smokeless tobacco: Never Used  . Alcohol Use: No  . Drug Use: No  . Sexual Activity: Not on file   Other Topics Concern  . Not on file   Social History Narrative   Married, retired from [employer] 2004.   No T/A/Ds.   Walks daily, push mows his yard.          Outpatient Prescriptions Prior to Visit  Medication Sig Dispense Refill  . acetaminophen (TYLENOL) 325 MG tablet Take 650 mg by mouth as needed.      . Alfalfa 500 MG TABS 9-12 TABS/DAY       .  aspirin EC 81 MG tablet Take 81 mg by mouth daily.        Marland Kitchen b complex vitamins tablet Take 1 tablet by mouth daily.      . carvedilol (COREG) 25 MG tablet Take 1 tablet (25 mg total) by mouth 2 (two) times daily with a meal.  180 tablet  1  . cholecalciferol (VITAMIN D) 1000 UNITS tablet Take 1,000 Units by mouth as needed.       . cloNIDine (CATAPRES - DOSED IN MG/24 HR) 0.2 mg/24hr patch Place 1 patch (0.2 mg total) onto the skin once a week.  12 patch  4  . Flaxseed, Linseed, (FLAX SEED OIL PO) Take 1,000 mg by mouth 2 (two) times daily.      . Magnesium Oxide 400 MG CAPS Take 1 capsule (400 mg total) by mouth daily.  30 capsule  6  . NON FORMULARY Take 2 tablets by mouth daily. Prostate Ess plus      . NON FORMULARY Take 4 tablets by mouth daily. osteomatrix       . pravastatin (PRAVACHOL) 20 MG tablet Take 1 tablet (20 mg total) by mouth daily.  90 tablet  1  .  vitamin C (ASCORBIC ACID) 500 MG tablet Take 500 mg by mouth as needed.        No facility-administered medications prior to visit.    Allergies  Allergen Reactions  . Ace Inhibitors Other (See Comments)    REACTION: Angioedema  . Aspirin Other (See Comments)    REACTION: Stomach Discomfort/TAKES 81 MG BUT CANT TAKE 325 MG ASA PER PT.  . Toprol Xl [Metoprolol Succinate] Other (See Comments)    Angioedema    ROS Review of Systems  Constitutional: Positive for fatigue (see hpi). Negative for fever, chills and appetite change.  HENT: Negative for congestion, dental problem, ear pain and sore throat.   Eyes: Negative for discharge, redness and visual disturbance.  Respiratory: Negative for cough, chest tightness, shortness of breath and wheezing.   Cardiovascular: Negative for chest pain, palpitations and leg swelling.  Gastrointestinal: Negative for nausea, vomiting, abdominal pain, diarrhea and blood in stool.  Genitourinary: Negative for dysuria, urgency, frequency, hematuria, flank pain and difficulty urinating.  Musculoskeletal: Negative for arthralgias, back pain, joint swelling, myalgias and neck stiffness.  Skin: Negative for pallor and rash.  Neurological: Negative for dizziness, speech difficulty, weakness and headaches.  Hematological: Negative for adenopathy. Does not bruise/bleed easily.  Psychiatric/Behavioral: Negative for confusion and sleep disturbance. The patient is not nervous/anxious.     PE; Blood pressure 173/92, pulse 66, temperature 98.8 F (37.1 C), temperature source Temporal, resp. rate 18, height 5\' 9"  (1.753 m), weight 172 lb (78.019 kg), SpO2 98.00%. Gen: Alert, well appearing.  Patient is oriented to person, place, time, and situation. AFFECT: pleasant, lucid thought and speech. ENT: Ears: EACs clear, normal epithelium.  TMs with good light reflex and landmarks bilaterally.  Eyes: no injection, icteris, swelling, or exudate.  EOMI, PERRLA. Nose: no  drainage or turbinate edema/swelling.  No injection or focal lesion.  Mouth: lips without lesion/swelling.  Oral mucosa pink and moist.  Dentition intact and without obvious caries or gingival swelling.  Oropharynx without erythema, exudate, or swelling.  Neck: supple/nontender.  No LAD, mass, or TM.   CV: RRR, no m/r/g.   LUNGS: CTA bilat, nonlabored resps, good aeration in all lung fields. ABD: soft, NT, ND, BS normal.  No hepatospenomegaly or mass.  No bruits. EXT: no clubbing, cyanosis, or  edema.  Musculoskeletal: no joint swelling, erythema, warmth, or tenderness.  ROM of all joints intact. Skin - no sores or suspicious lesions or rashes or color changes   Pertinent labs:  None today  ASSESSMENT AND PLAN:   HYPERTENSION Despite some side effects from catapres TTS 2 patch, pt wishes to ride this out a while longer and possibly switch to a different antihypertensive at next f/u.  Sinus arrest Has ICD. Doing well. Cont f/u with cardiologist.  Rojelio Brenner Tolerating statin. F/u FLP today.   An After Visit Summary was printed and given to the patient.  FOLLOW UP:  Return in about 3 months (around 08/12/2013) for f/u HTN.

## 2013-05-18 ENCOUNTER — Telehealth: Payer: Self-pay | Admitting: Family Medicine

## 2013-05-18 NOTE — Assessment & Plan Note (Signed)
Despite some side effects from catapres TTS 2 patch, pt wishes to ride this out a while longer and possibly switch to a different antihypertensive at next f/u.

## 2013-05-18 NOTE — Assessment & Plan Note (Signed)
Tolerating statin. F/u FLP today.

## 2013-05-18 NOTE — Telephone Encounter (Signed)
Relevant patient education assigned to patient using Emmi. ° °

## 2013-05-18 NOTE — Assessment & Plan Note (Signed)
Has ICD. Doing well. Cont f/u with cardiologist.

## 2013-06-01 ENCOUNTER — Encounter: Payer: Self-pay | Admitting: Family Medicine

## 2013-06-03 ENCOUNTER — Telehealth: Payer: Self-pay | Admitting: Family Medicine

## 2013-06-03 MED ORDER — CARVEDILOL 25 MG PO TABS: 25.0000 mg | ORAL_TABLET | Freq: Two times a day (BID) | ORAL | Status: DC

## 2013-06-03 NOTE — Telephone Encounter (Signed)
Medication sent to [facility].

## 2013-06-03 NOTE — Telephone Encounter (Signed)
Patient needs a refill on coreg 25 mg sent to [facility], Patient needs 90 day supply with refills

## 2013-06-08 ENCOUNTER — Encounter: Payer: Self-pay | Admitting: Family Medicine

## 2013-06-08 DIAGNOSIS — Z0279 Encounter for issue of other medical certificate: Secondary | ICD-10-CM

## 2013-06-10 ENCOUNTER — Telehealth: Payer: Self-pay | Admitting: Family Medicine

## 2013-06-10 NOTE — Telephone Encounter (Signed)
Patient is brushing his teeth earlier at night & is drinking more water. He states he is not having as much trouble with dry mouth.

## 2013-06-10 NOTE — Telephone Encounter (Signed)
Noted  

## 2013-08-12 ENCOUNTER — Encounter: Payer: Self-pay | Admitting: Family Medicine

## 2013-08-12 ENCOUNTER — Ambulatory Visit (INDEPENDENT_AMBULATORY_CARE_PROVIDER_SITE_OTHER): Payer: Medicare Other | Admitting: Family Medicine

## 2013-08-12 VITALS — BP 175/92 | HR 66 | Temp 98.6°F | Resp 18 | Ht 69.0 in | Wt 172.0 lb

## 2013-08-12 DIAGNOSIS — I10 Essential (primary) hypertension: Secondary | ICD-10-CM

## 2013-08-12 MED ORDER — PRAVASTATIN SODIUM 20 MG PO TABS: 20.0000 mg | ORAL_TABLET | Freq: Every day | ORAL | Status: DC

## 2013-08-12 MED ORDER — AMLODIPINE BESYLATE 2.5 MG PO TABS: 2.5000 mg | ORAL_TABLET | Freq: Every day | ORAL | Status: DC

## 2013-08-12 NOTE — Progress Notes (Signed)
OFFICE NOTE  08/12/2013  CC:  Chief Complaint  Patient presents with  . Hypertension     HPI: Patient is a 73 y.o. Caucasian male who is here for 3 mo f/u HTN.   Home bp avg 140s/70s. HR 60s-low 70s. Feeling good.  Walks total of 20-30 min/day, watching diet, push mows his grass. Does exercises via chiropracter.  Has f/u with cardiologist next month.  ROS: dry mouth --stable.  Nocturia frequency varies from once a night to 3-4 times a night. Still with some mild "background" dizziness when he gets up to quickly from bed in morning and starts walking too fast.  Pertinent PMH:  Past medical, surgical, social, and family history reviewed and no changes are noted since last office visit.  MEDS:  Outpatient Prescriptions Prior to Visit  Medication Sig Dispense Refill  . acetaminophen (TYLENOL) 325 MG tablet Take 650 mg by mouth as needed.      . Alfalfa 500 MG TABS 9-12 TABS/DAY       . aspirin EC 81 MG tablet Take 81 mg by mouth daily.        Marland Kitchen b complex vitamins tablet Take 1 tablet by mouth daily.      . carvedilol (COREG) 25 MG tablet Take 1 tablet (25 mg total) by mouth 2 (two) times daily with a meal.  180 tablet  1  . cholecalciferol (VITAMIN D) 1000 UNITS tablet Take 1,000 Units by mouth as needed.       . cloNIDine (CATAPRES - DOSED IN MG/24 HR) 0.2 mg/24hr patch Place 1 patch (0.2 mg total) onto the skin once a week.  12 patch  4  . Flaxseed, Linseed, (FLAX SEED OIL PO) Take 1,000 mg by mouth 2 (two) times daily.      . Magnesium Oxide 400 MG CAPS Take 1 capsule (400 mg total) by mouth daily.  30 capsule  6  . NON FORMULARY Take 2 tablets by mouth daily. Prostate Ess plus      . NON FORMULARY Take 4 tablets by mouth daily. osteomatrix       . vitamin C (ASCORBIC ACID) 500 MG tablet Take 500 mg by mouth as needed.       . pravastatin (PRAVACHOL) 20 MG tablet Take 1 tablet (20 mg total) by mouth daily.  90 tablet  1   No facility-administered medications prior to visit.     PE: Blood pressure 175/92, pulse 66, temperature 98.6 F (37 C), temperature source Temporal, resp. rate 18, height 5\' 9"  (1.753 m), weight 172 lb (78.019 kg), SpO2 97.00%. Gen: Alert, well appearing.  Patient is oriented to person, place, time, and situation. CV: RRR, no m/r/g.   LUNGS: CTA bilat, nonlabored resps, good aeration in all lung fields. EXT: no clubbing, cyanosis, or edema.   LABS: none today  IMPRESSION AND PLAN:  1) HTN, not ideal control as far as systolics go.  HR is good. Will add 2.5mg  of amlodipine daily.  Continue current dosing of clonidine and coreg. Labs 3 mo ago all excellent.  An After Visit Summary was printed and given to the patient.  FOLLOW UP: 1 mo

## 2013-08-12 NOTE — Progress Notes (Signed)
Pre visit review using our clinic review tool, if applicable. No additional management support is needed unless otherwise documented below in the visit note. 

## 2013-09-02 ENCOUNTER — Ambulatory Visit (INDEPENDENT_AMBULATORY_CARE_PROVIDER_SITE_OTHER): Payer: Medicare Other | Admitting: *Deleted

## 2013-09-02 DIAGNOSIS — Z950 Presence of cardiac pacemaker: Secondary | ICD-10-CM

## 2013-09-02 DIAGNOSIS — I495 Sick sinus syndrome: Secondary | ICD-10-CM

## 2013-09-02 LAB — MDC_IDC_ENUM_SESS_TYPE_INCLINIC
Implantable Pulse Generator Serial Number: 120835
Lead Channel Impedance Value: 554 Ohm
Lead Channel Pacing Threshold Amplitude: 0.8 V
Lead Channel Pacing Threshold Amplitude: 1.1 V
Lead Channel Pacing Threshold Pulse Width: 0.4 ms
Lead Channel Sensing Intrinsic Amplitude: 24.5 mV
Lead Channel Sensing Intrinsic Amplitude: 9.6 mV
Lead Channel Setting Pacing Amplitude: 2 V
Lead Channel Setting Pacing Pulse Width: 0.4 ms
Lead Channel Setting Sensing Sensitivity: 2.5 mV
MDC IDC MSMT LEADCHNL RA IMPEDANCE VALUE: 472 Ohm
MDC IDC MSMT LEADCHNL RV PACING THRESHOLD PULSEWIDTH: 0.4 ms
MDC IDC SESS DTM: 20150610040000
MDC IDC SET LEADCHNL RV PACING AMPLITUDE: 2.4 V
MDC IDC SET ZONE DETECTION INTERVAL: 375 ms
MDC IDC STAT BRADY RA PERCENT PACED: 28 %
MDC IDC STAT BRADY RV PERCENT PACED: 7 %

## 2013-09-02 NOTE — Progress Notes (Signed)
Pacemaker check in clinic. Normal device function. Thresholds, sensing, impedances consistent with previous measurements. Device programmed to maximize longevity. 2 ATR, both <33min. 3 NSVT---all were SVTs. Device programmed at appropriate safety margins. Histogram distribution appropriate for patient activity level. Device programmed to optimize intrinsic conduction. Estimated longevity 8.62yrs. ROV w/ BE 12/15.

## 2013-09-03 ENCOUNTER — Encounter: Payer: Self-pay | Admitting: Family Medicine

## 2013-09-03 ENCOUNTER — Ambulatory Visit (INDEPENDENT_AMBULATORY_CARE_PROVIDER_SITE_OTHER): Payer: Medicare Other | Admitting: Family Medicine

## 2013-09-03 VITALS — BP 165/82 | HR 69 | Temp 98.8°F | Resp 18 | Ht 69.0 in | Wt 172.0 lb

## 2013-09-03 DIAGNOSIS — I10 Essential (primary) hypertension: Secondary | ICD-10-CM

## 2013-09-03 MED ORDER — AMLODIPINE BESYLATE 2.5 MG PO TABS: 2.5000 mg | ORAL_TABLET | Freq: Every day | ORAL | Status: DC

## 2013-09-03 NOTE — Progress Notes (Signed)
Pre visit review using our clinic review tool, if applicable. No additional management support is needed unless otherwise documented below in the visit note. 

## 2013-09-03 NOTE — Progress Notes (Signed)
OFFICE NOTE  09/03/2013  CC: F/u HTN  HPI: Patient is a 73 y.o. Caucasian male who is here for 1 mo f/u HTN. Feeling good on new med, normal bp readings and HR's in 60s at home. He got pacer check yesterday and all was good. He is happy.   Says disequilibrium has not been a problem since starting amlodipine.   Pertinent PMH:  Past medical, surgical, social, and family history reviewed and no changes are noted since last office visit.  MEDS:  Outpatient Prescriptions Prior to Visit  Medication Sig Dispense Refill  . acetaminophen (TYLENOL) 325 MG tablet Take 500 mg by mouth as needed.       . Alfalfa 500 MG TABS 9-12 TABS/DAY       . amLODipine (NORVASC) 2.5 MG tablet Take 1 tablet (2.5 mg total) by mouth daily.  30 tablet  1  . aspirin EC 81 MG tablet Take 81 mg by mouth daily.        Marland Kitchen b complex vitamins tablet Take 1 tablet by mouth daily.      . carvedilol (COREG) 25 MG tablet Take 1 tablet (25 mg total) by mouth 2 (two) times daily with a meal.  180 tablet  1  . cholecalciferol (VITAMIN D) 1000 UNITS tablet Take 1,000 Units by mouth as needed.       . cloNIDine (CATAPRES - DOSED IN MG/24 HR) 0.2 mg/24hr patch Place 1 patch (0.2 mg total) onto the skin once a week.  12 patch  4  . Flaxseed, Linseed, (FLAX SEED OIL PO) Take 1,000 mg by mouth 2 (two) times daily.      . Magnesium Oxide 400 MG CAPS Take 1 capsule (400 mg total) by mouth daily.  30 capsule  6  . NON FORMULARY Take 2 tablets by mouth daily. Prostate Ess plus      . NON FORMULARY Take 4 tablets by mouth daily. osteomatrix       . pravastatin (PRAVACHOL) 20 MG tablet Take 1 tablet (20 mg total) by mouth daily.  90 tablet  3  . vitamin C (ASCORBIC ACID) 500 MG tablet Take 500 mg by mouth as needed.        No facility-administered medications prior to visit.    PE: Blood pressure 165/82, pulse 69, temperature 98.8 F (37.1 C), temperature source Temporal, resp. rate 18, height 5\' 9"  (1.753 m), weight 172 lb (78.019  kg), SpO2 96.00%. Gen: Alert, well appearing.  Patient is oriented to person, place, time, and situation. CV: RRR, no m/r/g.   LUNGS: CTA bilat, nonlabored resps, good aeration in all lung fields. EXT: no c/c/e  IMPRESSION AND PLAN:  1) HTN; The current medical regimen is effective;  continue present plan and medications. Tolerating recent addition of amlodipine 2.5mg  very well.  An After Visit Summary was printed and given to the patient.   FOLLOW UP: 6 mo

## 2013-10-07 ENCOUNTER — Encounter: Payer: Self-pay | Admitting: Internal Medicine

## 2013-11-16 ENCOUNTER — Other Ambulatory Visit: Payer: Self-pay | Admitting: Family Medicine

## 2013-11-16 MED ORDER — CARVEDILOL 25 MG PO TABS: 25.0000 mg | ORAL_TABLET | Freq: Two times a day (BID) | ORAL | Status: DC

## 2014-02-23 ENCOUNTER — Ambulatory Visit (INDEPENDENT_AMBULATORY_CARE_PROVIDER_SITE_OTHER): Payer: Medicare Other | Admitting: Internal Medicine

## 2014-02-23 VITALS — BP 158/84 | HR 64 | Ht 69.0 in | Wt 173.0 lb

## 2014-02-23 DIAGNOSIS — Z950 Presence of cardiac pacemaker: Secondary | ICD-10-CM

## 2014-02-23 DIAGNOSIS — I455 Other specified heart block: Secondary | ICD-10-CM

## 2014-02-23 DIAGNOSIS — I10 Essential (primary) hypertension: Secondary | ICD-10-CM

## 2014-02-23 DIAGNOSIS — Z45018 Encounter for adjustment and management of other part of cardiac pacemaker: Secondary | ICD-10-CM

## 2014-02-23 DIAGNOSIS — I495 Sick sinus syndrome: Secondary | ICD-10-CM

## 2014-02-23 LAB — MDC_IDC_ENUM_SESS_TYPE_INCLINIC
Brady Statistic RA Percent Paced: 28 %
Brady Statistic RV Percent Paced: 8 %
Implantable Pulse Generator Serial Number: 120835
Lead Channel Pacing Threshold Amplitude: 1 V
Lead Channel Pacing Threshold Pulse Width: 0.4 ms
Lead Channel Sensing Intrinsic Amplitude: 23.1 mV
Lead Channel Sensing Intrinsic Amplitude: 7.6 mV
Lead Channel Setting Pacing Amplitude: 2 V
Lead Channel Setting Pacing Pulse Width: 0.4 ms
Lead Channel Setting Sensing Sensitivity: 2.5 mV
MDC IDC MSMT LEADCHNL RA IMPEDANCE VALUE: 469 Ohm
MDC IDC MSMT LEADCHNL RV IMPEDANCE VALUE: 536 Ohm
MDC IDC MSMT LEADCHNL RV PACING THRESHOLD AMPLITUDE: 1 V
MDC IDC MSMT LEADCHNL RV PACING THRESHOLD PULSEWIDTH: 0.4 ms
MDC IDC SESS DTM: 20151201050000
MDC IDC SET LEADCHNL RV PACING AMPLITUDE: 2.4 V
Zone Setting Detection Interval: 375 ms

## 2014-02-23 NOTE — Progress Notes (Signed)
Patient Care Team: Tammi Sou, MD as PCP - General (Family Medicine) Milana Na, MD as Consulting Physician (Urology) Deboraha Sprang, MD (Cardiology) and   HPI  Billy Burns is a 73 y.o. 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  Because of the likelihood that there was a GI trigger given the associated symptoms, he was to be seen by GI. No specific finding was noted. He underwent a colonoscopy that was negative.  He has had one intercurrent spell characterized by dry mouth and pallor but it was not associated with GI symptoms and syncope.  .  Otherwise he denies  chest pain edema or palpitations.   He has spells of orthostatic lightheadedness. These occur following exercise as well as following squatting. Occasionally happens when he gets up from the bed in the morning. He has not had any syncope.  He says his blood pressures at home are most in the 130 range although 150 is not unheard of.       Past Medical History  Diagnosis Date  . Prostate cancer 2007    Dr. Harlow Asa ([facility] in [facility])  Pt is s/p brachytherapy 2011.  Jan 2015 PSA 0.6 ng/ml.  . Diverticulosis   . Hypertension   . Syncope     ? Malignant vasovagal syncope-Pacer placed  . Allergic angioedema     ACE inhibitor/beta blocker  . Sinus arrest   . Influenza A 02/2011  . Intermittent vertigo   . Headache(784.0)   . Gastroenteritis 2012  . Adenomatous colon polyp 06/05/11    Repeat 2018 per Dr. Carlean Purl  . Microhematuria 01/06/2012    Dr. Orland Mustard, urologist in [place], did cysto and bladder bx 06/24/12 and it was unremarkable  . Hyperlipidemia     Past Surgical History  Procedure Laterality Date  . Cataract extraction  1984, 1994    (lens implants bilat)  . Tonsillectomy  1950  . Radioactive seed implant      prostate 07/2009.  PSA dropped to below 2 after this (Dr. Harlow Asa at [facility] in  [facility])  . Pacemaker insertion  02/2011    [employer]. O671 Dual chamber; place in [place], [place] when pt visiting there for holiday 02/2011.  . Transthoracic echocardiogram  02/2011    Normal  . Colonoscopy  06/05/11    59mm sigmoid polyp removed (+adenomatous), moderate diverticulosis, internal hemorrhoids.  Repeat 2018.  Marland Kitchen Cystostomy w/ bladder biopsy  06/24/12    Benign    Current Outpatient Prescriptions  Medication Sig Dispense Refill  . acetaminophen (TYLENOL) 325 MG tablet Take 500 mg by mouth as needed.     . Alfalfa 500 MG TABS 9-12 TABS/DAY     . amLODipine (NORVASC) 2.5 MG tablet Take 1 tablet (2.5 mg total) by mouth daily. 90 tablet 3  . aspirin EC 81 MG tablet Take 81 mg by mouth daily.      Marland Kitchen b complex vitamins tablet Take 1 tablet by mouth daily.    . carvedilol (COREG) 25 MG tablet Take 1 tablet (25 mg total) by mouth 2 (two) times daily with a meal. 180 tablet 1  . cholecalciferol (VITAMIN D) 1000 UNITS tablet Take 1,000 Units by mouth as needed.     . cloNIDine (CATAPRES - DOSED IN MG/24 HR) 0.2 mg/24hr patch Place 1 patch (0.2 mg total) onto the skin once a week. 12 patch 4  . Flaxseed,  Linseed, (FLAX SEED OIL PO) Take 1,000 mg by mouth 2 (two) times daily.    . Magnesium Oxide 400 MG CAPS Take 1 capsule (400 mg total) by mouth daily. 30 capsule 6  . NON FORMULARY Take 2 tablets by mouth daily. Prostate Ess plus    . NON FORMULARY Take 4 tablets by mouth daily. osteomatrix     . pravastatin (PRAVACHOL) 20 MG tablet Take 1 tablet (20 mg total) by mouth daily. 90 tablet 3  . vitamin C (ASCORBIC ACID) 500 MG tablet Take 500 mg by mouth as needed.      No current facility-administered medications for this visit.    Allergies  Allergen Reactions  . Ace Inhibitors Other (See Comments)    REACTION: Angioedema  . Aspirin Other (See Comments)    REACTION: Stomach Discomfort/TAKES 81 MG BUT CANT TAKE 325 MG ASA PER PT.  . Toprol Xl [Metoprolol Succinate] Other (See  Comments)    Angioedema    Review of Systems negative except from HPI and PMH  Physical Exam BP 158/84 mmHg  Pulse 64  Ht 5\' 9"  (1.753 m)  Wt 173 lb (78.472 kg)  BMI 25.54 kg/m2 Well developed and well nourished in no acute distress HENT normal E scleral and icterus clear Neck Supple JVP flat; carotids brisk and full Clear Device pocket well healed; without hematoma or erythema.  There is no tethering   Regular rate and rhythm, no murmurs gallops or rub Soft with active bowel sounds No clubbing cyanosis none Edema Alert and oriented, grossly normal motor and sensory function Skin Warm and Dry  ECG  Atrial pacing with intrinsic conduction Assessment and  Plan  Sinus node dysfunction  Pacemaker The patient's device was interrogated.  The information was reviewed. No changes were made in the programming.    Hypertension  Orthostatic intolerance  Erectile dysfunction  The patient's arrhythmias are largely stable. Device function is normal rate excursion is adequate there is no significant intercurrent atrial fibrillation  We had a lengthy discussion regarding orthostatic intolerance is mechanisms and aggravating factors. I've advised him to 1-elevated head of his bed, 2-use isometric contraction to mitigate symptoms 3-be attentive to volume status and Ambient heat. 4-anticipate that phosphodiesterase inhibitors which would otherwise be appropriate for erectile dysfunction this patient might aggravate his symptoms.  His blood pressure may well need further medication; however, I would like to try and address the orthostatic symptoms prior to up titration of his amlodipine. Blood pressures over the 130-40 range  We spent more than 50% of our >25 min visit in face to face counseling regarding the above

## 2014-02-23 NOTE — Patient Instructions (Signed)
Your physician recommends that you continue on your current medications as directed. Please refer to the Current Medication list given to you today.  Your physician wants you to follow-up in: 1 year with Dr. Klein.  You will receive a reminder letter in the mail two months in advance. If you don't receive a letter, please call our office to schedule the follow-up appointment.  

## 2014-03-05 ENCOUNTER — Ambulatory Visit: Payer: Medicare Other | Admitting: Family Medicine

## 2014-03-05 ENCOUNTER — Encounter: Payer: Medicare Other | Admitting: Internal Medicine

## 2014-03-08 ENCOUNTER — Encounter: Payer: Self-pay | Admitting: Internal Medicine

## 2014-03-08 ENCOUNTER — Ambulatory Visit: Payer: Medicare Other | Admitting: Family Medicine

## 2014-03-09 ENCOUNTER — Encounter: Payer: Self-pay | Admitting: *Deleted

## 2014-03-18 ENCOUNTER — Encounter: Payer: Medicare Other | Admitting: Internal Medicine

## 2014-03-22 ENCOUNTER — Telehealth: Payer: Self-pay | Admitting: Internal Medicine

## 2014-03-22 NOTE — Telephone Encounter (Signed)
New message    Is it okay for patient to wear Fit bit.

## 2014-03-23 NOTE — Telephone Encounter (Signed)
Yes it is okay to wear the fit bit.  Wife aware

## 2014-03-31 ENCOUNTER — Ambulatory Visit (INDEPENDENT_AMBULATORY_CARE_PROVIDER_SITE_OTHER): Payer: Medicare Other | Admitting: Family Medicine

## 2014-03-31 ENCOUNTER — Encounter: Payer: Self-pay | Admitting: Family Medicine

## 2014-03-31 VITALS — BP 144/77 | HR 70 | Temp 97.2°F | Ht 69.0 in | Wt 171.0 lb

## 2014-03-31 DIAGNOSIS — E785 Hyperlipidemia, unspecified: Secondary | ICD-10-CM

## 2014-03-31 DIAGNOSIS — I10 Essential (primary) hypertension: Secondary | ICD-10-CM

## 2014-03-31 NOTE — Progress Notes (Signed)
Pre visit review using our clinic review tool, if applicable. No additional management support is needed unless otherwise documented below in the visit note. 

## 2014-03-31 NOTE — Progress Notes (Signed)
OFFICE NOTE  03/31/2014  CC:  Chief Complaint  Patient presents with  . Follow-up    6 month   HPI: Patient is a 74 y.o. Caucasian male who is here for 6 mo f/u HTN, hyperlipidemia, hx of sinus arrest and now s/p ICD/pacer. Reviewed home BP's and these are normal, HR avg low 60s. Energy decent "but not like when I was younger". Mild ED problem, says erection doesn't last as long as in the past.  No ready to try ED med at this time. He is not fasting today.   Pertinent PMH:  Past medical, surgical, social, and family history reviewed and no changes are noted since last office visit.  MEDS:  Outpatient Prescriptions Prior to Visit  Medication Sig Dispense Refill  . acetaminophen (TYLENOL) 325 MG tablet Take 500 mg by mouth as needed.     . Alfalfa 500 MG TABS 9-12 TABS/DAY     . amLODipine (NORVASC) 2.5 MG tablet Take 1 tablet (2.5 mg total) by mouth daily. 90 tablet 3  . aspirin EC 81 MG tablet Take 81 mg by mouth daily.      Marland Kitchen b complex vitamins tablet Take 1 tablet by mouth daily.    . carvedilol (COREG) 25 MG tablet Take 1 tablet (25 mg total) by mouth 2 (two) times daily with a meal. 180 tablet 1  . cholecalciferol (VITAMIN D) 1000 UNITS tablet Take 1,000 Units by mouth as needed.     . cloNIDine (CATAPRES - DOSED IN MG/24 HR) 0.2 mg/24hr patch Place 1 patch (0.2 mg total) onto the skin once a week. 12 patch 4  . Flaxseed, Linseed, (FLAX SEED OIL PO) Take 1,000 mg by mouth 2 (two) times daily.    . Magnesium Oxide 400 MG CAPS Take 1 capsule (400 mg total) by mouth daily. 30 capsule 6  . NON FORMULARY Take 2 tablets by mouth daily. Prostate Ess plus    . NON FORMULARY Take 4 tablets by mouth daily. osteomatrix     . pravastatin (PRAVACHOL) 20 MG tablet Take 1 tablet (20 mg total) by mouth daily. 90 tablet 3  . vitamin C (ASCORBIC ACID) 500 MG tablet Take 500 mg by mouth as needed.      No facility-administered medications prior to visit.    PE: Blood pressure 144/77, pulse  70, temperature 97.2 F (36.2 C), temperature source Temporal, height 5\' 9"  (1.753 m), weight 171 lb (77.565 kg), SpO2 96 %. Gen: Alert, well appearing.  Patient is oriented to person, place, time, and situation. CV: RRR, no m/r/g.   LUNGS: CTA bilat, nonlabored resps, good aeration in all lung fields. EXT: no clubbing or cyanosis.  Trace bilat LL pitting edema.  He is getting a lift insert for right foot/shoe due to leg length discrepancy (Dr. Emily Filbert, chiropracter, is doing this for him).  LABS: none today RECENT:  Lab Results  Component Value Date   CHOL 117 05/15/2013   HDL 35.00* 05/15/2013   LDLCALC 61 05/15/2013   LDLDIRECT 82.1 09/30/2006   TRIG 105.0 05/15/2013   CHOLHDL 3 05/15/2013     Chemistry      Component Value Date/Time   NA 138 05/15/2013 1023   K 4.0 05/15/2013 1023   CL 100 05/15/2013 1023   CO2 28 05/15/2013 1023   BUN 12 05/15/2013 1023   CREATININE 0.9 05/15/2013 1023   CREATININE 1.01 01/04/2012 1657      Component Value Date/Time   CALCIUM 9.5 05/15/2013 1023  ALKPHOS 102 05/15/2013 1023   AST 33 05/15/2013 1023   ALT 36 05/15/2013 1023   BILITOT 0.7 05/15/2013 1023     Lab Results  Component Value Date   TSH 1.69 05/15/2013   Lab Results  Component Value Date   WBC 6.7 05/15/2013   HGB 14.8 05/15/2013   HCT 45.6 05/15/2013   MCV 90.9 05/15/2013   PLT 366.0 05/15/2013    IMPRESSION AND PLAN:  1) Hypertension; The current medical regimen is effective;  continue present plan and medications. We'll repeat lytes/cr at f/u soon when he comes in fasting for his annual medicare wellness visit.  2) Hyperlipidemia: tolerating statin.   FLP planned for next month at [facility]--he is not fasting today.  An After Visit Summary was printed and given to the patient.  FOLLOW UP:  At pt's convenience for [facility] (fasting)

## 2014-05-18 ENCOUNTER — Ambulatory Visit (INDEPENDENT_AMBULATORY_CARE_PROVIDER_SITE_OTHER): Payer: Medicare Other | Admitting: Family Medicine

## 2014-05-18 ENCOUNTER — Encounter: Payer: Self-pay | Admitting: Family Medicine

## 2014-05-18 VITALS — BP 165/80 | HR 65 | Temp 98.4°F | Resp 20 | Ht 69.0 in | Wt 172.0 lb

## 2014-05-18 DIAGNOSIS — E785 Hyperlipidemia, unspecified: Secondary | ICD-10-CM

## 2014-05-18 DIAGNOSIS — Z0000 Encounter for general adult medical examination without abnormal findings: Secondary | ICD-10-CM | POA: Diagnosis not present

## 2014-05-18 LAB — COMPREHENSIVE METABOLIC PANEL
ALBUMIN: 4.4 g/dL (ref 3.5–5.2)
ALK PHOS: 86 U/L (ref 39–117)
ALT: 19 U/L (ref 0–53)
AST: 22 U/L (ref 0–37)
BILIRUBIN TOTAL: 0.8 mg/dL (ref 0.2–1.2)
BUN: 13 mg/dL (ref 6–23)
CO2: 31 mEq/L (ref 19–32)
Calcium: 9.5 mg/dL (ref 8.4–10.5)
Chloride: 103 mEq/L (ref 96–112)
Creatinine, Ser: 1.03 mg/dL (ref 0.40–1.50)
GFR: 74.8 mL/min (ref >60.00)
Glucose, Bld: 99 mg/dL (ref 70–99)
Potassium: 4.2 mEq/L (ref 3.5–5.1)
Sodium: 138 mEq/L (ref 135–145)
TOTAL PROTEIN: 7.1 g/dL (ref 6.0–8.3)

## 2014-05-18 LAB — CBC WITH DIFFERENTIAL/PLATELET
BASOS ABS: 0 10*3/uL (ref 0.0–0.1)
Basophils Relative: 0.4 % (ref 0.0–3.0)
EOS ABS: 0.3 10*3/uL (ref 0.0–0.7)
Eosinophils Relative: 3.8 % (ref 0.0–5.0)
HCT: 43.2 % (ref 39.0–52.0)
Hemoglobin: 14.7 g/dL (ref 13.0–17.0)
Lymphocytes Relative: 25.3 % (ref 12.0–46.0)
Lymphs Abs: 2 10*3/uL (ref 0.7–4.0)
MCHC: 33.9 g/dL (ref 30.0–36.0)
MCV: 87.2 fl (ref 78.0–100.0)
Monocytes Absolute: 0.5 10*3/uL (ref 0.1–1.0)
Monocytes Relative: 6.6 % (ref 3.0–12.0)
NEUTROS PCT: 63.9 % (ref 43.0–77.0)
Neutro Abs: 4.9 10*3/uL (ref 1.4–7.7)
PLATELETS: 304 10*3/uL (ref 150.0–400.0)
RBC: 4.96 Mil/uL (ref 4.22–5.81)
RDW: 13.8 % (ref 11.5–15.5)
WBC: 7.7 10*3/uL (ref 4.0–10.5)

## 2014-05-18 LAB — LIPID PANEL
CHOL/HDL RATIO: 3
Cholesterol: 125 mg/dL (ref 0–200)
HDL: 45.1 mg/dL (ref >39.00)
LDL Cholesterol: 59 mg/dL (ref 0–99)
NonHDL: 79.9
TRIGLYCERIDES: 103 mg/dL (ref 0.0–149.0)
VLDL: 20.6 mg/dL (ref 0.0–40.0)

## 2014-05-18 LAB — TSH: TSH: 2.4 u[IU]/mL (ref 0.35–4.50)

## 2014-05-18 NOTE — Progress Notes (Signed)
Pre visit review using our clinic review tool, if applicable. No additional management support is needed unless otherwise documented below in the visit note. 

## 2014-05-18 NOTE — Progress Notes (Signed)
Office Note 05/18/2014  CC:  Chief Complaint  Patient presents with  . Annual Exam    fasting    HPI:  Billy Burns is a 74 y.o. White male who is here for health maintenance exam. Pt has Urologist, Dr. Orland Mustard, whom he sees for hx of prostate ca, so he does not need DRE/PSA today.  Has f/u with cardiologist 08/2014.  At last f/u there his defibrillator/pacer was working well but had not been needed any per the recording. Had f/u with Urologist recently and no blood work was done/no new therapies.  Home bp monitoring shows all bps in normal range.  Had trouble maintaining erection once in the last month, nothing persistent.  Sexual desire intact.  He just wanted my advice, wanted me to know in case he asks for ED med in the future. Exercise: walking > 1 mile daily.  Also does back stretches per his chiropracter's rec's every night.  Gets regular dental care.   Past Medical History  Diagnosis Date  . Prostate cancer 2007    Dr. Harlow Asa ([facility] in [facility])  Pt is s/p brachytherapy 2011.  Jan 2015 PSA 0.6 ng/ml.  . Diverticulosis   . Hypertension   . Syncope     ? Malignant vasovagal syncope-Pacer placed  . Allergic angioedema     ACE inhibitor/beta blocker  . Sinus arrest   . Influenza A 02/2011  . Intermittent vertigo   . Headache(784.0)   . Gastroenteritis 2012  . Adenomatous colon polyp 06/05/11    Repeat 2018 per Dr. Carlean Purl  . Microhematuria 01/06/2012    Dr. Orland Mustard, urologist in [place], did cysto and bladder bx 06/24/12 and it was unremarkable  . Hyperlipidemia     Past Surgical History  Procedure Laterality Date  . Cataract extraction  1984, 1994    (lens implants bilat)  . Tonsillectomy  1950  . Radioactive seed implant      prostate 07/2009.  PSA dropped to below 2 after this (Dr. Harlow Asa at [facility] in [facility])  . Pacemaker insertion  02/2011    [employer]. A128 Dual chamber; place in [place], [place] when pt visiting there for  holiday 02/2011.  . Transthoracic echocardiogram  02/2011    Normal  . Colonoscopy  06/05/11    52mm sigmoid polyp removed (+adenomatous), moderate diverticulosis, internal hemorrhoids.  Repeat 2018.  Marland Kitchen Cystostomy w/ bladder biopsy  06/24/12    Benign    Family History  Problem Relation Age of Onset  . Stroke Mother     during endarterectomy  . Colon cancer Father   . Diabetes Neg Hx     History   Social History  . Marital Status: Married    Spouse Name: Billy Burns  . Number of Children: 2  . Years of Education: N/A   Occupational History  . Retired     Chartered loss adjuster for [employer] History Main Topics  . Smoking status: Former Smoker    Quit date: 03/26/1964  . Smokeless tobacco: Never Used  . Alcohol Use: No  . Drug Use: No  . Sexual Activity: Not on file   Other Topics Concern  . Not on file   Social History Narrative   Married, retired from [employer] 2004.   No T/A/Ds.   Walks daily, push mows his yard.          Outpatient Prescriptions Prior to Visit  Medication Sig Dispense Refill  . acetaminophen (TYLENOL) 325 MG tablet  Take 500 mg by mouth as needed.     . Alfalfa 500 MG TABS 9-12 TABS/DAY     . amLODipine (NORVASC) 2.5 MG tablet Take 1 tablet (2.5 mg total) by mouth daily. 90 tablet 3  . aspirin EC 81 MG tablet Take 81 mg by mouth daily.      Marland Kitchen b complex vitamins tablet Take 1 tablet by mouth daily.    . carvedilol (COREG) 25 MG tablet Take 1 tablet (25 mg total) by mouth 2 (two) times daily with a meal. 180 tablet 1  . cholecalciferol (VITAMIN D) 1000 UNITS tablet Take 1,000 Units by mouth as needed.     . cloNIDine (CATAPRES - DOSED IN MG/24 HR) 0.2 mg/24hr patch Place 1 patch (0.2 mg total) onto the skin once a week. 12 patch 4  . Flaxseed, Linseed, (FLAX SEED OIL PO) Take 1,000 mg by mouth 2 (two) times daily.    . Magnesium Oxide 400 MG CAPS Take 1 capsule (400 mg total) by mouth daily. 30 capsule 6  . NON FORMULARY Take 2 tablets by  mouth daily. Prostate Ess plus    . NON FORMULARY Take 4 tablets by mouth daily. osteomatrix     . pravastatin (PRAVACHOL) 20 MG tablet Take 1 tablet (20 mg total) by mouth daily. 90 tablet 3  . vitamin C (ASCORBIC ACID) 500 MG tablet Take 500 mg by mouth as needed.      No facility-administered medications prior to visit.    Allergies  Allergen Reactions  . Ace Inhibitors Other (See Comments)    REACTION: Angioedema  . Aspirin Other (See Comments)    REACTION: Stomach Discomfort/TAKES 81 MG BUT CANT TAKE 325 MG ASA PER PT.  . Toprol Xl [Metoprolol Succinate] Other (See Comments)    Angioedema    ROS Review of Systems  Constitutional: Negative for fever, chills, appetite change and fatigue.  HENT: Negative for congestion, dental problem, ear pain and sore throat.   Eyes: Negative for discharge, redness and visual disturbance.  Respiratory: Negative for cough, chest tightness, shortness of breath and wheezing.   Cardiovascular: Negative for chest pain, palpitations and leg swelling.  Gastrointestinal: Negative for nausea, vomiting, abdominal pain, diarrhea and blood in stool.  Endocrine: Negative for cold intolerance, heat intolerance, polydipsia, polyphagia and polyuria.  Genitourinary: Negative for dysuria, urgency, frequency, hematuria, flank pain and difficulty urinating.  Musculoskeletal: Negative for myalgias, back pain, joint swelling, arthralgias and neck stiffness.  Skin: Negative for pallor and rash.  Neurological: Negative for dizziness, speech difficulty, weakness and headaches.  Hematological: Negative for adenopathy. Does not bruise/bleed easily.  Psychiatric/Behavioral: Negative for confusion and sleep disturbance. The patient is not nervous/anxious.     PE; Blood pressure 165/80, pulse 65, temperature 98.4 F (36.9 C), temperature source Temporal, resp. rate 20, height 5\' 9"  (1.753 m), weight 172 lb (78.019 kg), SpO2 98 %. Gen: Alert, well appearing.  Patient is  oriented to person, place, time, and situation. AFFECT: pleasant, lucid thought and speech. ENT: Ears: EACs clear, normal epithelium.  TMs with good light reflex and landmarks bilaterally.  Eyes: no injection, icteris, swelling, or exudate.  EOMI, PERRLA. Nose: no drainage or turbinate edema/swelling.  No injection or focal lesion.  Mouth: lips without lesion/swelling.  Oral mucosa pink and moist.  Dentition intact and without obvious caries or gingival swelling.  Oropharynx without erythema, exudate, or swelling.  Neck: supple/nontender.  No LAD, mass, or TM.  Carotid pulses 2+ bilaterally, without bruits. CV: RRR,  no m/r/g.   LUNGS: CTA bilat, nonlabored resps, good aeration in all lung fields. ABD: soft, NT, ND, BS normal.  No hepatospenomegaly or mass.  No bruits. EXT: no clubbing, cyanosis, or edema.  Musculoskeletal: no joint swelling, erythema, warmth, or tenderness.  ROM of all joints intact. Skin - no sores or suspicious lesions or rashes or color changes  Pertinent labs:  Labs: none today Recent: Lab Results  Component Value Date   TSH 1.69 05/15/2013   Lab Results  Component Value Date   WBC 6.7 05/15/2013   HGB 14.8 05/15/2013   HCT 45.6 05/15/2013   MCV 90.9 05/15/2013   PLT 366.0 05/15/2013   Lab Results  Component Value Date   CREATININE 0.9 05/15/2013   BUN 12 05/15/2013   NA 138 05/15/2013   K 4.0 05/15/2013   CL 100 05/15/2013   CO2 28 05/15/2013   Lab Results  Component Value Date   ALT 36 05/15/2013   AST 33 05/15/2013   ALKPHOS 102 05/15/2013   BILITOT 0.7 05/15/2013   Lab Results  Component Value Date   CHOL 117 05/15/2013   Lab Results  Component Value Date   HDL 35.00* 05/15/2013   Lab Results  Component Value Date   LDLCALC 61 05/15/2013   Lab Results  Component Value Date   TRIG 105.0 05/15/2013   Lab Results  Component Value Date   CHOLHDL 3 05/15/2013   Lab Results  Component Value Date   PSA 8.40* 11/17/2008     ASSESSMENT AND PLAN:   Health maintenance examination Reviewed age and gender appropriate health maintenance issues (prudent diet, regular exercise, health risks of tobacco and excessive alcohol, use of seatbelts, fire alarms in home, use of sunscreen).  Also reviewed age and gender appropriate health screening as well as vaccine recommendations. UTD on all vaccines. Next colonoscopy due 2018. No DRE or PSA needed today since he gets these through his urologist and all of this follow up has been excellent recently. [place] labs drawn today. Keep up the great work with diet/exercise. Advanced HC paperwork/living will paperwork given to patient today.    An After Visit Summary was printed and given to the patient.  FOLLOW UP:  Return in about 6 months (around 11/16/2014) for routine chronic illness f/u.

## 2014-05-18 NOTE — Assessment & Plan Note (Signed)
Reviewed age and gender appropriate health maintenance issues (prudent diet, regular exercise, health risks of tobacco and excessive alcohol, use of seatbelts, fire alarms in home, use of sunscreen).  Also reviewed age and gender appropriate health screening as well as vaccine recommendations. UTD on all vaccines. Next colonoscopy due 2018. No DRE or PSA needed today since he gets these through his urologist and all of this follow up has been excellent recently. [place] labs drawn today. Keep up the great work with diet/exercise. Advanced HC paperwork/living will paperwork given to patient today.

## 2014-06-02 ENCOUNTER — Telehealth: Payer: Self-pay | Admitting: Family Medicine

## 2014-06-02 MED ORDER — AMLODIPINE BESYLATE 2.5 MG PO TABS: 2.5000 mg | ORAL_TABLET | Freq: Every day | ORAL | Status: DC

## 2014-06-02 MED ORDER — CARVEDILOL 25 MG PO TABS: 25.0000 mg | ORAL_TABLET | Freq: Two times a day (BID) | ORAL | Status: DC

## 2014-06-02 NOTE — Telephone Encounter (Signed)
Pt. Called and needs a refill on his 2 BP meds. amlodipine and carvedilol. His insurance company is no longer using mail order drugs so he needs these called into the [facility] on [address]. Any questions please call him at [phone].

## 2014-06-22 ENCOUNTER — Other Ambulatory Visit: Payer: Self-pay | Admitting: Family Medicine

## 2014-06-22 MED ORDER — CLONIDINE HCL 0.2 MG/24HR TD PTWK: 0.2000 mg | MEDICATED_PATCH | TRANSDERMAL | Status: DC

## 2014-06-22 NOTE — Telephone Encounter (Signed)
Clonidine [facility] [place]

## 2014-06-22 NOTE — Telephone Encounter (Signed)
Please Advise Refill Request? Refill request for- Clonidine 0.2 patch Last filled by MD on - 04/24/13 Last Appt - 05/18/14        Next Appt - 11/10/14 Pharmacy- [facility]

## 2014-06-25 ENCOUNTER — Telehealth: Payer: Self-pay | Admitting: Family Medicine

## 2014-06-25 NOTE — Telephone Encounter (Signed)
Pt. Went to Pharmacy to pick up his RX for BP meds. He said it was to expense and was wondering if there was a cheaper alternative or could he take the pill version.  His Pharmacy is [facility] and his home number is [phone] if you need to talk to him.

## 2014-06-25 NOTE — Telephone Encounter (Signed)
Please advise 

## 2014-06-28 MED ORDER — CLONIDINE HCL 0.1 MG PO TABS: 0.1000 mg | ORAL_TABLET | Freq: Two times a day (BID) | ORAL | Status: DC

## 2014-06-28 NOTE — Telephone Encounter (Signed)
Left detailed message on pt's vm

## 2014-06-28 NOTE — Telephone Encounter (Signed)
May change from patch to the pill. Pls eRx clonidine generic 0.1mg  tabs, 1 tab po bid, #180, RF x 3.  Tell him to d/c patch and start this med.-thx

## 2014-08-27 ENCOUNTER — Ambulatory Visit (INDEPENDENT_AMBULATORY_CARE_PROVIDER_SITE_OTHER): Payer: Medicare Other | Admitting: *Deleted

## 2014-08-27 DIAGNOSIS — I495 Sick sinus syndrome: Secondary | ICD-10-CM

## 2014-08-27 LAB — CUP PACEART INCLINIC DEVICE CHECK
Battery Remaining Longevity: 7.5
Date Time Interrogation Session: 20160603040000
Lead Channel Impedance Value: 508 Ohm
Lead Channel Pacing Threshold Amplitude: 1 V
Lead Channel Pacing Threshold Pulse Width: 0.4 ms
Lead Channel Setting Pacing Amplitude: 2 V
Lead Channel Setting Pacing Amplitude: 2.4 V
Lead Channel Setting Sensing Sensitivity: 2.5 mV
MDC IDC MSMT LEADCHNL RA IMPEDANCE VALUE: 464 Ohm
MDC IDC MSMT LEADCHNL RA PACING THRESHOLD PULSEWIDTH: 0.4 ms
MDC IDC MSMT LEADCHNL RA SENSING INTR AMPL: 7.4 mV
MDC IDC MSMT LEADCHNL RV PACING THRESHOLD AMPLITUDE: 1.2 V
MDC IDC MSMT LEADCHNL RV SENSING INTR AMPL: 20 mV
MDC IDC PG SERIAL: 120835
MDC IDC SET LEADCHNL RV PACING PULSEWIDTH: 0.4 ms
MDC IDC STAT BRADY RA PERCENT PACED: 27 %
MDC IDC STAT BRADY RV PERCENT PACED: 7 %
Zone Setting Detection Interval: 375 ms

## 2014-08-27 NOTE — Progress Notes (Signed)
Pacemaker check in clinic. Normal device function. Thresholds, sensing, impedances consistent with previous measurements. Device programmed to maximize longevity. 1 ATR episode x 4 sec @ 203/84bpm---AT. No high ventricular rates noted. Device programmed at appropriate safety margins. Histogram distribution appropriate for patient activity level. Device programmed to optimize intrinsic conduction. Estimated longevity 7.5 years. Patient will follow up with SK in 02-2015.

## 2014-08-31 ENCOUNTER — Other Ambulatory Visit: Payer: Self-pay | Admitting: *Deleted

## 2014-08-31 MED ORDER — PRAVASTATIN SODIUM 20 MG PO TABS: 20.0000 mg | ORAL_TABLET | Freq: Every day | ORAL | Status: DC

## 2014-08-31 NOTE — Telephone Encounter (Signed)
Pt called requesting refill for Pravastatin. LOV 05/18/14, up coming ov 11/10/14, last written: 08/12/13 fw/ 3RF #90. Rx sent for #90 w/ 3RF.

## 2014-09-20 ENCOUNTER — Encounter: Payer: Self-pay | Admitting: Internal Medicine

## 2014-10-13 ENCOUNTER — Telehealth: Payer: Self-pay | Admitting: Internal Medicine

## 2014-10-13 NOTE — Telephone Encounter (Signed)
Walk in pt form-[employer]-papers dropped off gave to [place].

## 2014-10-20 NOTE — Telephone Encounter (Signed)
Left detailed message on personal voicemail explaining that paperwork was complete and left at front desk for him to pick up.

## 2014-11-10 ENCOUNTER — Encounter: Payer: Self-pay | Admitting: Family Medicine

## 2014-11-10 ENCOUNTER — Ambulatory Visit (INDEPENDENT_AMBULATORY_CARE_PROVIDER_SITE_OTHER): Payer: Medicare Other | Admitting: Family Medicine

## 2014-11-10 VITALS — BP 138/80 | HR 67 | Temp 97.9°F | Resp 16 | Ht 69.0 in | Wt 169.0 lb

## 2014-11-10 DIAGNOSIS — E785 Hyperlipidemia, unspecified: Secondary | ICD-10-CM

## 2014-11-10 DIAGNOSIS — I10 Essential (primary) hypertension: Secondary | ICD-10-CM | POA: Diagnosis not present

## 2014-11-10 NOTE — Progress Notes (Signed)
Pre visit review using our clinic review tool, if applicable. No additional management support is needed unless otherwise documented below in the visit note. 

## 2014-11-10 NOTE — Progress Notes (Signed)
OFFICE VISIT  11/10/2014   CC:  Chief Complaint  Patient presents with  . Follow-up    Pt is not fasting.   HPI:    Patient is a 74 y.o. Caucasian male who presents for 6 mo f/u HTN and hyperlipidemia. Compliant with bp and cholest meds. Has ICD for hx of sinus arrest of unclear etiology, keeps regular f/u with Dr. Caryl Comes for this.  No syncope or presyncope or palp's. No HA's, no CP or SOB.  Past Medical History  Diagnosis Date  . Prostate cancer 2007    Dr. Harlow Asa ([facility] in [facility])  Pt is s/p brachytherapy 2011.  Jan 2015 PSA 0.6 ng/ml.  . Diverticulosis   . Hypertension   . Syncope     ? Malignant vasovagal syncope-Pacer placed  . Allergic angioedema     ACE inhibitor/beta blocker  . Sinus arrest   . Influenza A 02/2011  . Intermittent vertigo   . Headache(784.0)   . Gastroenteritis 2012  . Adenomatous colon polyp 06/05/11    Repeat 2018 per Dr. Carlean Purl  . Microhematuria 01/06/2012    Dr. Orland Mustard, urologist in [place], did cysto and bladder bx 06/24/12 and it was unremarkable  . Hyperlipidemia     Past Surgical History  Procedure Laterality Date  . Cataract extraction  1984, 1994    (lens implants bilat)  . Tonsillectomy  1950  . Radioactive seed implant      prostate 07/2009.  PSA dropped to below 2 after this (Dr. Harlow Asa at [facility] in [facility])  . Pacemaker insertion  02/2011    [employer]. [id number] Dual chamber; place in [place], [place] when pt visiting there for holiday 02/2011.  . Transthoracic echocardiogram  02/2011    Normal  . Colonoscopy  06/05/11    6mm sigmoid polyp removed (+adenomatous), moderate diverticulosis, internal hemorrhoids.  Repeat 2018.  Marland Kitchen Cystostomy w/ bladder biopsy  06/24/12    Benign    Outpatient Prescriptions Prior to Visit  Medication Sig Dispense Refill  . acetaminophen (TYLENOL) 325 MG tablet Take 500 mg by mouth as needed.     . Alfalfa 500 MG TABS 9-12 TABS/DAY     . amLODipine (NORVASC) 2.5 MG tablet Take 1  tablet (2.5 mg total) by mouth daily. 90 tablet 1  . aspirin EC 81 MG tablet Take 81 mg by mouth daily.      Marland Kitchen b complex vitamins tablet Take 1 tablet by mouth daily.    . carvedilol (COREG) 25 MG tablet Take 1 tablet (25 mg total) by mouth 2 (two) times daily with a meal. 180 tablet 1  . cholecalciferol (VITAMIN D) 1000 UNITS tablet Take 1,000 Units by mouth daily.     . cloNIDine (CATAPRES) 0.1 MG tablet Take 1 tablet (0.1 mg total) by mouth 2 (two) times daily. 180 tablet 3  . Flaxseed, Linseed, (FLAX SEED OIL PO) Take 1,000 mg by mouth 2 (two) times daily.    . NON FORMULARY Take 2 tablets by mouth daily. Prostate Ess plus    . NON FORMULARY Take 4 tablets by mouth daily. osteomatrix     . pravastatin (PRAVACHOL) 20 MG tablet Take 1 tablet (20 mg total) by mouth daily. 90 tablet 3  . vitamin C (ASCORBIC ACID) 500 MG tablet Take 500 mg by mouth as needed.     . Magnesium Oxide 400 MG CAPS Take 1 capsule (400 mg total) by mouth daily. (Patient not taking: Reported on 11/10/2014) 30 capsule 6  No facility-administered medications prior to visit.    Allergies  Allergen Reactions  . Ace Inhibitors Other (See Comments)    REACTION: Angioedema  . Aspirin Other (See Comments)    REACTION: Stomach Discomfort/TAKES 81 MG BUT CANT TAKE 325 MG ASA PER PT.  . Toprol Xl [Metoprolol Succinate] Other (See Comments)    Angioedema    ROS As per HPI  PE: Blood pressure 146/76, pulse 67, temperature 97.9 F (36.6 C), temperature source Oral, resp. rate 16, height 5\' 9"  (1.753 m), weight 169 lb (76.658 kg), SpO2 96 %. BP recheck manual today was 138/80 Gen: Alert, well appearing.  Patient is oriented to person, place, time, and situation. CV: RRR, no m/r/g.   LUNGS: CTA bilat, nonlabored resps, good aeration in all lung fields. EXT: no clubbing, cyanosis, or edema.    LABS:  Lab Results  Component Value Date   TSH 2.40 05/18/2014   Lab Results  Component Value Date   WBC 7.7 05/18/2014    HGB 14.7 05/18/2014   HCT 43.2 05/18/2014   MCV 87.2 05/18/2014   PLT 304.0 05/18/2014   Lab Results  Component Value Date   CREATININE 1.03 05/18/2014   BUN 13 05/18/2014   NA 138 05/18/2014   K 4.2 05/18/2014   CL 103 05/18/2014   CO2 31 05/18/2014   Lab Results  Component Value Date   ALT 19 05/18/2014   AST 22 05/18/2014   ALKPHOS 86 05/18/2014   BILITOT 0.8 05/18/2014   Lab Results  Component Value Date   CHOL 125 05/18/2014   Lab Results  Component Value Date   HDL 45.10 05/18/2014   Lab Results  Component Value Date   LDLCALC 59 05/18/2014   Lab Results  Component Value Date   TRIG 103.0 05/18/2014   Lab Results  Component Value Date   CHOLHDL 3 05/18/2014   IMPRESSION AND PLAN:  1) HTN: The current medical regimen is effective;  continue present plan and medications.  2) Hyperlipidemia: tolerating statin.  Latest cholesterol panel and AST/ALT 04/2014 excellent. Will recheck this in 6 mo.  Filled out DMV form for pt today.  Spent 25 min with pt today, with >50% of this time spent in counseling and care coordination regarding the above problems.   An After Visit Summary was printed and given to the patient.  FOLLOW UP: Return in about 6 months (around 05/13/2015) for routine chronic illness f/u 30 min.

## 2014-11-30 ENCOUNTER — Other Ambulatory Visit: Payer: Self-pay | Admitting: *Deleted

## 2014-11-30 MED ORDER — CARVEDILOL 25 MG PO TABS: 25.0000 mg | ORAL_TABLET | Freq: Two times a day (BID) | ORAL | Status: DC

## 2014-11-30 NOTE — Telephone Encounter (Signed)
RF request for carvedilol LOV: 11/10/14 Next ov: None Last written: 06/02/14 #180 w/ 1RF

## 2014-12-01 ENCOUNTER — Other Ambulatory Visit: Payer: Self-pay | Admitting: *Deleted

## 2014-12-01 MED ORDER — AMLODIPINE BESYLATE 2.5 MG PO TABS: 2.5000 mg | ORAL_TABLET | Freq: Every day | ORAL | Status: DC

## 2014-12-01 NOTE — Telephone Encounter (Signed)
RF request for amlodipine LOV: 11/10/14 Next ov: None Last written: 06/02/14 #90 w/ 1RF

## 2015-03-08 ENCOUNTER — Encounter: Payer: Self-pay | Admitting: Internal Medicine

## 2015-03-08 ENCOUNTER — Ambulatory Visit (INDEPENDENT_AMBULATORY_CARE_PROVIDER_SITE_OTHER): Payer: Medicare Other | Admitting: Internal Medicine

## 2015-03-08 VITALS — BP 136/78 | HR 64 | Ht 69.5 in | Wt 173.0 lb

## 2015-03-08 DIAGNOSIS — Z950 Presence of cardiac pacemaker: Secondary | ICD-10-CM

## 2015-03-08 DIAGNOSIS — I495 Sick sinus syndrome: Secondary | ICD-10-CM

## 2015-03-08 NOTE — Progress Notes (Signed)
Patient Care Team: Tammi Sou, MD as PCP - General (Family Medicine) Milana Na, MD as Consulting Physician (Urology) Deboraha Sprang, MD (Cardiology) and   HPI  Billy Burns is a 74 y.o. 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  Because of the likelihood that there was a GI trigger given the associated symptoms, he was to be seen by GI. No specific finding was noted. He underwent a colonoscopy that was negative.  He has had  No intercurrent spells  .        Past Medical History  Diagnosis Date  . Prostate cancer [facility]) 2007    Dr. Harlow Asa ([facility] in [facility])  Pt is s/p brachytherapy 2011.  Jan 2015 PSA 0.6 ng/ml.  . Diverticulosis   . Hypertension   . Syncope     ? Malignant vasovagal syncope-Pacer placed  . Allergic angioedema     ACE inhibitor/beta blocker  . Sinus arrest   . Influenza A 02/2011  . Intermittent vertigo   . Headache(784.0)   . Gastroenteritis 2012  . Adenomatous colon polyp 06/05/11    Repeat 2018 per Dr. Carlean Purl  . Microhematuria 01/06/2012    Dr. Orland Mustard, urologist in [place], did cysto and bladder bx 06/24/12 and it was unremarkable  . Hyperlipidemia     Past Surgical History  Procedure Laterality Date  . Cataract extraction  1984, 1994    (lens implants bilat)  . Tonsillectomy  1950  . Radioactive seed implant      prostate 07/2009.  PSA dropped to below 2 after this (Dr. Harlow Asa at [facility] in [facility])  . Pacemaker insertion  02/2011    [employer]. [id number] Dual chamber; place in [place], [place] when pt visiting there for holiday 02/2011.  . Transthoracic echocardiogram  02/2011    Normal  . Colonoscopy  06/05/11    31mm sigmoid polyp removed (+adenomatous), moderate diverticulosis, internal hemorrhoids.  Repeat 2018.  Marland Kitchen Cystostomy w/ bladder biopsy  06/24/12    Benign    Current Outpatient Prescriptions  Medication Sig  Dispense Refill  . acetaminophen (TYLENOL) 325 MG tablet Take 500 mg by mouth as needed.     . Alfalfa 500 MG TABS 9-12 TABS/DAY     . amLODipine (NORVASC) 2.5 MG tablet Take 1 tablet (2.5 mg total) by mouth daily. 90 tablet 1  . aspirin EC 81 MG tablet Take 81 mg by mouth daily.      Marland Kitchen b complex vitamins tablet Take 1 tablet by mouth daily.    . carvedilol (COREG) 25 MG tablet Take 1 tablet (25 mg total) by mouth 2 (two) times daily with a meal. 180 tablet 1  . cholecalciferol (VITAMIN D) 1000 UNITS tablet Take 1,000 Units by mouth daily.     . cloNIDine (CATAPRES) 0.1 MG tablet Take 1 tablet (0.1 mg total) by mouth 2 (two) times daily. 180 tablet 3  . Flaxseed, Linseed, (FLAX SEED OIL PO) Take 1,000 mg by mouth 2 (two) times daily.    . NON FORMULARY Take 2 tablets by mouth daily. Prostate Ess plus    . NON FORMULARY Take 4 tablets by mouth daily. osteomatrix     . pravastatin (PRAVACHOL) 20 MG tablet Take 1 tablet (20 mg total) by mouth daily. 90 tablet 3  . vitamin C (ASCORBIC ACID) 500 MG tablet Take 500 mg by mouth as needed.  No current facility-administered medications for this visit.    Allergies  Allergen Reactions  . Ace Inhibitors Other (See Comments)    REACTION: Angioedema  . Aspirin Other (See Comments)    REACTION: Stomach Discomfort/TAKES 81 MG BUT CANT TAKE 325 MG ASA PER PT.  . Toprol Xl [Metoprolol Succinate] Other (See Comments)    Angioedema    Review of Systems negative except from HPI and PMH  Physical Exam BP 136/78 mmHg  Pulse 64  Ht 5' 9.5" (1.765 m)  Wt 173 lb (78.472 kg)  BMI 25.19 kg/m2 Well developed and well nourished in no acute distress HENT normal E scleral and icterus clear Neck Supple JVP flat; carotids brisk and full Clear Device pocket well healed; without hematoma or erythema.  There is no tethering   Regular rate and rhythm, no murmurs gallops or rub Soft with active bowel sounds No clubbing cyanosis none Edema Alert and  oriented, grossly normal motor and sensory function Skin Warm and Dry  ECG  Atrial pacing with intrinsic conduction Assessment and  Plan  Sinus node dysfunction  Pacemaker The patient's device was interrogated.  The information was reviewed. No changes were made in the programming.    Hypertension  Orthostatic intolerance   BP reasonable given hx of orthostasis but he is on an unusual regime with low dose amlodipine and clonidine, and i wonder if these could be consolidated  He is to see his PCP in a few months and he would like to defer any of these changes to him

## 2015-03-08 NOTE — Patient Instructions (Addendum)
Medication Instructions: - no changes  Labwork: -none  Procedures/Testing: - none  Follow-Up: - Your physician wants you to follow-up in: 6 months with [facility] & 1 year with Dr. Caryl Comes. You will receive a reminder letter in the mail two months in advance. If you don't receive a letter, please call our office to schedule the follow-up appointment.   Any Additional Special Instructions Will Be Listed Below (If Applicable).

## 2015-03-09 LAB — CUP PACEART INCLINIC DEVICE CHECK
Brady Statistic RA Percent Paced: 30 %
Date Time Interrogation Session: 20161213050000
Implantable Lead Implant Date: 20121227
Implantable Lead Location: 753859
Implantable Lead Model: 4469
Implantable Lead Serial Number: 502103
Implantable Lead Serial Number: 552434
Lead Channel Pacing Threshold Amplitude: 0.8 V
Lead Channel Pacing Threshold Amplitude: 1.3 V
Lead Channel Pacing Threshold Pulse Width: 0.4 ms
Lead Channel Sensing Intrinsic Amplitude: 24 mV
Lead Channel Setting Pacing Amplitude: 2 V
Lead Channel Setting Pacing Amplitude: 2.4 V
Lead Channel Setting Pacing Pulse Width: 0.4 ms
Lead Channel Setting Sensing Sensitivity: 2.5 mV
MDC IDC LEAD IMPLANT DT: 20121227
MDC IDC LEAD LOCATION: 753859
MDC IDC MSMT LEADCHNL RA IMPEDANCE VALUE: 459 Ohm
MDC IDC MSMT LEADCHNL RA PACING THRESHOLD PULSEWIDTH: 0.4 ms
MDC IDC MSMT LEADCHNL RA SENSING INTR AMPL: 8.2 mV
MDC IDC MSMT LEADCHNL RV IMPEDANCE VALUE: 527 Ohm
MDC IDC STAT BRADY RV PERCENT PACED: 8 %
Pulse Gen Serial Number: 120835

## 2015-03-14 ENCOUNTER — Ambulatory Visit (INDEPENDENT_AMBULATORY_CARE_PROVIDER_SITE_OTHER): Payer: Medicare Other

## 2015-03-14 DIAGNOSIS — Z23 Encounter for immunization: Secondary | ICD-10-CM | POA: Diagnosis not present

## 2015-05-16 ENCOUNTER — Ambulatory Visit (INDEPENDENT_AMBULATORY_CARE_PROVIDER_SITE_OTHER): Payer: Medicare Other | Admitting: Family Medicine

## 2015-05-16 ENCOUNTER — Encounter: Payer: Self-pay | Admitting: Family Medicine

## 2015-05-16 VITALS — BP 153/77 | HR 64 | Temp 97.6°F | Resp 16 | Ht 69.5 in | Wt 166.5 lb

## 2015-05-16 DIAGNOSIS — I495 Sick sinus syndrome: Secondary | ICD-10-CM

## 2015-05-16 DIAGNOSIS — I10 Essential (primary) hypertension: Secondary | ICD-10-CM

## 2015-05-16 DIAGNOSIS — Z8546 Personal history of malignant neoplasm of prostate: Secondary | ICD-10-CM

## 2015-05-16 DIAGNOSIS — E785 Hyperlipidemia, unspecified: Secondary | ICD-10-CM | POA: Diagnosis not present

## 2015-05-16 LAB — COMPREHENSIVE METABOLIC PANEL
ALK PHOS: 68 U/L (ref 39–117)
ALT: 15 U/L (ref 0–53)
AST: 19 U/L (ref 0–37)
Albumin: 4.5 g/dL (ref 3.5–5.2)
BUN: 16 mg/dL (ref 6–23)
CHLORIDE: 104 meq/L (ref 96–112)
CO2: 29 meq/L (ref 19–32)
Calcium: 9.7 mg/dL (ref 8.4–10.5)
Creatinine, Ser: 0.86 mg/dL (ref 0.40–1.50)
GFR: 91.86 mL/min (ref >60.00)
GLUCOSE: 106 mg/dL — AB (ref 70–99)
POTASSIUM: 4.2 meq/L (ref 3.5–5.1)
Sodium: 140 mEq/L (ref 135–145)
Total Bilirubin: 0.8 mg/dL (ref 0.2–1.2)
Total Protein: 7.2 g/dL (ref 6.0–8.3)

## 2015-05-16 LAB — LIPID PANEL
CHOL/HDL RATIO: 3
Cholesterol: 135 mg/dL (ref 0–200)
HDL: 49.2 mg/dL (ref >39.00)
LDL Cholesterol: 66 mg/dL (ref 0–99)
NONHDL: 85.62
Triglycerides: 97 mg/dL (ref 0.0–149.0)
VLDL: 19.4 mg/dL (ref 0.0–40.0)

## 2015-05-16 NOTE — Progress Notes (Signed)
OFFICE VISIT  05/16/2015   CC:  Chief Complaint  Patient presents with  . Follow-up    Pt is not fasting.   HPI:    Patient is a 75 y.o. Caucasian male who presents for 6 mo f/u HTN, hyperlipidemia, and hx of sinus arrest of unclear etiology--s/p ICD placement by Dr. Caryl Comes for this.    Compliant with bp and chol meds. Home bp monitoring: all normal. His diet is A LOT better than it had been, also walking daily.   He is about due for a 6 mo routine prostate ca surveillance f/u with Dr. Orland Mustard.  He says he'll be moving to [place] to be closer to family--prob around May this year.  ROS: no palpitations, no dizziness, no CP, no presyncope/syncope, no LE swelling, no HAs, no focal weakness, no vision complaints.  Past Medical History  Diagnosis Date  . Prostate cancer [facility]) 2007    Dr. Harlow Asa ([facility] in [facility])  Pt is s/p brachytherapy 2011.  Jan 2015 PSA 0.6 ng/ml.  . Diverticulosis   . Hypertension   . Syncope     ? Malignant vasovagal syncope-Pacer placed  . Allergic angioedema     ACE inhibitor/beta blocker  . Sinus arrest   . Influenza A 02/2011  . Intermittent vertigo   . Headache(784.0)   . Gastroenteritis 2012  . Adenomatous colon polyp 06/05/11    Repeat 2018 per Dr. Carlean Purl  . Microhematuria 01/06/2012    Dr. Orland Mustard, urologist in [place], did cysto and bladder bx 06/24/12 and it was unremarkable  . Hyperlipidemia     Past Surgical History  Procedure Laterality Date  . Cataract extraction  1984, 1994    (lens implants bilat)  . Tonsillectomy  1950  . Radioactive seed implant      prostate 07/2009.  PSA dropped to below 2 after this (Dr. Harlow Asa at [facility] in [facility])  . Pacemaker insertion  02/2011    [employer]. [id number] Dual chamber; place in [place], [place] when pt visiting there for holiday 02/2011.  . Transthoracic echocardiogram  02/2011    Normal  . Colonoscopy  06/05/11    60mm sigmoid polyp removed (+adenomatous), moderate  diverticulosis, internal hemorrhoids.  Repeat 2018.  Marland Kitchen Cystostomy w/ bladder biopsy  06/24/12    Benign    Outpatient Prescriptions Prior to Visit  Medication Sig Dispense Refill  . acetaminophen (TYLENOL) 325 MG tablet Take 500 mg by mouth as needed.     . Alfalfa 500 MG TABS 9-12 TABS/DAY     . amLODipine (NORVASC) 2.5 MG tablet Take 1 tablet (2.5 mg total) by mouth daily. 90 tablet 1  . aspirin EC 81 MG tablet Take 81 mg by mouth daily.      Marland Kitchen b complex vitamins tablet Take 1 tablet by mouth daily.    . carvedilol (COREG) 25 MG tablet Take 1 tablet (25 mg total) by mouth 2 (two) times daily with a meal. 180 tablet 1  . cholecalciferol (VITAMIN D) 1000 UNITS tablet Take 1,000 Units by mouth daily.     . cloNIDine (CATAPRES) 0.1 MG tablet Take 1 tablet (0.1 mg total) by mouth 2 (two) times daily. 180 tablet 3  . Flaxseed, Linseed, (FLAX SEED OIL PO) Take 1,000 mg by mouth 2 (two) times daily.    . NON FORMULARY Take 2 tablets by mouth daily. Prostate Ess plus    . NON FORMULARY Take 4 tablets by mouth daily. osteomatrix     .  pravastatin (PRAVACHOL) 20 MG tablet Take 1 tablet (20 mg total) by mouth daily. 90 tablet 3  . vitamin C (ASCORBIC ACID) 500 MG tablet Take 500 mg by mouth as needed.      No facility-administered medications prior to visit.    Allergies  Allergen Reactions  . Ace Inhibitors Other (See Comments)    REACTION: Angioedema  . Aspirin Other (See Comments)    REACTION: Stomach Discomfort/TAKES 81 MG BUT CANT TAKE 325 MG ASA PER PT.  . Toprol Xl [Metoprolol Succinate] Other (See Comments)    Angioedema    ROS As per HPI  PE: Blood pressure 153/77, pulse 64, temperature 97.6 F (36.4 C), temperature source Oral, resp. rate 16, height 5' 9.5" (1.765 m), weight 166 lb 8 oz (75.524 kg), SpO2 96 %. Gen: Alert, well appearing.  Patient is oriented to person, place, time, and situation. CV: RRR, no m/r/g.   LUNGS: CTA bilat, nonlabored resps, good aeration in all  lung fields. EXT: no clubbing, cyanosis, or edema.   LABS:  Lab Results  Component Value Date   TSH 2.40 05/18/2014   Lab Results  Component Value Date   WBC 7.7 05/18/2014   HGB 14.7 05/18/2014   HCT 43.2 05/18/2014   MCV 87.2 05/18/2014   PLT 304.0 05/18/2014   Lab Results  Component Value Date   CREATININE 1.03 05/18/2014   BUN 13 05/18/2014   NA 138 05/18/2014   K 4.2 05/18/2014   CL 103 05/18/2014   CO2 31 05/18/2014   Lab Results  Component Value Date   ALT 19 05/18/2014   AST 22 05/18/2014   ALKPHOS 86 05/18/2014   BILITOT 0.8 05/18/2014   Lab Results  Component Value Date   CHOL 125 05/18/2014   Lab Results  Component Value Date   HDL 45.10 05/18/2014   Lab Results  Component Value Date   LDLCALC 59 05/18/2014   Lab Results  Component Value Date   TRIG 103.0 05/18/2014   Lab Results  Component Value Date   CHOLHDL 3 05/18/2014   IMPRESSION AND PLAN:  1) HTN; The current medical regimen is effective;  continue present plan and medications. Continue home monitoring. Lytes/cr today.  2) Hyperlipidemia: tolerating statin.  Check FLP and AST/ALT today. Last lipids 1 yr ago were excellent.  3) Hx of sinus arrest, has ICD and is asymptomatic from a cardiac standpoint. He'll continue to have regular f/u with his electrophysiologist, Dr. Caryl Comes.  4) Hx of prostate cancer: keep appropriate surveillance f/u with Dr. Orland Mustard.  Unfortunately, he'll be moving to [place] in a few months so we'll see him back prn.  FOLLOW UP: Return for no f/u appt needed: pt plans on moving out of the area around 07/2015.

## 2015-05-16 NOTE — Progress Notes (Signed)
Pre visit review using our clinic review tool, if applicable. No additional management support is needed unless otherwise documented below in the visit note. 

## 2015-05-31 ENCOUNTER — Telehealth: Payer: Self-pay | Admitting: Family Medicine

## 2015-05-31 MED ORDER — CARVEDILOL 25 MG PO TABS: 25.0000 mg | ORAL_TABLET | Freq: Two times a day (BID) | ORAL | Status: DC

## 2015-05-31 MED ORDER — AMLODIPINE BESYLATE 2.5 MG PO TABS: 2.5000 mg | ORAL_TABLET | Freq: Every day | ORAL | Status: DC

## 2015-05-31 NOTE — Telephone Encounter (Signed)
Pt asking for refill on amlodipine and carvedilol, pt states that he needs a 90 day supply and needs this before Thurs. [facility] [address] in [place].

## 2015-05-31 NOTE — Telephone Encounter (Signed)
Amlodipine and coreg refilled per refill protocol.

## 2015-06-02 NOTE — Telephone Encounter (Signed)
Phone note opened in error.  

## 2015-07-21 ENCOUNTER — Other Ambulatory Visit: Payer: Self-pay | Admitting: Family Medicine

## 2015-07-21 NOTE — Telephone Encounter (Signed)
RF request for clonidine LOV: 05/16/15 Next ov: None Last written: 06/28/14 #180 w/ 3Rf

## 2015-09-05 ENCOUNTER — Ambulatory Visit (INDEPENDENT_AMBULATORY_CARE_PROVIDER_SITE_OTHER): Payer: Medicare Other | Admitting: *Deleted

## 2015-09-05 ENCOUNTER — Encounter: Payer: Self-pay | Admitting: Internal Medicine

## 2015-09-05 DIAGNOSIS — Z950 Presence of cardiac pacemaker: Secondary | ICD-10-CM | POA: Diagnosis not present

## 2015-09-05 LAB — CUP PACEART INCLINIC DEVICE CHECK
Brady Statistic RA Percent Paced: 27 %
Implantable Lead Implant Date: 20121227
Implantable Lead Implant Date: 20121227
Implantable Lead Location: 753859
Implantable Lead Model: 4469
Implantable Lead Serial Number: 552434
Lead Channel Pacing Threshold Amplitude: 1.3 V
Lead Channel Pacing Threshold Pulse Width: 0.4 ms
Lead Channel Pacing Threshold Pulse Width: 0.4 ms
Lead Channel Sensing Intrinsic Amplitude: 18.9 mV
Lead Channel Setting Pacing Amplitude: 2 V
Lead Channel Setting Pacing Amplitude: 2.4 V
MDC IDC LEAD LOCATION: 753859
MDC IDC LEAD SERIAL: 502103
MDC IDC MSMT LEADCHNL RA IMPEDANCE VALUE: 451 Ohm
MDC IDC MSMT LEADCHNL RA PACING THRESHOLD AMPLITUDE: 0.8 V
MDC IDC MSMT LEADCHNL RA SENSING INTR AMPL: 6.9 mV
MDC IDC MSMT LEADCHNL RV IMPEDANCE VALUE: 503 Ohm
MDC IDC PG SERIAL: 120835
MDC IDC SESS DTM: 20170612040000
MDC IDC SET LEADCHNL RV PACING PULSEWIDTH: 0.4 ms
MDC IDC SET LEADCHNL RV SENSING SENSITIVITY: 2.5 mV
MDC IDC STAT BRADY RV PERCENT PACED: 8 %

## 2015-09-05 NOTE — Progress Notes (Signed)
Pacemaker check in clinic. Normal device function. Thresholds, sensing, impedances consistent with previous measurements. Device programmed to maximize longevity. 2 mode switches (<1%), AFl, longest ~6sec. 1 high ventricular rate noted--1:1 SVT. Device programmed at appropriate safety margins. Histogram distribution appropriate for patient activity level. Device programmed to optimize intrinsic conduction. Estimated longevity 6.5 years. Patient education completed. ROV with SK in 02/2016.

## 2015-09-22 ENCOUNTER — Other Ambulatory Visit: Payer: Self-pay | Admitting: *Deleted

## 2015-09-22 MED ORDER — PRAVASTATIN SODIUM 20 MG PO TABS: 20.0000 mg | ORAL_TABLET | Freq: Every day | ORAL | Status: DC

## 2015-09-22 NOTE — Telephone Encounter (Signed)
RF request for pravachol LOV: 05/16/15 Next ov: None Last written: 08/31/14 #90 w/ 3RF

## 2015-11-04 ENCOUNTER — Other Ambulatory Visit: Payer: Self-pay | Admitting: Family Medicine

## 2016-01-02 ENCOUNTER — Encounter: Payer: Self-pay | Admitting: Family Medicine

## 2016-01-02 ENCOUNTER — Ambulatory Visit (INDEPENDENT_AMBULATORY_CARE_PROVIDER_SITE_OTHER): Payer: Medicare Other | Admitting: Family Medicine

## 2016-01-02 VITALS — BP 167/78 | HR 63 | Temp 97.6°F | Resp 16 | Wt 167.0 lb

## 2016-01-02 DIAGNOSIS — E7800 Pure hypercholesterolemia, unspecified: Secondary | ICD-10-CM

## 2016-01-02 DIAGNOSIS — I10 Essential (primary) hypertension: Secondary | ICD-10-CM

## 2016-01-02 DIAGNOSIS — Z95810 Presence of automatic (implantable) cardiac defibrillator: Secondary | ICD-10-CM | POA: Diagnosis not present

## 2016-01-02 MED ORDER — CLONIDINE HCL 0.1 MG PO TABS: 0.1000 mg | ORAL_TABLET | Freq: Two times a day (BID) | ORAL | 1 refills | Status: DC

## 2016-01-02 MED ORDER — CARVEDILOL 25 MG PO TABS: 25.0000 mg | ORAL_TABLET | Freq: Two times a day (BID) | ORAL | 1 refills | Status: DC

## 2016-01-02 MED ORDER — AMLODIPINE BESYLATE 2.5 MG PO TABS: 2.5000 mg | ORAL_TABLET | Freq: Every day | ORAL | 1 refills | Status: DC

## 2016-01-02 NOTE — Progress Notes (Signed)
Pre visit review using our clinic review tool, if applicable. No additional management support is needed unless otherwise documented below in the visit note. 

## 2016-01-02 NOTE — Progress Notes (Signed)
OFFICE VISIT  01/02/2016   CC:  Chief Complaint  Patient presents with  . Follow-up    Refill on medication     HPI:    Patient is a 75 y.o. Caucasian male who presents for f/u HTN, hyperlipidemia, and hx of sinus arrest of unclear etiology--s/p ICD placement by Dr. Caryl Comes for this. Still hasn't found a place to buy in [place] yet, currently renting.  Nees RF on coreg, clonidine, and amlodipine.  Home bp monitoring shows 130s over 70s. Has been feeling well.  Taking statin daily, no problems.  No presyncope, palpitations, dizziness, or syncope.  No CP or SOB.  He is retired and not bored.     Past Medical History:  Diagnosis Date  . Adenomatous colon polyp 06/05/11   Repeat 2018 per Dr. Carlean Purl  . Allergic angioedema    ACE inhibitor/beta blocker  . Diverticulosis   . Gastroenteritis 2012  . Headache(784.0)   . Hyperlipidemia   . Hypertension   . Influenza A 02/2011  . Intermittent vertigo   . Microhematuria 01/06/2012   Dr. Orland Mustard, urologist in [place], did cysto and bladder bx 06/24/12 and it was unremarkable  . Prostate cancer [facility]) 2007   Dr. Harlow Asa ([facility] in [facility])  Pt is s/p brachytherapy 2011.  Jan 2015 PSA 0.6 ng/ml.  . Sinus arrest   . Syncope    ? Malignant vasovagal syncope-Pacer placed    Past Surgical History:  Procedure Laterality Date  . CATARACT EXTRACTION  1984, 1994   (lens implants bilat)  . COLONOSCOPY  06/05/11   19mm sigmoid polyp removed (+adenomatous), moderate diverticulosis, internal hemorrhoids.  Repeat 2018.  Marland Kitchen CYSTOSTOMY W/ BLADDER BIOPSY  06/24/12   Benign  . PACEMAKER INSERTION  02/2011   [employer]. [id number] Dual chamber; place in [place], [place] when pt visiting there for holiday 02/2011.  Marland Kitchen RADIOACTIVE SEED IMPLANT     prostate 07/2009.  PSA dropped to below 2 after this (Dr. Harlow Asa at [facility] in [facility])  . TONSILLECTOMY  1950  . TRANSTHORACIC ECHOCARDIOGRAM  02/2011   Normal    Outpatient Medications Prior  to Visit  Medication Sig Dispense Refill  . acetaminophen (TYLENOL) 325 MG tablet Take 500 mg by mouth as needed.     . Alfalfa 500 MG TABS 9-12 TABS/DAY     . aspirin EC 81 MG tablet Take 81 mg by mouth daily.      Marland Kitchen b complex vitamins tablet Take 1 tablet by mouth daily.    . cholecalciferol (VITAMIN D) 1000 UNITS tablet Take 1,000 Units by mouth daily.     . Flaxseed, Linseed, (FLAX SEED OIL PO) Take 1,000 mg by mouth 2 (two) times daily.    . NON FORMULARY Take 2 tablets by mouth daily. Prostate Ess plus    . NON FORMULARY Take 4 tablets by mouth daily. osteomatrix     . pravastatin (PRAVACHOL) 20 MG tablet Take 1 tablet (20 mg total) by mouth daily. 90 tablet 3  . vitamin C (ASCORBIC ACID) 500 MG tablet Take 500 mg by mouth as needed.     Marland Kitchen amLODipine (NORVASC) 2.5 MG tablet TAKE ONE TABLET BY MOUTH ONCE DAILY 90 tablet 1  . carvedilol (COREG) 25 MG tablet TAKE ONE TABLET BY MOUTH TWICE DAILY WITH MEALS 180 tablet 1  . cloNIDine (CATAPRES) 0.1 MG tablet TAKE ONE TABLET BY MOUTH TWICE DAILY 180 tablet 1   No facility-administered medications prior to visit.  Allergies  Allergen Reactions  . Ace Inhibitors Other (See Comments)    REACTION: Angioedema  . Aspirin Other (See Comments)    REACTION: Stomach Discomfort/TAKES 81 MG BUT CANT TAKE 325 MG ASA PER PT.  . Toprol Xl [Metoprolol Succinate] Other (See Comments)    Angioedema    ROS As per HPI  PE: Blood pressure (!) 167/78, pulse 63, temperature 97.6 F (36.4 C), temperature source Oral, resp. rate 16, weight 167 lb (75.8 kg), SpO2 96 %. Repeat bp manually: 126/72. Gen: Alert, well appearing.  Patient is oriented to person, place, time, and situation. CV: RRR, no m/r/g.   LUNGS: CTA bilat, nonlabored resps, good aeration in all lung fields. EXT: no clubbing, cyanosis, or edema.    LABS:  Lab Results  Component Value Date   TSH 2.40 05/18/2014   Lab Results  Component Value Date   WBC 7.7 05/18/2014   HGB 14.7  05/18/2014   HCT 43.2 05/18/2014   MCV 87.2 05/18/2014   PLT 304.0 05/18/2014   Lab Results  Component Value Date   CREATININE 0.86 05/16/2015   BUN 16 05/16/2015   NA 140 05/16/2015   K 4.2 05/16/2015   CL 104 05/16/2015   CO2 29 05/16/2015   Lab Results  Component Value Date   ALT 15 05/16/2015   AST 19 05/16/2015   ALKPHOS 68 05/16/2015   BILITOT 0.8 05/16/2015   Lab Results  Component Value Date   CHOL 135 05/16/2015   Lab Results  Component Value Date   HDL 49.20 05/16/2015   Lab Results  Component Value Date   LDLCALC 66 05/16/2015   Lab Results  Component Value Date   TRIG 97.0 05/16/2015   Lab Results  Component Value Date   CHOLHDL 3 05/16/2015    IMPRESSION AND PLAN:  1) Essential HTN: The current medical regimen is effective;  continue present plan and medications.  2) Hyperlipidemia: tolerating statin.  Chol levels and AST/ALT good 04/2015.  3) Hx of sinus arrest: has ICD and is doing well.  He is still considering relocation to [place] to be near his children/grandchildren.  He deferred flu vaccine today, wants to get this when he gets back to [place].  An After Visit Summary was printed and given to the patient.  FOLLOW UP: Return for no appt needed--pt likely relocating to [facility]  Signed:  Crissie Sickles, MD           01/02/2016

## 2016-03-14 DIAGNOSIS — R69 Illness, unspecified: Secondary | ICD-10-CM | POA: Diagnosis not present

## 2016-03-14 NOTE — Progress Notes (Signed)
Patient Care Team: Tammi Sou, MD as PCP - General (Family Medicine) Milana Na, MD as Consulting Physician (Urology) Deboraha Sprang, MD (Cardiology) and   HPI  Billy Burns is a 75 y.o. 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  Because of the likelihood that there was a GI trigger given the associated symptoms, he was to be seen by GI. No specific finding was noted.   No intercurrent syncope  The patient denies chest pain, shortness of breath, nocturnal dyspnea, orthopnea or peripheral edema.  There have been no palpitations, lightheadedness or syncope.    .        Past Medical History:  Diagnosis Date  . Adenomatous colon polyp 06/05/11   Repeat 2018 per Dr. Carlean Purl  . Allergic angioedema    ACE inhibitor/beta blocker  . Diverticulosis   . Gastroenteritis 2012  . Headache(784.0)   . Hyperlipidemia   . Hypertension   . Influenza A 02/2011  . Intermittent vertigo   . Microhematuria 01/06/2012   Dr. Orland Mustard, urologist in [place], did cysto and bladder bx 06/24/12 and it was unremarkable  . Prostate cancer [facility]) 2007   Dr. Harlow Asa ([facility] in [facility])  Pt is s/p brachytherapy 2011.  Jan 2015 PSA 0.6 ng/ml.  . Sinus arrest   . Syncope    ? Malignant vasovagal syncope-Pacer placed    Past Surgical History:  Procedure Laterality Date  . CATARACT EXTRACTION  1984, 1994   (lens implants bilat)  . COLONOSCOPY  06/05/11   24mm sigmoid polyp removed (+adenomatous), moderate diverticulosis, internal hemorrhoids.  Repeat 2018.  Marland Kitchen CYSTOSTOMY W/ BLADDER BIOPSY  06/24/12   Benign  . PACEMAKER INSERTION  02/2011   [employer]. [id number] Dual chamber; place in [place], [place] when pt visiting there for holiday 02/2011.  Marland Kitchen RADIOACTIVE SEED IMPLANT     prostate 07/2009.  PSA dropped to below 2 after this (Dr. Harlow Asa at [facility] in [facility])  . TONSILLECTOMY  1950  .  TRANSTHORACIC ECHOCARDIOGRAM  02/2011   Normal    Current Outpatient Prescriptions  Medication Sig Dispense Refill  . acetaminophen (TYLENOL) 325 MG tablet Take 500 mg by mouth as needed.     . Alfalfa 500 MG TABS 9-12 TABS/DAY     . amLODipine (NORVASC) 2.5 MG tablet Take 1 tablet (2.5 mg total) by mouth daily. 90 tablet 1  . aspirin EC 81 MG tablet Take 81 mg by mouth daily.      Marland Kitchen b complex vitamins tablet Take 1 tablet by mouth daily.    . carvedilol (COREG) 25 MG tablet Take 1 tablet (25 mg total) by mouth 2 (two) times daily with a meal. 180 tablet 1  . cholecalciferol (VITAMIN D) 1000 UNITS tablet Take 1,000 Units by mouth daily.     . cloNIDine (CATAPRES) 0.1 MG tablet Take 1 tablet (0.1 mg total) by mouth 2 (two) times daily. 180 tablet 1  . Flaxseed, Linseed, (FLAX SEED OIL PO) Take 1,000 mg by mouth 2 (two) times daily.    . NON FORMULARY Take 2 tablets by mouth daily. Prostate Ess plus    . NON FORMULARY Take 4 tablets by mouth daily. osteomatrix     . pravastatin (PRAVACHOL) 20 MG tablet Take 1 tablet (20 mg total) by mouth daily. 90 tablet 3  . vitamin C (ASCORBIC ACID) 500 MG tablet Take 500 mg by mouth  as needed.      No current facility-administered medications for this visit.     Allergies  Allergen Reactions  . Ace Inhibitors Other (See Comments)    REACTION: Angioedema  . Aspirin Other (See Comments)    REACTION: Stomach Discomfort/TAKES 81 MG BUT CANT TAKE 325 MG ASA PER PT.  . Toprol Xl [Metoprolol Succinate] Other (See Comments)    Angioedema    Review of Systems negative except from HPI and PMH  Physical Exam BP 132/64   Pulse 76   Ht 5\' 9"  (1.753 m)   Wt 171 lb 6.4 oz (77.7 kg)   SpO2 96%   BMI 25.31 kg/m  Well developed and well nourished in no acute distress HENT normal E scleral and icterus clear Neck Supple JVP flat; carotids brisk and full Clear Device pocket well healed; without hematoma or erythema.  There is no tethering   Regular  rate and rhythm, no murmurs gallops or rub Soft with active bowel sounds No clubbing cyanosis none Edema Alert and oriented, grossly normal motor and sensory function Skin Warm and Dry  ECG  Sinus 71 21/10/38 Assessment and  Plan  Sinus node dysfunction  Pacemaker The patient's device was interrogated.  The information was reviewed. No changes were made in the programming.    Hypertension  Orthostatic intolerance   BP reasonable given hx of orthostasis   No syncope

## 2016-03-15 ENCOUNTER — Encounter: Payer: Self-pay | Admitting: Internal Medicine

## 2016-03-15 ENCOUNTER — Ambulatory Visit (INDEPENDENT_AMBULATORY_CARE_PROVIDER_SITE_OTHER): Payer: Medicare Other | Admitting: Internal Medicine

## 2016-03-15 VITALS — BP 132/64 | HR 76 | Ht 69.0 in | Wt 171.4 lb

## 2016-03-15 DIAGNOSIS — Z950 Presence of cardiac pacemaker: Secondary | ICD-10-CM

## 2016-03-15 DIAGNOSIS — I495 Sick sinus syndrome: Secondary | ICD-10-CM | POA: Diagnosis not present

## 2016-03-15 DIAGNOSIS — I10 Essential (primary) hypertension: Secondary | ICD-10-CM

## 2016-03-15 NOTE — Patient Instructions (Addendum)
Medication Instructions: - Your physician recommends that you continue on your current medications as directed. Please refer to the Current Medication list given to you today.  Labwork: - none ordered  Procedures/Testing: - none ordered  Follow-Up: - Your physician wants you to follow-up in: 6 months with the Device [facility] & 1 year with Dr. Klein. You will receive a reminder letter in the mail two months in advance. If you don't receive a letter, please call our office to schedule the follow-up appointment.   Any Additional Special Instructions Will Be Listed Below (If Applicable).     If you need a refill on your cardiac medications before your next appointment, please call your pharmacy.   

## 2016-03-16 ENCOUNTER — Encounter: Payer: Self-pay | Admitting: Family Medicine

## 2016-03-20 LAB — CUP PACEART INCLINIC DEVICE CHECK
Brady Statistic RA Percent Paced: 27 %
Date Time Interrogation Session: 20171221050000
Implantable Lead Implant Date: 20121227
Implantable Lead Implant Date: 20121227
Implantable Lead Location: 753859
Implantable Lead Model: 4469
Implantable Pulse Generator Implant Date: 20121227
Lead Channel Pacing Threshold Amplitude: 1.1 V
Lead Channel Pacing Threshold Pulse Width: 0.4 ms
Lead Channel Sensing Intrinsic Amplitude: 23.1 mV
Lead Channel Setting Pacing Amplitude: 2.4 V
Lead Channel Setting Sensing Sensitivity: 2.5 mV
MDC IDC LEAD LOCATION: 753859
MDC IDC LEAD SERIAL: 502103
MDC IDC LEAD SERIAL: 552434
MDC IDC MSMT LEADCHNL RA IMPEDANCE VALUE: 454 Ohm
MDC IDC MSMT LEADCHNL RA PACING THRESHOLD AMPLITUDE: 0.7 V
MDC IDC MSMT LEADCHNL RA SENSING INTR AMPL: 7.8 mV
MDC IDC MSMT LEADCHNL RV IMPEDANCE VALUE: 486 Ohm
MDC IDC MSMT LEADCHNL RV PACING THRESHOLD PULSEWIDTH: 0.4 ms
MDC IDC PG SERIAL: 120835
MDC IDC SET LEADCHNL RA PACING AMPLITUDE: 2 V
MDC IDC SET LEADCHNL RV PACING PULSEWIDTH: 0.4 ms
MDC IDC STAT BRADY RV PERCENT PACED: 8 %

## 2016-05-10 ENCOUNTER — Encounter: Payer: Self-pay | Admitting: Internal Medicine

## 2016-05-18 ENCOUNTER — Telehealth: Payer: Self-pay | Admitting: Emergency Medicine

## 2016-05-18 ENCOUNTER — Telehealth: Payer: Self-pay | Admitting: Family Medicine

## 2016-05-18 NOTE — Telephone Encounter (Signed)
OK 

## 2016-05-18 NOTE — Telephone Encounter (Signed)
Patient called stating he was returning a call regarding the flu shot. Pt would like a call back.

## 2016-05-18 NOTE — Telephone Encounter (Signed)
Patient would like to transfer from Dr. Tammi Sou, MD to Dr. Nani Ravens due to [facility] location being to far, please advise

## 2016-05-18 NOTE — Telephone Encounter (Signed)
OK with me.

## 2016-05-21 NOTE — Telephone Encounter (Signed)
Left detailed message on pts phone, asking if he has had, would like to get or would like to decline the flu shot.

## 2016-05-21 NOTE — Telephone Encounter (Signed)
Patient scheduled new patient appointment for 05/24/16

## 2016-05-22 ENCOUNTER — Telehealth: Payer: Self-pay | Admitting: *Deleted

## 2016-05-22 NOTE — Telephone Encounter (Signed)
Pt states he will schedule appt when he is in the office on Thursday.

## 2016-05-24 ENCOUNTER — Encounter: Payer: Self-pay | Admitting: Family Medicine

## 2016-05-24 ENCOUNTER — Ambulatory Visit (INDEPENDENT_AMBULATORY_CARE_PROVIDER_SITE_OTHER): Payer: Medicare HMO | Admitting: Family Medicine

## 2016-05-24 VITALS — BP 151/77 | HR 64 | Temp 97.7°F | Ht 69.0 in | Wt 173.0 lb

## 2016-05-24 DIAGNOSIS — Z87898 Personal history of other specified conditions: Secondary | ICD-10-CM | POA: Diagnosis not present

## 2016-05-24 DIAGNOSIS — Z0000 Encounter for general adult medical examination without abnormal findings: Secondary | ICD-10-CM

## 2016-05-24 DIAGNOSIS — E785 Hyperlipidemia, unspecified: Secondary | ICD-10-CM | POA: Diagnosis not present

## 2016-05-24 DIAGNOSIS — I10 Essential (primary) hypertension: Secondary | ICD-10-CM

## 2016-05-24 LAB — COMPREHENSIVE METABOLIC PANEL
ALBUMIN: 4.4 g/dL (ref 3.5–5.2)
ALK PHOS: 68 U/L (ref 39–117)
ALT: 17 U/L (ref 0–53)
AST: 23 U/L (ref 0–37)
BUN: 16 mg/dL (ref 6–23)
CALCIUM: 9.3 mg/dL (ref 8.4–10.5)
CO2: 31 mEq/L (ref 19–32)
Chloride: 104 mEq/L (ref 96–112)
Creatinine, Ser: 1.06 mg/dL (ref 0.40–1.50)
GFR: 71.97 mL/min (ref >60.00)
Glucose, Bld: 100 mg/dL — ABNORMAL HIGH (ref 70–99)
Potassium: 4 mEq/L (ref 3.5–5.1)
Sodium: 138 mEq/L (ref 135–145)
Total Bilirubin: 0.8 mg/dL (ref 0.2–1.2)
Total Protein: 7.5 g/dL (ref 6.0–8.3)

## 2016-05-24 LAB — LIPID PANEL
Cholesterol: 116 mg/dL (ref 0–200)
HDL: 47.3 mg/dL (ref >39.00)
LDL Cholesterol: 54 mg/dL (ref 0–99)
NONHDL: 68.69
TRIGLYCERIDES: 75 mg/dL (ref 0.0–149.0)
Total CHOL/HDL Ratio: 2
VLDL: 15 mg/dL (ref 0.0–40.0)

## 2016-05-24 LAB — HEMOGLOBIN A1C: HEMOGLOBIN A1C: 5.7 % (ref 4.6–6.5)

## 2016-05-24 NOTE — Progress Notes (Signed)
Pre visit review using our clinic review tool, if applicable. No additional management support is needed unless otherwise documented below in the visit note. 

## 2016-05-24 NOTE — Patient Instructions (Addendum)
Around 3 times per week, check your blood pressure 4 times per day. Twice in the morning and twice in the evening. The readings should be at least one minute apart. Write down these values and bring them to your next nurse visit/appointment.  When you check your BP, make sure you have been doing something calm/relaxing 5 minutes prior to checking. Both feet should be flat on the floor and you should be sitting. Use your left arm and make sure it is in a relaxed position (on a table), and that the cuff is at the approximate level/height of your heart.   We will let you know about your labs in the next 2-3 business days.  Research Cialis and Levitra and let us know if you would like to try any of these.

## 2016-05-24 NOTE — Progress Notes (Addendum)
Chief Complaint  Patient presents with  . Establish Care  . Annual Exam    Well Male Gant Gustason is here for a complete physical.   His last physical was >1 year ago.  Current diet: in general, a "healthy" diet   Current exercise: Walking daily Weight trend: stable Does pt snore? Yes. Daytime fatigue? No. Seat belt? Yes.     Health maintenance PNA 13 and 23 done in 2015 and 2009 respectively Td 09/2005  Flu- Refuses  The patient has a history of high blood pressure and was told by his cardiologist to discuss with his PCP about coming off of some blood pressure medication. He does check his blood pressure home and notes it ranges between low 100s to 150 over 60s and 70s. He is compliant with his medication. He is taking Coreg 25 mg twice daily, amlodipine 2.5 mg daily, and clonidine 0.1 mg twice daily. He denies any chest pain or shortness of breath.  He is also asking about coming off of his cholesterol medicine. He was placed on it because his LDL was >100. He has never had a heart attack or stroke. He would like to have his cholesterol checked first.   Lastly, he wanted to discuss erectile dysfunction. He realizes that this is a normal part of aging, but is still interested in having sex when able. He has heard terrible things about Viagra and would like my thoughts on this issue.    Past Medical History:  Diagnosis Date  . Adenomatous colon polyp 06/05/11   Repeat 2018 per Dr. Carlean Purl  . Allergic angioedema    ACE inhibitor/beta blocker  . Diverticulosis   . Gastroenteritis 2012  . Headache(784.0)   . Hyperlipidemia   . Hypertension   . Influenza A 02/2011  . Intermittent vertigo   . Microhematuria 01/06/2012   Dr. Orland Mustard, urologist in [place], did cysto and bladder bx 06/24/12 and it was unremarkable  . Prostate cancer [facility]) 2007   Dr. Harlow Asa ([facility])  Pt is s/p brachytherapy 2011.  Jan 2015 PSA 0.6 ng/ml.  . 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 Caryl Comes)  . Syncope    ? Malignant vasovagal syncope-Pacer placed    Past Surgical History:  Procedure Laterality Date  . CATARACT EXTRACTION  1984, 1994   (lens implants bilat)  . COLONOSCOPY  06/05/11   69mm sigmoid polyp removed (+adenomatous), moderate diverticulosis, internal hemorrhoids.  Repeat 2018.  Marland Kitchen CYSTOSTOMY W/ BLADDER BIOPSY  06/24/12   Benign  . PACEMAKER INSERTION  02/2011   [employer]. [id number] Dual chamber; place in [place], [place] when pt visiting there for holiday 02/2011.  Marland Kitchen RADIOACTIVE SEED IMPLANT     prostate 07/2009.  PSA dropped to below 2 after this (Dr. Harlow Asa at [facility] in [facility])  . TONSILLECTOMY  1950  . TRANSTHORACIC ECHOCARDIOGRAM  02/2011   Normal   Medications  Current Outpatient Prescriptions on File Prior to Visit  Medication Sig Dispense Refill  . acetaminophen (TYLENOL) 325 MG tablet Take 500 mg by mouth as needed.     . Alfalfa 500 MG TABS 9-12 TABS/DAY     . amLODipine (NORVASC) 2.5 MG tablet Take 1 tablet (2.5 mg total) by mouth daily. 90 tablet 1  . aspirin EC 81 MG tablet Take 81 mg by mouth daily.      Marland Kitchen b complex vitamins tablet Take 1 tablet by mouth daily.    . carvedilol (  COREG) 25 MG tablet Take 1 tablet (25 mg total) by mouth 2 (two) times daily with a meal. 180 tablet 1  . cholecalciferol (VITAMIN D) 1000 UNITS tablet Take 1,000 Units by mouth daily.     . cloNIDine (CATAPRES) 0.1 MG tablet Take 1 tablet (0.1 mg total) by mouth 2 (two) times daily. 180 tablet 1  . Flaxseed, Linseed, (FLAX SEED OIL PO) Take 1,000 mg by mouth 2 (two) times daily.    . NON FORMULARY Take 2 tablets by mouth daily. Prostate Ess plus    . NON FORMULARY Take 4 tablets by mouth daily. osteomatrix     . pravastatin (PRAVACHOL) 20 MG tablet Take 1 tablet (20 mg total) by mouth daily. 90 tablet 3  . vitamin C (ASCORBIC ACID) 500 MG tablet Take 500 mg by mouth as needed.      Allergies Allergies  Allergen Reactions  . Ace  Inhibitors Other (See Comments)    REACTION: Angioedema  . Aspirin Other (See Comments)    REACTION: Stomach Discomfort/TAKES 81 MG BUT CANT TAKE 325 MG ASA PER PT.  Marland Kitchen Toprol Xl [Metoprolol Succinate] Other (See Comments)    Angioedema   Family History Family History  Problem Relation Age of Onset  . Stroke Mother     during endarterectomy  . Colon cancer Father   . Diabetes Neg Hx     Review of Systems: Constitutional:  no unexpected change in weight, no fevers or chills Eye:  no recent significant change in vision Ear/Nose/Mouth/Throat:  Ears:  no tinnitus or hearing loss Nose/Mouth/Throat:  no complaints of nasal congestion or bleeding, no sore throat and oral sores Cardiovascular:  no chest pain, no palpitations Respiratory:  no cough and no shortness of breath Gastrointestinal:  no abdominal pain, no change in bowel habits, no nausea, vomiting, diarrhea, or constipation and no black or bloody stool GU:  Male: negative for dysuria, frequency, and incontinence and negative for prostate symptoms, +ED Musculoskeletal/Extremities:  no pain, redness, or swelling of the joints Integumentary (Skin/Breast):  no abnormal skin lesions reported Neurologic:  no headaches, no numbness, tingling Endocrine:  weight changes, masses in the neck, heat/cold intolerance, bowel or skin changes, or cardiovascular system symptoms Hematologic/Lymphatic:  no abnormal bleeding, no HIV risk factors, no night sweats, no swollen nodes, no weight loss  Exam BP (!) 151/77 (BP Location: Left Arm, Patient Position: Sitting, Cuff Size: Normal)   Pulse 64   Temp 97.7 F (36.5 C) (Oral)   Ht 5\' 9"  (1.753 m)   Wt 173 lb (78.5 kg)   SpO2 96% Comment: RA  BMI 25.55 kg/m  General:  well developed, well nourished, in no apparent distress Skin:  no significant moles, warts, or growths Head:  no masses, lesions, or tenderness Eyes:  pupils equal and round, sclera anicteric without injection Ears:  canals  without lesions, TMs shiny without retraction, no obvious effusion, no erythema Nose:  nares patent, septum midline, mucosa normal Throat/Pharynx:  lips and gingiva without lesion; tongue and uvula midline; non-inflamed pharynx; no exudates or postnasal drainage Neck: neck supple without adenopathy, thyromegaly, or masses Lungs:  clear to auscultation, breath sounds equal bilaterally, no respiratory distress Cardio:  regular rate and rhythm without murmurs, heart sounds without clicks or rubs Abdomen:  abdomen soft, nontender; bowel sounds normal; no masses or organomegaly Genital (male): circumcised penis, no lesions or discharge; testes present bilaterally without masses or tenderness Rectal: Deferred Musculoskeletal:  symmetrical muscle groups noted without atrophy or deformity  Extremities:  no clubbing, cyanosis, or edema, no deformities, no skin discoloration Neuro:  gait normal; deep tendon reflexes normal and symmetric Psych: well oriented with normal range of affect and appropriate judgment/insight  Assessment and Plan  Well adult exam - Plan: Comprehensive metabolic panel  History of prediabetes - Plan: Hemoglobin A1c  Hyperlipidemia, unspecified hyperlipidemia type - Plan: Lipid panel  Essential hypertension - Plan: Comprehensive metabolic panel   Well 76 y.o. male. Counseled on diet and exercise. Will check cholesterol and likely offer to come off of pravastatin.  Home readings discussed and detailed in AVS, will keep log and bring monitor to next appt. At that time will suggest coming off of medicine or staying on. Goal BP is <150/90. Based on his interaction with other people who have been on Viagra, it does not seem like he wants to go on this. Suggested he research Cialis and Levitra and let me know if he is interested in starting one of these medications. Immunizations, labs, and further orders as above. Follow up in 4 weeks- will need Tetanus. The patient voiced  understanding and agreement to the plan.  [place], DO 05/24/16 10:49 AM

## 2016-05-28 ENCOUNTER — Telehealth: Payer: Self-pay | Admitting: Family Medicine

## 2016-05-28 NOTE — Telephone Encounter (Signed)
°  Relation to [id number] Call back number:[phone]  Reason for call:  Patient returning call regarding lab results

## 2016-05-28 NOTE — Telephone Encounter (Signed)
Spoke with Patient on 05/28/2016 at approximately 11:31am. Per Dr. Nani Ravens, Patient was advised that his A1C is unchanged and is still considered a prediabetic. And  that his labs were unremarkable, will discuss possible medication changes at his next appointment. Patient verbalized understanding.

## 2016-05-30 ENCOUNTER — Other Ambulatory Visit: Payer: Self-pay | Admitting: Family Medicine

## 2016-05-30 MED ORDER — AMLODIPINE BESYLATE 2.5 MG PO TABS: 2.5000 mg | ORAL_TABLET | Freq: Every day | ORAL | 2 refills | Status: DC

## 2016-05-30 NOTE — Telephone Encounter (Signed)
Rx sent 

## 2016-05-30 NOTE — Telephone Encounter (Signed)
Relation to AD:GNPH Call back [place]: [place], [place] [phone] (Phone) [phone] (Fax)     Reason for call:  Patient requesting 90 day supple of  amLODipine (NORVASC) 2.5 MG tablet, patient states he will run out on Saturday, please advise

## 2016-06-19 ENCOUNTER — Telehealth: Payer: Self-pay | Admitting: *Deleted

## 2016-06-19 NOTE — Telephone Encounter (Signed)
AWV schedule for 2/29/ 18 @815 .

## 2016-06-19 NOTE — Progress Notes (Signed)
Pre visit review using our clinic review tool, if applicable. No additional management support is needed unless otherwise documented below in the visit note. 

## 2016-06-19 NOTE — Progress Notes (Signed)
Subjective:   Billy Burns is a 76 y.o. male who presents for an Initial Medicare Annual Wellness Visit.  Review of Systems  No ROS.  Medicare Wellness Visit. Cardiac Risk Factors include: advanced age (>35men, >73 women);dyslipidemia;male gender;hypertension Sleep patterns: Wakes once to urinate. Sleeps 6-8 hrs/night. Home Safety/Smoke Alarms:  Feels safe in home. Smoke alarms in place.  Living environment; residence and Firearm Safety: Lives with wife and daughter. Seat Belt Safety/Bike Helmet: Wears seat belt.   Counseling:   Eye Exam- wears reading glasses. Dr.Miller every 2 yrs. Pt states has appt next month. Dental- Dr.Bagley every 6 months.  Male:   CCS- Last 05/29/11: Moderate diverticulosis. Removed an adenomatous polyp. F/u 5 years per report letter. Pt states he is going to set up appt. PSA-  Lab Results  Component Value Date   PSA 8.40 (H) 11/17/2008       Objective:    Today's Vitals   06/21/16 0832  BP: (!) 160/76  Pulse: 67  SpO2: 98%  Weight: 172 lb 6.4 oz (78.2 kg)  Height: 5\' 9"  (1.753 m)   Body mass index is 25.46 kg/m.  Current Medications (verified) Outpatient Encounter Prescriptions as of 06/21/2016  Medication Sig  . acetaminophen (TYLENOL) 325 MG tablet Take 500 mg by mouth as needed.   . Alfalfa 500 MG TABS 9-12 TABS/DAY   . amLODipine (NORVASC) 2.5 MG tablet Take 1 tablet (2.5 mg total) by mouth daily.  Marland Kitchen aspirin EC 81 MG tablet Take 81 mg by mouth daily.    Marland Kitchen b complex vitamins tablet Take 1 tablet by mouth daily.  . carvedilol (COREG) 25 MG tablet Take 1 tablet (25 mg total) by mouth 2 (two) times daily with a meal.  . cholecalciferol (VITAMIN D) 1000 UNITS tablet Take 1,000 Units by mouth daily.   . cloNIDine (CATAPRES) 0.1 MG tablet Take 1 tablet (0.1 mg total) by mouth 2 (two) times daily.  . Flaxseed, Linseed, (FLAX SEED OIL PO) Take 1,000 mg by mouth 2 (two) times daily.  . NON FORMULARY Take 2 tablets by mouth daily. Prostate Ess  plus  . NON FORMULARY Take 4 tablets by mouth daily. osteomatrix   . pravastatin (PRAVACHOL) 20 MG tablet Take 1 tablet (20 mg total) by mouth daily.  . vitamin C (ASCORBIC ACID) 500 MG tablet Take 500 mg by mouth as needed.    No facility-administered encounter medications on file as of 06/21/2016.     Allergies (verified) Ace inhibitors; Aspirin; and Toprol xl [metoprolol succinate]   History: Past Medical History:  Diagnosis Date  . Adenomatous colon polyp 06/05/11   Repeat 2018 per Dr. Carlean Purl  . Allergic angioedema    ACE inhibitor/beta blocker  . Diverticulosis   . Gastroenteritis 2012  . Headache(784.0)   . Hyperlipidemia   . Hypertension   . Influenza A 02/2011  . Intermittent vertigo   . Microhematuria 01/06/2012   Dr. Orland Mustard, urologist in [place], did cysto and bladder bx 06/24/12 and it was unremarkable  . Prostate cancer [facility]) 2007   Dr. Harlow Asa ([facility] in [facility])  Pt is s/p brachytherapy 2011.  Jan 2015 PSA 0.6 ng/ml.  . 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 Caryl Comes)  . Syncope    ? Malignant vasovagal syncope-Pacer placed   Past Surgical History:  Procedure Laterality Date  . CATARACT EXTRACTION  1984, 1994   (lens implants bilat)  . COLONOSCOPY  06/05/11   61mm sigmoid polyp removed (+  adenomatous), moderate diverticulosis, internal hemorrhoids.  Repeat 2018.  Marland Kitchen CYSTOSTOMY W/ BLADDER BIOPSY  06/24/12   Benign  . PACEMAKER INSERTION  02/2011   [employer]. [id number] Dual chamber; place in [place], [place] when pt visiting there for holiday 02/2011.  Marland Kitchen RADIOACTIVE SEED IMPLANT     prostate 07/2009.  PSA dropped to below 2 after this (Dr. Harlow Asa at [facility] in [facility])  . TONSILLECTOMY  1950  . TRANSTHORACIC ECHOCARDIOGRAM  02/2011   Normal   Family History  Problem Relation Age of Onset  . Stroke Mother     during endarterectomy  . Colon cancer Father   . Diabetes Neg Hx    Social History   Occupational History  .  Retired     Chartered loss adjuster for [place] History Main Topics  . Smoking status: Former Smoker    Quit date: 03/26/1964  . Smokeless tobacco: Never Used  . Alcohol use No  . Drug use: No  . Sexual activity: Not on file   Tobacco Counseling Counseling given: No   Activities of Daily Living In your present state of health, do you have any difficulty performing the following activities: 06/21/2016  Hearing? N  Vision? N  Difficulty concentrating or making decisions? N  Walking or climbing stairs? N  Dressing or bathing? N  Doing errands, shopping? N  Preparing Food and eating ? N  Using the Toilet? N  In the past six months, have you accidently leaked urine? N  Do you have problems with loss of bowel control? N  Managing your Medications? N  Managing your Finances? N  Housekeeping or managing your Housekeeping? N  Some recent data might be hidden    Immunizations and Health Maintenance Immunization History  Administered Date(s) Administered  . Influenza Split 01/14/2012  . Influenza, High Dose Seasonal PF 03/14/2015  . Influenza,inj,Quad PF,36+ Mos 01/08/2013  . Pneumococcal Conjugate-13 05/15/2013  . Pneumococcal Polysaccharide-23 11/14/2007  . Td 10/02/2005  . Zoster 11/14/2010   Health Maintenance Due  Topic Date Due  . TETANUS/TDAP  10/03/2015  . INFLUENZA VACCINE  10/25/2015  . COLONOSCOPY  05/28/2016    Patient Care Team: Shelda Pal, DO as PCP - General (Family Medicine) Milana Na, MD as Consulting Physician (Urology) Deboraha Sprang, MD (Cardiology)  Indicate any recent Medical Services you may have received from other than [facility] providers in the past year (date may be approximate).    Assessment:   This is a routine wellness examination for Billy Burns. Physical assessment deferred to PCP.   Hearing/Vision screen  Visual Acuity Screening   Right eye Left eye Both eyes  Without correction: 20/20 20/20 20/20   With  correction:     Hearing Screening Comments: Able to hear conversational tones w/o difficulty. No issues reported.    Dietary issues and exercise activities discussed: Current Exercise Habits: Home exercise routine, Type of exercise: walking, Time (Minutes): 20, Frequency (Times/Week): 7, Weekly Exercise (Minutes/Week): 140, Intensity: Mild   Diet (meal preparation, eat out, water intake, caffeinated beverages, dairy products, fruits and vegetables): in general, a "healthy" diet    Goals      Patient Stated   . 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       Depression Screen PHQ 2/9 Scores 06/21/2016 01/02/2016 11/10/2014  PHQ - 2 Score 0 0 0    Fall Risk Fall Risk  06/21/2016 01/02/2016 11/10/2014  Falls in the past year? No No No    Cognitive Function: Ad8 score reviewed for issues:  Issues making decisions:no  Less interest in hobbies / activities:no  Repeats questions, stories (family complaining):no  Trouble using ordinary gadgets (microwave, computer, phone):no  Forgets the month or year: no  Mismanaging finances: no  Daily problems with thinking and/or memory:no Ad8 score is=0            Screening Tests Health Maintenance  Topic Date Due  . TETANUS/TDAP  10/03/2015  . INFLUENZA VACCINE  10/25/2015  . COLONOSCOPY  05/28/2016  . PNA vac Low Risk Adult  Completed        Plan:   Follow up with PCP as scheduled.   During the course of the visit Billy Burns was educated and counseled about the following appropriate screening and preventive services:   Vaccines to include Pneumoccal, Influenza, Td, HCV  Colorectal cancer screening  Cardiovascular disease screening  Diabetes screening  Glaucoma screening  Nutrition counseling  Prostate cancer screening   Patient Instructions (the written plan) were given to the patient.   Billy Burns, [place]   06/21/2016

## 2016-06-21 ENCOUNTER — Encounter: Payer: Self-pay | Admitting: Family Medicine

## 2016-06-21 ENCOUNTER — Ambulatory Visit (INDEPENDENT_AMBULATORY_CARE_PROVIDER_SITE_OTHER): Payer: Medicare HMO | Admitting: Family Medicine

## 2016-06-21 VITALS — BP 160/76 | HR 67 | Ht 69.0 in | Wt 172.4 lb

## 2016-06-21 DIAGNOSIS — Z9189 Other specified personal risk factors, not elsewhere classified: Secondary | ICD-10-CM

## 2016-06-21 DIAGNOSIS — Z0000 Encounter for general adult medical examination without abnormal findings: Secondary | ICD-10-CM

## 2016-06-21 DIAGNOSIS — R030 Elevated blood-pressure reading, without diagnosis of hypertension: Secondary | ICD-10-CM

## 2016-06-21 NOTE — Progress Notes (Signed)
Chief Complaint  Patient presents with  . Medicare Wellness    Subjective Billy Burns is a 76 y.o. male who presents for hypertension follow up. He does monitor home blood pressures. Blood pressures ranging from 110-130's/60-70's on average. He is compliant with medications- Norvasc 2.5 mg daily. Patient has these side effects of medication: none He is adhering to a healthy diet overall. Current exercise: Walking   Past Medical History:  Diagnosis Date  . Adenomatous colon polyp 06/05/11   Repeat 2018 per Dr. Carlean Purl  . Allergic angioedema    ACE inhibitor/beta blocker  . Diverticulosis   . Gastroenteritis 2012  . Headache(784.0)   . Hyperlipidemia   . Hypertension   . Influenza A 02/2011  . Intermittent vertigo   . Microhematuria 01/06/2012   Dr. Orland Mustard, urologist in [place], did cysto and bladder bx 06/24/12 and it was unremarkable  . Prostate cancer [facility]) 2007   Dr. Harlow Asa ([facility])  Pt is s/p brachytherapy 2011.  Jan 2015 PSA 0.6 ng/ml.  . 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 Caryl Comes)  . Syncope    ? Malignant vasovagal syncope-Pacer placed   Family History  Problem Relation Age of Onset  . Stroke Mother     during endarterectomy  . Colon cancer Father   . Diabetes Neg Hx      Medications Current Outpatient Prescriptions on File Prior to Visit  Medication Sig Dispense Refill  . acetaminophen (TYLENOL) 325 MG tablet Take 500 mg by mouth as needed.     . Alfalfa 500 MG TABS 9-12 TABS/DAY     . amLODipine (NORVASC) 2.5 MG tablet Take 1 tablet (2.5 mg total) by mouth daily. 90 tablet 2  . aspirin EC 81 MG tablet Take 81 mg by mouth daily.      Marland Kitchen b complex vitamins tablet Take 1 tablet by mouth daily.    . carvedilol (COREG) 25 MG tablet Take 1 tablet (25 mg total) by mouth 2 (two) times daily with a meal. 180 tablet 1  . cholecalciferol (VITAMIN D) 1000 UNITS tablet Take 1,000 Units by mouth daily.     . cloNIDine (CATAPRES) 0.1  MG tablet Take 1 tablet (0.1 mg total) by mouth 2 (two) times daily. 180 tablet 1  . Flaxseed, Linseed, (FLAX SEED OIL PO) Take 1,000 mg by mouth 2 (two) times daily.    . NON FORMULARY Take 2 tablets by mouth daily. Prostate Ess plus    . NON FORMULARY Take 4 tablets by mouth daily. osteomatrix     . pravastatin (PRAVACHOL) 20 MG tablet Take 1 tablet (20 mg total) by mouth daily. 90 tablet 3  . vitamin C (ASCORBIC ACID) 500 MG tablet Take 500 mg by mouth as needed.      Allergies Allergies  Allergen Reactions  . Ace Inhibitors Other (See Comments)    REACTION: Angioedema  . Aspirin Other (See Comments)    REACTION: Stomach Discomfort/TAKES 81 MG BUT CANT TAKE 325 MG ASA PER PT.  . Toprol Xl [Metoprolol Succinate] Other (See Comments)    Angioedema    Review of Systems Cardiovascular: no chest pain Respiratory:  no shortness of breath  Exam BP (!) 160/76 (BP Location: Left Arm, Patient Position: Sitting, Cuff Size: Normal)   Pulse 67   Ht 5\' 9"  (1.753 m)   Wt 172 lb 6.4 oz (78.2 kg)   SpO2 98%   BMI 25.46 kg/m  General:  well developed,  well nourished, in no apparent distress Skin:  warm, no pallor or diaphoresis Eyes:  pupils equal and round, sclera anicteric without injection Heart :RRR, no murmurs, no bruits, no LE edema Lungs:  clear to auscultation, no accessory muscle use Psych: well oriented with normal range of affect and appropriate judgment/insight  Encounter for Medicare annual wellness exam  White coat syndrome without hypertension  Stop Norvasc. He will be keeping home BP's and let us know in a week if he starts running higher.  Home BP cuff reading similar to our readings.  He appears to be well functioning, I will keep him on ASA and pravastatin for now. Will reassess risk of bleeding at every visit.  Counseled on diet and exercise. F/u in 6 mo or prn. The patient voiced understanding and agreement to the plan.  [place], DO 06/21/16   9:25 AM

## 2016-06-21 NOTE — Patient Instructions (Addendum)
Stop taking Norvasc (amlodipine).  Take your blood pressure daily for 1 week (still 4 times daily) and let me know the averages. My goal for you is <150 on the top and less than 90 on the bottom.   Stay on the pravastatin and aspirin for now.

## 2016-06-28 ENCOUNTER — Telehealth: Payer: Self-pay | Admitting: Family Medicine

## 2016-06-28 NOTE — Telephone Encounter (Signed)
Pt dropped off BP readings hx. verfd pt. Put documents in bin for physician.

## 2016-06-29 NOTE — Telephone Encounter (Signed)
Readings received and forwarded to PCP for review.

## 2016-07-30 ENCOUNTER — Emergency Department (HOSPITAL_BASED_OUTPATIENT_CLINIC_OR_DEPARTMENT_OTHER)
Admission: EM | Admit: 2016-07-30 | Discharge: 2016-07-30 | Disposition: A | Discharge status: Home / Self Care | Payer: Medicare HMO | Attending: Emergency Medicine | Admitting: Emergency Medicine

## 2016-07-30 ENCOUNTER — Encounter (HOSPITAL_BASED_OUTPATIENT_CLINIC_OR_DEPARTMENT_OTHER): Payer: Self-pay | Admitting: Emergency Medicine

## 2016-07-30 DIAGNOSIS — I10 Essential (primary) hypertension: Secondary | ICD-10-CM | POA: Insufficient documentation

## 2016-07-30 DIAGNOSIS — R197 Diarrhea, unspecified: Secondary | ICD-10-CM | POA: Diagnosis not present

## 2016-07-30 DIAGNOSIS — R1084 Generalized abdominal pain: Secondary | ICD-10-CM | POA: Insufficient documentation

## 2016-07-30 DIAGNOSIS — Z950 Presence of cardiac pacemaker: Secondary | ICD-10-CM | POA: Diagnosis not present

## 2016-07-30 DIAGNOSIS — Z7982 Long term (current) use of aspirin: Secondary | ICD-10-CM | POA: Diagnosis not present

## 2016-07-30 DIAGNOSIS — R112 Nausea with vomiting, unspecified: Secondary | ICD-10-CM

## 2016-07-30 DIAGNOSIS — Z8546 Personal history of malignant neoplasm of prostate: Secondary | ICD-10-CM | POA: Insufficient documentation

## 2016-07-30 DIAGNOSIS — Z87891 Personal history of nicotine dependence: Secondary | ICD-10-CM | POA: Insufficient documentation

## 2016-07-30 LAB — COMPREHENSIVE METABOLIC PANEL
ALBUMIN: 4.5 g/dL (ref 3.5–5.0)
ALK PHOS: 78 U/L (ref 38–126)
ALT: 28 U/L (ref 17–63)
AST: 32 U/L (ref 15–41)
Anion gap: 8 (ref 5–15)
BUN: 13 mg/dL (ref 6–20)
CALCIUM: 9.2 mg/dL (ref 8.9–10.3)
CO2: 26 mmol/L (ref 22–32)
CREATININE: 0.98 mg/dL (ref 0.61–1.24)
Chloride: 105 mmol/L (ref 101–111)
GFR calc Af Amer: >60 mL/min (ref >60)
GFR calc non Af Amer: >60 mL/min (ref >60)
GLUCOSE: 120 mg/dL — AB (ref 65–99)
Potassium: 3.5 mmol/L (ref 3.5–5.1)
SODIUM: 139 mmol/L (ref 135–145)
Total Bilirubin: 0.6 mg/dL (ref 0.3–1.2)
Total Protein: 7.5 g/dL (ref 6.5–8.1)

## 2016-07-30 LAB — CBC
HCT: 43.9 % (ref 39.0–52.0)
HEMOGLOBIN: 15.1 g/dL (ref 13.0–17.0)
MCH: 30.2 pg (ref 26.0–34.0)
MCHC: 34.4 g/dL (ref 30.0–36.0)
MCV: 87.8 fL (ref 78.0–100.0)
PLATELETS: 263 10*3/uL (ref 150–400)
RBC: 5 MIL/uL (ref 4.22–5.81)
RDW: 13.5 % (ref 11.5–15.5)
WBC: 17.6 10*3/uL — ABNORMAL HIGH (ref 4.0–10.5)

## 2016-07-30 LAB — LIPASE, BLOOD: Lipase: 19 U/L (ref 11–51)

## 2016-07-30 LAB — OCCULT BLOOD X 1 CARD TO LAB, STOOL: FECAL OCCULT BLD: POSITIVE — AB

## 2016-07-30 MED ORDER — SODIUM CHLORIDE 0.9 % IV BOLUS (SEPSIS)
1000.0000 mL | Freq: Once | INTRAVENOUS | Status: AC
Start: 2016-07-30 — End: 2016-07-30
  Administered 2016-07-30: 1000 mL via INTRAVENOUS

## 2016-07-30 MED ORDER — METOCLOPRAMIDE HCL 10 MG PO TABS: 10.0000 mg | ORAL_TABLET | Freq: Four times a day (QID) | ORAL | 0 refills | Status: DC | PRN

## 2016-07-30 MED FILL — METOCLOPRAMIDE 10 MG TABLET: 10 | 2 days supply | Qty: 6 | Fill #0

## 2016-07-30 NOTE — ED Provider Notes (Addendum)
[place] DEPT [facility] Provider Note   CSN: [id number] Arrival date & time: 07/30/16  0859     History   Chief Complaint Chief Complaint  Patient presents with  . Abdominal Pain    HPI Billy Burns is a 76 y.o. male.  HPI complains of vomiting 2 episodes and diarrhea "many episodes" onset 4 AM today. He also had a single episode of diffuse abdominal pain lasting 15 minutes which has resolved. He presently feels well without treatment. No other associated symptoms. Nothing makes symptoms better or worse.  Past Medical History:  Diagnosis Date  . Adenomatous colon polyp 06/05/11   Repeat 2018 per Dr. Carlean Purl  . Allergic angioedema    ACE inhibitor/beta blocker  . Diverticulosis   . Gastroenteritis 2012  . Headache(784.0)   . Hyperlipidemia   . Hypertension   . Influenza A 02/2011  . Intermittent vertigo   . Microhematuria 01/06/2012   Dr. Orland Mustard, urologist in [place], did cysto and bladder bx 06/24/12 and it was unremarkable  . Prostate cancer [facility]) 2007   Dr. Harlow Asa ([facility] in [facility])  Pt is s/p brachytherapy 2011.  Jan 2015 PSA 0.6 ng/ml.  . 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 Caryl Comes)  . Syncope    ? Malignant vasovagal syncope-Pacer placed    Patient Active Problem List   Diagnosis Date Noted  . Smoking 03/04/2013  . Hematuria 01/06/2012  . BPPV (benign paroxysmal positional vertigo) 08/06/2011  . Syncope   . Sinus arrest 04/09/2011  . Pacemaker-[place] 04/02/2011  . PROSTATE CANCER 11/09/2009  . Hyperlipidemia 11/14/2007  . Essential hypertension 10/25/2006    Past Surgical History:  Procedure Laterality Date  . CATARACT EXTRACTION  1984, 1994   (lens implants bilat)  . COLONOSCOPY  06/05/11   62mm sigmoid polyp removed (+adenomatous), moderate diverticulosis, internal hemorrhoids.  Repeat 2018.  Marland Kitchen CYSTOSTOMY W/ BLADDER BIOPSY  06/24/12   Benign  . PACEMAKER INSERTION  02/2011   [employer]. Q300 Dual chamber; place in  [place], [place] when pt visiting there for holiday 02/2011.  Marland Kitchen RADIOACTIVE SEED IMPLANT     prostate 07/2009.  PSA dropped to below 2 after this (Dr. Harlow Asa at [facility] in [facility])  . TONSILLECTOMY  1950  . TRANSTHORACIC ECHOCARDIOGRAM  02/2011   Normal       Home Medications    Prior to Admission medications   Medication Sig Start Date End Date Taking? Authorizing Provider  acetaminophen (TYLENOL) 325 MG tablet Take 500 mg by mouth as needed.     [provider]  Alfalfa 500 MG TABS 9-12 TABS/DAY     [provider]  aspirin EC 81 MG tablet Take 81 mg by mouth daily.      [provider]  b complex vitamins tablet Take 1 tablet by mouth daily.    [provider]  carvedilol (COREG) 25 MG tablet Take 1 tablet (25 mg total) by mouth 2 (two) times daily with a meal. 01/02/16   McGowen, Adrian Blackwater, MD  cholecalciferol (VITAMIN D) 1000 UNITS tablet Take 1,000 Units by mouth daily.     [provider]  cloNIDine (CATAPRES) 0.1 MG tablet Take 1 tablet (0.1 mg total) by mouth 2 (two) times daily. 01/02/16   McGowen, Adrian Blackwater, MD  Flaxseed, Linseed, (FLAX SEED OIL PO) Take 1,000 mg by mouth 2 (two) times daily.    [provider]  NON FORMULARY Take 2 tablets by mouth daily.  Prostate Ess plus    [provider]  NON FORMULARY Take 4 tablets by mouth daily. osteomatrix     [provider]  pravastatin (PRAVACHOL) 20 MG tablet Take 1 tablet (20 mg total) by mouth daily. 09/22/15   McGowen, Adrian Blackwater, MD  vitamin C (ASCORBIC ACID) 500 MG tablet Take 500 mg by mouth as needed.     [provider]    Family History Family History  Problem Relation Age of Onset  . Stroke Mother     during endarterectomy  . Colon cancer Father   . Diabetes Neg Hx     Social History Social History  Substance Use Topics  . Smoking status: Former Smoker    Quit date: 03/26/1964  . Smokeless tobacco: Never Used  . Alcohol use  No     Allergies   Ace inhibitors; Aspirin; and Toprol xl [metoprolol succinate]   Review of Systems Review of Systems  Constitutional: Negative.   HENT: Negative.   Respiratory: Negative.   Cardiovascular: Negative.   Gastrointestinal: Positive for abdominal pain, diarrhea and vomiting.  Musculoskeletal: Negative.   Skin: Negative.   Neurological: Negative.   Psychiatric/Behavioral: Negative.   All other systems reviewed and are negative.    Physical Exam Updated Vital Signs BP (!) 168/86 (BP Location: Left Arm)   Pulse 83   Temp 98.2 F (36.8 C) (Oral)   Resp 18   Ht 5\' 9"  (1.753 m)   Wt 171 lb (77.6 kg)   SpO2 97%   BMI 25.25 kg/m   Physical Exam  Constitutional: He appears well-developed and well-nourished.  HENT:  Head: Normocephalic and atraumatic.  Eyes: Conjunctivae are normal. Pupils are equal, round, and reactive to light.  Neck: Neck supple. No tracheal deviation present. No thyromegaly present.  Cardiovascular: Normal rate and regular rhythm.   No murmur heard. Pulmonary/Chest: Effort normal and breath sounds normal.  Abdominal: Soft. Bowel sounds are normal. He exhibits no distension. There is no tenderness.  Genitourinary: Penis normal. Rectal exam shows guaiac positive stool.  Genitourinary Comments: Normal male genitalia. Rectum normal tone brown stool no gross blood  Musculoskeletal: Normal range of motion. He exhibits no edema or tenderness.  Neurological: He is alert. Coordination normal.  Skin: Skin is warm and dry. No rash noted.  Psychiatric: He has a normal mood and affect.  Nursing note and vitals reviewed.    ED Treatments / Results  Labs (all labs ordered are listed, but only abnormal results are displayed) Labs Reviewed  COMPREHENSIVE METABOLIC PANEL - Abnormal; Notable for the following:       Result Value   Glucose, Bld 120 (*)    All other components within normal limits  CBC - Abnormal; Notable for the following:    WBC  17.6 (*)    All other components within normal limits  LIPASE, BLOOD    EKG  EKG Interpretation None       Radiology No results found.  Procedures Procedures (including critical care time)  Medications Ordered in ED Medications  sodium chloride 0.9 % bolus 1,000 mL (not administered)     Initial Impression / Assessment and Plan / ED Course  I have reviewed the triage vital signs and the nursing notes.  Pertinent labs & imaging results that were available during my care of the patient were reviewed by me and considered in my medical decision making (see chart for details).     11 AM patient feels well. Not lightheaded on  standing. No further episodes of abdominal pain. He reports that after my initial exam he went to the bathroom and had diarrheal mixed with blood. Rectal exam performed. Brown stool no gross blood. Stool sent to lab for Hemoccult Feel that imaging of his abdomen is warranted. He feels well. Looks well abdomen exam benign. Advised him about his leukocytosis. He reports that he lives 2-5 minutes away from the hospital and can return if feels worse. He is in agreement with no imaging. Plan prescription Reglan, Imodium, encourage oral hydration. Patient reports that he is due for colonoscopy. I suggest that he call his PCP to arrange for colonoscopy Final Clinical Impressions(s) / ED Diagnoses  Diagnosis #1 nausea vomiting diarrhea #2 abdominal pain-resolved Final diagnoses:  None   #3 Hemoccult positive stool New Prescriptions New Prescriptions   No medications on file     Orlie Dakin, MD 07/30/16 1118    Orlie Dakin, MD 07/30/16 1121

## 2016-07-30 NOTE — ED Triage Notes (Signed)
Patient reports that he had pain when he woke up to his generalized abdominal pain. The patient then reports that he threw up. Reports since he has had several episodes of diarrhea and dry heaves  - reports that he goes to the restroom has dry heaves and then episode of diarrhea  - denies nausea in between these episodes.

## 2016-07-30 NOTE — Discharge Instructions (Signed)
Take Imodium as directed for diarrhea. Avoid milk or foods containing milk such as cheese or ice cream. Take the medication prescribed as needed for nausea.Make sure that you drink at least six 8 ounce glasses of water or Gatorade each day in order to stay well-hydrated. You had trace amounts of blood on rectal exam when we examine you. Make sure that you schedule your appointment for your colonoscopy for within the next few weeks. Return if you feel worse for any reason, develop worsening abdominal pain or fever or are unable to hold down fluids without vomiting after taking the medication prescribed

## 2016-08-09 ENCOUNTER — Encounter: Payer: Self-pay | Admitting: Internal Medicine

## 2016-09-03 ENCOUNTER — Telehealth: Payer: Self-pay | Admitting: Family Medicine

## 2016-09-03 NOTE — Telephone Encounter (Signed)
Please advise.//AB/CMA 

## 2016-09-03 NOTE — Telephone Encounter (Signed)
Patient stated he needs a tetanus shot is there an order?

## 2016-09-04 NOTE — Telephone Encounter (Signed)
OK to give

## 2016-09-05 NOTE — Telephone Encounter (Signed)
Called and [facility] @ 7:53am @ [phone]) informing the pt that Dr. Napoleon Form the Tetanus shot.//AB/CMA

## 2016-09-06 ENCOUNTER — Ambulatory Visit (INDEPENDENT_AMBULATORY_CARE_PROVIDER_SITE_OTHER): Payer: Medicare HMO | Admitting: Behavioral Health

## 2016-09-06 DIAGNOSIS — Z23 Encounter for immunization: Secondary | ICD-10-CM | POA: Diagnosis not present

## 2016-09-06 NOTE — Progress Notes (Signed)
Pre visit review using our clinic review tool, if applicable. No additional management support is needed unless otherwise documented below in the visit note.  Patient came in clinic for Tdap vaccination. Dr. Nani Ravens gave verbal order to administer the vaccine. IM injection was given in the left deltoid. Patient tolerated injection well. No signs or symptoms of a reaction before leaving the nurse visit.

## 2016-09-12 ENCOUNTER — Ambulatory Visit (INDEPENDENT_AMBULATORY_CARE_PROVIDER_SITE_OTHER): Payer: Medicare HMO | Admitting: *Deleted

## 2016-09-12 DIAGNOSIS — I495 Sick sinus syndrome: Secondary | ICD-10-CM

## 2016-09-12 LAB — CUP PACEART INCLINIC DEVICE CHECK
Date Time Interrogation Session: 20180620040000
Implantable Lead Implant Date: 20121227
Implantable Lead Serial Number: 552434
Implantable Pulse Generator Implant Date: 20121227
Lead Channel Pacing Threshold Amplitude: 0.9 V
Lead Channel Pacing Threshold Amplitude: 1.5 V
Lead Channel Pacing Threshold Pulse Width: 0.4 ms
Lead Channel Sensing Intrinsic Amplitude: 19.7 mV
Lead Channel Setting Pacing Amplitude: 2 V
Lead Channel Setting Pacing Pulse Width: 0.4 ms
Lead Channel Setting Sensing Sensitivity: 2.5 mV
MDC IDC LEAD IMPLANT DT: 20121227
MDC IDC LEAD LOCATION: 753859
MDC IDC LEAD LOCATION: 753859
MDC IDC LEAD SERIAL: 502103
MDC IDC MSMT LEADCHNL RA IMPEDANCE VALUE: 453 Ohm
MDC IDC MSMT LEADCHNL RA PACING THRESHOLD PULSEWIDTH: 0.4 ms
MDC IDC MSMT LEADCHNL RA SENSING INTR AMPL: 7.8 mV
MDC IDC MSMT LEADCHNL RV IMPEDANCE VALUE: 499 Ohm
MDC IDC SET LEADCHNL RV PACING AMPLITUDE: 2.4 V
MDC IDC STAT BRADY RA PERCENT PACED: 28 %
MDC IDC STAT BRADY RV PERCENT PACED: 8 %
Pulse Gen Serial Number: 120835

## 2016-09-12 NOTE — Progress Notes (Signed)
Pacemaker check in clinic. Normal device function. Thresholds, sensing, impedances consistent with previous measurements. Device programmed to maximize longevity. 4 mode switches (<1% burden), EGMs appear AT, longest 3 seconds, max A rate 262 bpm. No high ventricular rates noted. Device programmed at appropriate safety margins. Histogram distribution appropriate for patient activity level. Device programmed to optimize intrinsic conduction. Estimated longevity 5.5 years. Patient education completed. ROV with SK in 12 months.

## 2016-09-13 DIAGNOSIS — R69 Illness, unspecified: Secondary | ICD-10-CM | POA: Diagnosis not present

## 2016-10-10 ENCOUNTER — Encounter: Payer: Medicare HMO | Admitting: Internal Medicine

## 2016-10-15 ENCOUNTER — Other Ambulatory Visit: Payer: Self-pay | Admitting: Family Medicine

## 2016-10-18 ENCOUNTER — Other Ambulatory Visit: Payer: Self-pay | Admitting: *Deleted

## 2016-10-18 MED ORDER — CARVEDILOL 25 MG PO TABS: 25.0000 mg | ORAL_TABLET | Freq: Two times a day (BID) | ORAL | 0 refills | Status: DC

## 2016-10-18 NOTE — Telephone Encounter (Signed)
Rx approved.   However only given #180.  The patient needs further evaluation and/or laboratory testing before further refills are given.  Ask patient to make an appointment to follow-up on cholesterol.//AB/CMA

## 2016-10-29 ENCOUNTER — Other Ambulatory Visit: Payer: Self-pay | Admitting: Family Medicine

## 2016-10-31 ENCOUNTER — Other Ambulatory Visit: Payer: Self-pay | Admitting: *Deleted

## 2016-10-31 MED ORDER — PRAVASTATIN SODIUM 20 MG PO TABS: 20.0000 mg | ORAL_TABLET | Freq: Every day | ORAL | 1 refills | Status: DC

## 2016-10-31 NOTE — Telephone Encounter (Signed)
Rx approved and sent to the pharmacy by e-script.//AB/CMA 

## 2017-01-14 ENCOUNTER — Other Ambulatory Visit: Payer: Self-pay | Admitting: Family Medicine

## 2017-01-14 ENCOUNTER — Telehealth: Payer: Self-pay | Admitting: Family Medicine

## 2017-01-14 MED ORDER — CLONIDINE HCL 0.1 MG PO TABS: 0.1000 mg | ORAL_TABLET | Freq: Two times a day (BID) | ORAL | 1 refills | Status: DC

## 2017-01-14 MED ORDER — CARVEDILOL 25 MG PO TABS: 25.0000 mg | ORAL_TABLET | Freq: Two times a day (BID) | ORAL | 0 refills | Status: DC

## 2017-01-14 NOTE — Telephone Encounter (Signed)
Pt request Klonopin refill. Pt states Dr Anitra Lauth at [facility] originally prescribed but now Nani Ravens is new PCP. Pt states uses [facility] on [address]. Please advise.

## 2017-01-14 NOTE — Telephone Encounter (Signed)
Based on expiration date and previous progress notes dating into 2016, I think clonidine is the correct medication. This has been refilled. He is due for his 6 mo med check visit with me as well. Please schedule. TY.

## 2017-01-14 NOTE — Telephone Encounter (Signed)
Patient informed of medication sent in/scheduled appt for this coming Wednesday am

## 2017-01-16 ENCOUNTER — Encounter: Payer: Self-pay | Admitting: Family Medicine

## 2017-01-16 ENCOUNTER — Ambulatory Visit (INDEPENDENT_AMBULATORY_CARE_PROVIDER_SITE_OTHER): Payer: Medicare HMO | Admitting: Family Medicine

## 2017-01-16 VITALS — BP 122/72 | HR 60 | Temp 97.6°F | Ht 69.0 in | Wt 167.1 lb

## 2017-01-16 DIAGNOSIS — I10 Essential (primary) hypertension: Secondary | ICD-10-CM | POA: Diagnosis not present

## 2017-01-16 DIAGNOSIS — E785 Hyperlipidemia, unspecified: Secondary | ICD-10-CM | POA: Diagnosis not present

## 2017-01-16 NOTE — Progress Notes (Signed)
Chief Complaint  Patient presents with  . Follow-up    medication    Subjective Billy Burns is a 76 y.o. male who presents for hypertension follow up. He does monitor home blood pressures. Blood pressures ranging from 120's/70-80's on average. He is compliant with medications-Coreg 25 mg twice daily, clonidine 0.1 mg twice daily. Patient has these side effects of medication: none He is adhering to a healthy diet overall. Current exercise: walking, active around house.  Dyslipidemia Patient presents for dyslipidemia follow up. He is currently on pravastatin 20 mg daily.  We discussed the lack of concensus for him to be on a statin given his age.  He would like to continue this medicine. Compliance with treatment thus far has been good. He denies myalgias. He is adhering to a low sodium and low fat diet. The patient exercises daily.  The patient is not known to have coexisting coronary artery disease.    Past Medical History:  Diagnosis Date  . Adenomatous colon polyp 06/05/11   Repeat 2018 per Dr. Carlean Purl  . Allergic angioedema    ACE inhibitor/beta blocker  . Diverticulosis   . Gastroenteritis 2012  . Headache(784.0)   . Hyperlipidemia   . Hypertension   . Influenza A 02/2011  . Intermittent vertigo   . Microhematuria 01/06/2012   Dr. Orland Mustard, urologist in [place], did cysto and bladder bx 06/24/12 and it was unremarkable  . Prostate cancer [facility]) 2007   Dr. Harlow Asa ([facility] in [facility])  Pt is s/p brachytherapy 2011.  Jan 2015 PSA 0.6 ng/ml.  . 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 Caryl Comes)  . Syncope    ? Malignant vasovagal syncope-Pacer placed   Family History  Problem Relation Age of Onset  . Stroke Mother        during endarterectomy  . Colon cancer Father   . Diabetes Neg Hx      Medications Current Outpatient Prescriptions on File Prior to Visit  Medication Sig Dispense Refill  . acetaminophen (TYLENOL) 325 MG tablet Take 500 mg by  mouth as needed.     . Alfalfa 500 MG TABS 9-12 TABS/DAY     . aspirin EC 81 MG tablet Take 81 mg by mouth daily.      Marland Kitchen b complex vitamins tablet Take 1 tablet by mouth daily.    . carvedilol (COREG) 25 MG tablet TAKE 1 TABLET BY MOUTH TWICE DAILY WITH  MEALS 180 tablet 0  . carvedilol (COREG) 25 MG tablet Take 1 tablet (25 mg total) by mouth 2 (two) times daily with a meal. 180 tablet 0  . cholecalciferol (VITAMIN D) 1000 UNITS tablet Take 1,000 Units by mouth daily.     . cloNIDine (CATAPRES) 0.1 MG tablet Take 1 tablet (0.1 mg total) by mouth 2 (two) times daily. 180 tablet 1  . Flaxseed, Linseed, (FLAX SEED OIL PO) Take 1,000 mg by mouth 2 (two) times daily.    . metoCLOPramide (REGLAN) 10 MG tablet Take 1 tablet (10 mg total) by mouth every 6 (six) hours as needed for nausea (nausea/headache). 6 tablet 0  . NON FORMULARY Take 2 tablets by mouth daily. Prostate Ess plus    . NON FORMULARY Take 4 tablets by mouth daily. osteomatrix     . pravastatin (PRAVACHOL) 20 MG tablet Take 1 tablet (20 mg total) by mouth daily. 90 tablet 1  . vitamin C (ASCORBIC ACID) 500 MG tablet Take 500 mg by mouth as needed.  Allergies Allergies  Allergen Reactions  . Ace Inhibitors Other (See Comments)    REACTION: Angioedema  . Aspirin Other (See Comments)    REACTION: Stomach Discomfort/TAKES 81 MG BUT CANT TAKE 325 MG ASA PER PT.  . Toprol Xl [Metoprolol Succinate] Other (See Comments)    Angioedema    Review of Systems Cardiovascular: no chest pain Respiratory:  no shortness of breath  Exam BP 122/72 (BP Location: Left Arm, Patient Position: Sitting, Cuff Size: Normal)   Pulse 60   Temp 97.6 F (36.4 C) (Oral)   Ht 5\' 9"  (1.753 m)   Wt 167 lb 2 oz (75.8 kg)   SpO2 97%   BMI 24.68 kg/m  General:  well developed, well nourished, in no apparent distress Skin:  warm, no pallor or diaphoresis Eyes:  pupils equal and round, sclera anicteric without injection Heart :RRR, no bruits, no LE  edema Lungs:  clear to auscultation, no accessory muscle use Psych: well oriented with normal range of affect and appropriate judgment/insight  Essential hypertension  Hyperlipidemia, unspecified hyperlipidemia type  Cont Coreg and Clonidine Counseled on diet and exercise, encouraged him to lift weights.  Counseled on the risks and benefits of statin therapy at his age.  Given his good current quality of life, I said to him it would be reasonable to stay on this medicine for now, to which he agrees to stay on as he is tolerating well. F/u in 6 mo. The patient voiced understanding and agreement to the plan.  [place], DO 01/16/17  9:36 AM

## 2017-01-16 NOTE — Patient Instructions (Addendum)
Lift weights.  Let us know if you need anything.

## 2017-01-16 NOTE — Progress Notes (Signed)
Pre visit review using our clinic review tool, if applicable. No additional management support is needed unless otherwise documented below in the visit note. 

## 2017-03-08 ENCOUNTER — Encounter: Payer: Self-pay | Admitting: Internal Medicine

## 2017-03-13 ENCOUNTER — Ambulatory Visit: Payer: Medicare HMO | Admitting: Internal Medicine

## 2017-03-13 VITALS — BP 160/80 | HR 65 | Ht 69.0 in | Wt 170.0 lb

## 2017-03-13 DIAGNOSIS — Z950 Presence of cardiac pacemaker: Secondary | ICD-10-CM | POA: Diagnosis not present

## 2017-03-13 DIAGNOSIS — I495 Sick sinus syndrome: Secondary | ICD-10-CM | POA: Diagnosis not present

## 2017-03-13 NOTE — Progress Notes (Signed)
Patient Care Team: Shelda Pal, DO as PCP - General (Family Medicine) Puschinsky, Fransico Him., MD as Consulting Physician (Urology) Deboraha Sprang, MD (Cardiology) and   HPI  Billy Burns is a 76 y.o. 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  There have been no palpitations, lightheadedness or syncope.   His new PCP has stopped his amlodipine because  BP at home is 130s  Past Medical History:  Diagnosis Date  . Adenomatous colon polyp 06/05/11   Repeat 2018 per Dr. Carlean Purl  . Allergic angioedema    ACE inhibitor/beta blocker  . Diverticulosis   . Gastroenteritis 2012  . Headache(784.0)   . Hyperlipidemia   . Hypertension   . Influenza A 02/2011  . Intermittent vertigo   . Microhematuria 01/06/2012   Dr. Orland Mustard, urologist in [place], did cysto and bladder bx 06/24/12 and it was unremarkable  . Prostate cancer [facility]) 2007   Dr. Harlow Asa ([facility])  Pt is s/p brachytherapy 2011.  Jan 2015 PSA 0.6 ng/ml.  . 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 Caryl Comes)  . Syncope    ? Malignant vasovagal syncope-Pacer placed    Past Surgical History:  Procedure Laterality Date  . CATARACT EXTRACTION  1984, 1994   (lens implants bilat)  . COLONOSCOPY  06/05/11   46mm sigmoid polyp removed (+adenomatous), moderate diverticulosis, internal hemorrhoids.  Repeat 2018.  Marland Kitchen CYSTOSTOMY W/ BLADDER BIOPSY  06/24/12   Benign  . PACEMAKER INSERTION  02/2011   [employer]. [id number] Dual chamber; place in [place], [place] when pt visiting there for holiday 02/2011.  Marland Kitchen RADIOACTIVE SEED IMPLANT     prostate 07/2009.  PSA dropped to below 2 after this (Dr. Harlow Asa at [facility] in [facility])  . TONSILLECTOMY  1950  . TRANSTHORACIC ECHOCARDIOGRAM   02/2011   Normal    Current Outpatient Medications  Medication Sig Dispense Refill  . acetaminophen (TYLENOL) 325 MG tablet Take 500 mg by mouth as needed.     . Alfalfa 500 MG TABS 9-12 TABS/DAY     . aspirin EC 81 MG tablet Take 81 mg by mouth daily.      Marland Kitchen b complex vitamins tablet Take 1 tablet by mouth daily.    . carvedilol (COREG) 25 MG tablet Take 1 tablet (25 mg total) by mouth 2 (two) times daily with a meal. 180 tablet 0  . cholecalciferol (VITAMIN D) 1000 UNITS tablet Take 1,000 Units by mouth daily.     . cloNIDine (CATAPRES) 0.1 MG tablet Take 1 tablet (0.1 mg total) by mouth 2 (two) times daily. 180 tablet 1  . Flaxseed, Linseed, (FLAX SEED OIL PO) Take 1,000 mg by mouth 2 (two) times daily.    . metoCLOPramide (REGLAN) 10 MG tablet Take 1 tablet (10 mg total) by mouth every 6 (six) hours as needed for nausea (nausea/headache). 6 tablet 0  . NON FORMULARY Take 2 tablets by mouth daily. Prostate Ess plus    . NON FORMULARY Take 4 tablets by mouth daily. osteomatrix     . pravastatin (PRAVACHOL) 20 MG tablet Take 1 tablet (20 mg total) by mouth daily. 90 tablet 1  . vitamin C (ASCORBIC ACID) 500 MG tablet Take 500 mg by mouth as needed.  No current facility-administered medications for this visit.     Allergies  Allergen Reactions  . Ace Inhibitors Other (See Comments)    REACTION: Angioedema  . Aspirin Other (See Comments)    REACTION: Stomach Discomfort/TAKES 81 MG BUT CANT TAKE 325 MG ASA PER PT.  . Toprol Xl [Metoprolol Succinate] Other (See Comments)    Angioedema    Review of Systems negative except from HPI and PMH  Physical Exam BP (!) 160/80   Pulse 65   Ht 5\' 9"  (1.753 m)   Wt 170 lb (77.1 kg)   BMI 25.10 kg/m  Well developed and nourished in no acute distress HENT normal Neck supple with JVP-flat Clear Regular rate and rhythm, no murmurs or gallops Abd-soft with active BS No Clubbing cyanosis edema Skin-warm and dry A & Oriented  Grossly  normal sensory and motor function  ECG  Sinus 65 20/10/40   Assessment and  Plan  Sinus node dysfunction  Pacemaker The patient's device was interrogated.  The information was reviewed. No changes were made in the programming.    Hypertension--white coat   Orthostatic intolerance   BP is wiell controlled on two drugs and now is off amlodipine   No syncope/LH   HR excursion adequate and no symptoms of exercise intolerance

## 2017-03-13 NOTE — Progress Notes (Signed)
HEART 

## 2017-03-13 NOTE — Patient Instructions (Signed)
Medication Instructions: Your physician recommends that you continue on your current medications as directed. Please refer to the Current Medication list given to you today.  Labwork: None Ordered  Procedures/Testing: None Ordered  Follow-Up: Your physician wants you to follow-up in: 1 YEAR with Caryl Comes . You will receive a reminder letter in the mail two months in advance. If you don't receive a letter, please call our office to schedule the follow-up appointment.  Your physician wants you to follow-up in: 6 MONTHS with the [facility]   If you need a refill on your cardiac medications before your next appointment, please call your pharmacy.

## 2017-03-28 DIAGNOSIS — R69 Illness, unspecified: Secondary | ICD-10-CM | POA: Diagnosis not present

## 2017-04-22 ENCOUNTER — Other Ambulatory Visit: Payer: Self-pay | Admitting: Family Medicine

## 2017-05-15 ENCOUNTER — Emergency Department (HOSPITAL_BASED_OUTPATIENT_CLINIC_OR_DEPARTMENT_OTHER)
Admission: EM | Admit: 2017-05-15 | Discharge: 2017-05-15 | Disposition: A | Discharge status: Home / Self Care | Payer: Medicare HMO | Attending: Emergency Medicine | Admitting: Emergency Medicine

## 2017-05-15 ENCOUNTER — Ambulatory Visit: Payer: Self-pay | Admitting: *Deleted

## 2017-05-15 ENCOUNTER — Encounter (HOSPITAL_BASED_OUTPATIENT_CLINIC_OR_DEPARTMENT_OTHER): Payer: Self-pay | Admitting: Emergency Medicine

## 2017-05-15 ENCOUNTER — Other Ambulatory Visit: Payer: Self-pay

## 2017-05-15 DIAGNOSIS — I10 Essential (primary) hypertension: Secondary | ICD-10-CM | POA: Diagnosis present

## 2017-05-15 DIAGNOSIS — Z720 Tobacco use: Secondary | ICD-10-CM | POA: Insufficient documentation

## 2017-05-15 DIAGNOSIS — Z79899 Other long term (current) drug therapy: Secondary | ICD-10-CM | POA: Insufficient documentation

## 2017-05-15 DIAGNOSIS — I159 Secondary hypertension, unspecified: Secondary | ICD-10-CM | POA: Diagnosis not present

## 2017-05-15 DIAGNOSIS — Z8546 Personal history of malignant neoplasm of prostate: Secondary | ICD-10-CM | POA: Insufficient documentation

## 2017-05-15 DIAGNOSIS — Z950 Presence of cardiac pacemaker: Secondary | ICD-10-CM | POA: Diagnosis not present

## 2017-05-15 NOTE — ED Notes (Signed)
Family at bedside. 

## 2017-05-15 NOTE — ED Provider Notes (Signed)
[facility] EMERGENCY DEPARTMENT Provider Note  CSN: [id number] Arrival date & time: 05/15/17 1829  Chief Complaint(s) Hypertension  HPI Billy Burns is a 77 y.o. male   The history is provided by the patient.  Hypertension  This is a chronic problem. The problem occurs constantly. The problem has been gradually worsening. Pertinent negatives include no chest pain, no abdominal pain, no headaches and no shortness of breath. Exacerbated by: stress. Nothing relieves the symptoms. Treatments tried: coreg and clonidine. The treatment provided mild relief.   Last week BP 120/70s. Today BP 200/100.  Patient endorses increased stress and anxiety recently with several family issues.  States that he has been having frequent panic attacks which improve with long walks and prior.  Past Medical History Past Medical History:  Diagnosis Date  . Adenomatous colon polyp 06/05/11   Repeat 2018 per Dr. Carlean Purl  . Allergic angioedema    ACE inhibitor/beta blocker  . Diverticulosis   . Gastroenteritis 2012  . Headache(784.0)   . Hyperlipidemia   . Hypertension   . Influenza A 02/2011  . Intermittent vertigo   . Microhematuria 01/06/2012   Dr. Orland Mustard, urologist in [place], did cysto and bladder bx 06/24/12 and it was unremarkable  . Prostate cancer [facility]) 2007   Dr. Harlow Asa ([facility] in [facility])  Pt is s/p brachytherapy 2011.  Jan 2015 PSA 0.6 ng/ml.  . 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 Caryl Comes)  . Syncope    ? Malignant vasovagal syncope-Pacer placed   Patient Active Problem List   Diagnosis Date Noted  . Smoking 03/04/2013  . Hematuria 01/06/2012  . BPPV (benign paroxysmal positional vertigo) 08/06/2011  . Syncope   . Sinus arrest 04/09/2011  . Pacemaker-[place] 04/02/2011  . PROSTATE CANCER 11/09/2009  . Hyperlipidemia 11/14/2007  . Essential hypertension 10/25/2006   Home Medication(s) Prior to Admission medications   Medication Sig Start Date End  Date Taking? Authorizing Provider  acetaminophen (TYLENOL) 325 MG tablet Take 500 mg by mouth as needed.     [provider]  Alfalfa 500 MG TABS 9-12 TABS/DAY     [provider]  aspirin EC 81 MG tablet Take 81 mg by mouth daily.      [provider]  b complex vitamins tablet Take 1 tablet by mouth daily.    [provider]  carvedilol (COREG) 25 MG tablet Take 1 tablet (25 mg total) by mouth 2 (two) times daily with a meal. 01/14/17   Wendling, Crosby Oyster, DO  cholecalciferol (VITAMIN D) 1000 UNITS tablet Take 1,000 Units by mouth daily.     [provider]  cloNIDine (CATAPRES) 0.1 MG tablet Take 1 tablet (0.1 mg total) by mouth 2 (two) times daily. 01/14/17   Wendling, Crosby Oyster, DO  Flaxseed, Linseed, (FLAX SEED OIL PO) Take 1,000 mg by mouth 2 (two) times daily.    [provider]  metoCLOPramide (REGLAN) 10 MG tablet Take 1 tablet (10 mg total) by mouth every 6 (six) hours as needed for nausea (nausea/headache). 07/30/16   Orlie Dakin, MD  NON FORMULARY Take 2 tablets by mouth daily. Prostate Ess plus    [provider]  NON FORMULARY Take 4 tablets by mouth daily. osteomatrix     [provider]  pravastatin (PRAVACHOL) 20 MG tablet TAKE 1 TABLET BY MOUTH ONCE DAILY 04/22/17   Shelda Pal, DO  vitamin C (ASCORBIC ACID) 500 MG tablet Take 500 mg by mouth  as needed.     [provider]                                                                                                                                    Past Surgical History Past Surgical History:  Procedure Laterality Date  . CATARACT EXTRACTION  1984, 1994   (lens implants bilat)  . COLONOSCOPY  06/05/11   63mm sigmoid polyp removed (+adenomatous), moderate diverticulosis, internal hemorrhoids.  Repeat 2018.  Marland Kitchen CYSTOSTOMY W/ BLADDER BIOPSY  06/24/12   Benign  . PACEMAKER INSERTION  02/2011   [employer]. Q761 Dual  chamber; place in [place], [place] when pt visiting there for holiday 02/2011.  Marland Kitchen RADIOACTIVE SEED IMPLANT     prostate 07/2009.  PSA dropped to below 2 after this (Dr. Harlow Asa at [facility] in [facility])  . TONSILLECTOMY  1950  . TRANSTHORACIC ECHOCARDIOGRAM  02/2011   Normal   Family History Family History  Problem Relation Age of Onset  . Stroke Mother        during endarterectomy  . Colon cancer Father   . Diabetes Neg Hx     Social History Social History   Tobacco Use  . Smoking status: Former Smoker    Last attempt to quit: 03/26/1964    Years since quitting: 53.1  . Smokeless tobacco: Never Used  Substance Use Topics  . Alcohol use: No  . Drug use: No   Allergies Ace inhibitors; Aspirin; and Toprol xl [metoprolol succinate]  Review of Systems Review of Systems  Respiratory: Negative for shortness of breath.   Cardiovascular: Negative for chest pain.  Gastrointestinal: Negative for abdominal pain.  Neurological: Negative for headaches.   All other systems are reviewed and are negative for acute change except as noted in the HPI  Physical Exam Vital Signs  I have reviewed the triage vital signs BP (!) 198/98   Pulse 61   Temp 97.6 F (36.4 C) (Oral)   Resp 18   Ht 5\' 9"  (1.753 m)   Wt 77.1 kg (170 lb)   SpO2 100%   BMI 25.10 kg/m   Physical Exam  Constitutional: He is oriented to person, place, and time. He appears well-developed and well-nourished. No distress.  HENT:  Head: Normocephalic and atraumatic.  Nose: Nose normal.  Eyes: Conjunctivae and EOM are normal. Pupils are equal, round, and reactive to light. Right eye exhibits no discharge. Left eye exhibits no discharge. No scleral icterus.  Neck: Normal range of motion. Neck supple.  Cardiovascular: Normal rate and regular rhythm. Exam reveals no gallop and no friction rub.  No murmur heard. Pulmonary/Chest: Effort normal and breath sounds normal. No stridor. No respiratory distress. He has  no rales.  Abdominal: Soft. He exhibits no distension. There is no tenderness.  Musculoskeletal: He exhibits no edema or tenderness.  Neurological: He is alert and oriented to person, place, and time.  Skin: Skin is warm and dry. No rash noted. He is not diaphoretic. No erythema.  Psychiatric: He has a normal mood and affect.  Vitals reviewed.   ED Results and Treatments Labs (all labs ordered are listed, but only abnormal results are displayed) Labs Reviewed - No data to display                                                                                                                       EKG  EKG Interpretation  Date/Time:    Ventricular Rate:    PR Interval:    QRS Duration:   QT Interval:    QTC Calculation:   R Axis:     Text Interpretation:        Radiology No results found. Pertinent labs & imaging results that were available during my care of the patient were reviewed by me and considered in my medical decision making (see chart for details).  Medications Ordered in ED Medications - No data to display                                                                                                                                  Procedures Procedures  (including critical care time)  Medical Decision Making / ED Course I have reviewed the nursing notes for this encounter and the patient's prior records (if available in EHR or on provided paperwork).    Asymptomatic HTN.  Discussed the option for screening labs but patient declined.  He has close follow-up with PCP in 2 days.  The patient is safe for discharge with strict return precautions.   Final Clinical Impression(s) / ED Diagnoses Final diagnoses:  Secondary hypertension   Disposition: Discharge  Condition: Good  I have discussed the results, Dx and Tx plan with the patient who expressed understanding and agree(s) with the plan. Discharge instructions discussed at great length. The patient was  given strict return precautions who verbalized understanding of the instructions. No further questions at time of discharge.    ED Discharge Orders    None       Follow Up: [place], [place] [address] [phone]  On 05/17/2017 as scheduled      This chart was dictated using voice recognition software.  Despite best efforts to proofread,  errors can occur which can change the documentation meaning.   [place],  Grayce Sessions, MD 05/15/17 [phone]

## 2017-05-15 NOTE — ED Notes (Signed)
ED Provider at bedside. 

## 2017-05-15 NOTE — ED Triage Notes (Signed)
Patient states that he has had a couple of "anxiety attacks" today - He took his BP and it was elevated so he was told by his MD to come here because of the High Blood Pressure

## 2017-05-15 NOTE — Telephone Encounter (Signed)
Pt has  A  History of  Htn  Pacemaker.  Pt  Reports  He  Has  A  Sensation  Of  Feeling  Closed  In      Has  Had  Similar  Symptoms  Before  In past  Worse  Today. Pt  Denies  Any  Chest pain  Or  Shortness of breath .   Pt  Speaking in  Complete  sentances    Wife  Is at home  With him .  Denies  Any   Difficulty  Breathing .   Pt   Denies  Any   Blurred  Vision.  Pt  Reports   He  Has  Been  Going  For  [facility]  Frequently. Pt  Advised  With his  History  And  Hypertension he  Should  Go to  [facility]  [facility]  Is  Closed  Now.   Reason for Disposition . [9] Systolic BP  >= 211 OR Diastolic >= 941  AND [7] having NO cardiac or neurologic symptoms . [4] Systolic BP  >= 081 OR Diastolic >= 448 AND [1] cardiac or neurologic symptoms (e.g., chest pain, difficulty breathing, unsteady gait, blurred vision)  Answer Assessment - Initial Assessment Questions 1. BLOOD PRESSURE: "What is the blood pressure?" "Did you take at least two measurements 5 minutes apart?"     196/90    andn 5  mins  Later  Was   193/11    Pulse  Is  72   2. ONSET: "When did you take your blood pressure?"      1635   3. HOW: "How did you obtain the blood pressure?" (e.g., visiting nurse, automatic home BP monitor)     Home Bp  Machine   4. HISTORY: "Do you have a history of high blood pressure?"       YES  5. MEDICATIONS: "Are you taking any medications for blood pressure?" "Have you missed any doses recently?"     YES         -      Has  Not Missed  Any  Doses     6. OTHER SYMPTOMS: "Do you have any symptoms?" (e.g., headache, chest pain, blurred vision, difficulty breathing, weakness)        ANXIOIUS     7. PREGNANCY: "Is there any chance you are pregnant?" "When was your last menstrual period?"     *No Answer*  Protocols used: HIGH BLOOD PRESSURE-A-AH

## 2017-05-15 NOTE — ED Notes (Signed)
Pt discharged to home with family. NAD.  

## 2017-05-15 NOTE — Telephone Encounter (Signed)
FYI

## 2017-05-16 ENCOUNTER — Ambulatory Visit: Payer: Medicare HMO | Admitting: Family Medicine

## 2017-05-20 ENCOUNTER — Encounter: Payer: Self-pay | Admitting: Family Medicine

## 2017-05-20 ENCOUNTER — Ambulatory Visit (INDEPENDENT_AMBULATORY_CARE_PROVIDER_SITE_OTHER): Payer: Medicare HMO | Admitting: Family Medicine

## 2017-05-20 VITALS — BP 132/80 | HR 63 | Temp 98.1°F | Ht 69.0 in | Wt 170.2 lb

## 2017-05-20 DIAGNOSIS — F410 Panic disorder [episodic paroxysmal anxiety] without agoraphobia: Secondary | ICD-10-CM | POA: Diagnosis not present

## 2017-05-20 DIAGNOSIS — R69 Illness, unspecified: Secondary | ICD-10-CM | POA: Diagnosis not present

## 2017-05-20 NOTE — Progress Notes (Signed)
Chief Complaint  Patient presents with  . Panic Attack   Subjective Billy Burns is an 77 y.o. male who presents with anxiety/panic attacks.  The patient is self described as having a short fuse and having had panic attacks in the past.  1 week ago, his daughter told him that she lost her job.  This caused him a significant amount of stress, not only bc it is his daughter, but also because he is currently living with her.  He started having panic attacks that he could not control easily.  He went to the emergency department and metabolic causes were ruled out.  In the past when he has panic attacks, he would usually walk around or pray.  This would eliminate the problem.  He has never been on any medicine for this issue.  He does not follow with a counselor or psychologist.  He does have a good support system through his church and his wife.  He also finds that 1 of his daughters cats, some down.  No suicidal or homicidal ideation.  No self medication.  He does not want to go on anything that is addictive.  Past Medical History:  Diagnosis Date  . Adenomatous colon polyp 06/05/11   Repeat 2018 per Dr. Carlean Purl  . Allergic angioedema    ACE inhibitor/beta blocker  . Diverticulosis   . Gastroenteritis 2012  . Headache(784.0)   . Hyperlipidemia   . Hypertension   . Influenza A 02/2011  . Intermittent vertigo   . Microhematuria 01/06/2012   Dr. Orland Mustard, urologist in [place], did cysto and bladder bx 06/24/12 and it was unremarkable  . Prostate cancer [facility]) 2007   Dr. Harlow Asa ([facility] in [facility])  Pt is s/p brachytherapy 2011.  Jan 2015 PSA 0.6 ng/ml.  . 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 Caryl Comes)  . Syncope    ? Malignant vasovagal syncope-Pacer placed    Medications Current Outpatient Medications on File Prior to Visit  Medication Sig Dispense Refill  . acetaminophen (TYLENOL) 325 MG tablet Take 500 mg by mouth as needed.     . Alfalfa 500 MG TABS 9-12 TABS/DAY      . aspirin EC 81 MG tablet Take 81 mg by mouth daily.      Marland Kitchen b complex vitamins tablet Take 1 tablet by mouth daily.    . carvedilol (COREG) 25 MG tablet Take 1 tablet (25 mg total) by mouth 2 (two) times daily with a meal. 180 tablet 0  . cholecalciferol (VITAMIN D) 1000 UNITS tablet Take 1,000 Units by mouth daily.     . cloNIDine (CATAPRES) 0.1 MG tablet Take 1 tablet (0.1 mg total) by mouth 2 (two) times daily. 180 tablet 1  . Flaxseed, Linseed, (FLAX SEED OIL PO) Take 1,000 mg by mouth 2 (two) times daily.    . metoCLOPramide (REGLAN) 10 MG tablet Take 1 tablet (10 mg total) by mouth every 6 (six) hours as needed for nausea (nausea/headache). 6 tablet 0  . NON FORMULARY Take 2 tablets by mouth daily. Prostate Ess plus    . NON FORMULARY Take 4 tablets by mouth daily. osteomatrix     . pravastatin (PRAVACHOL) 20 MG tablet TAKE 1 TABLET BY MOUTH ONCE DAILY 90 tablet 1  . vitamin C (ASCORBIC ACID) 500 MG tablet Take 500 mg by mouth as needed.       Allergies Allergies  Allergen Reactions  . Ace Inhibitors Other (See Comments)  REACTION: Angioedema  . Aspirin Other (See Comments)    REACTION: Stomach Discomfort/TAKES 81 MG BUT CANT TAKE 325 MG ASA PER PT.  Marland Kitchen Toprol Xl [Metoprolol Succinate] Other (See Comments)    Angioedema    Family History Family History  Problem Relation Age of Onset  . Stroke Mother        during endarterectomy  . Colon cancer Father   . Diabetes Neg Hx      Review Of Systems Cardiovascular:  no chest pain, no palpitations Psychiatric: as noted in HPI  Exam BP 132/80 (BP Location: Left Arm, Patient Position: Sitting, Cuff Size: Normal)   Pulse 63   Temp 98.1 F (36.7 C) (Oral)   Ht 5\' 9"  (1.753 m)   Wt 170 lb 4 oz (77.2 kg)   SpO2 97%   BMI 25.14 kg/m  General:  well developed, well nourished, in no apparent distress Lungs:  clear to auscultation, breath sounds equal bilaterally, normal respiratory effort without accessory muscle  use Cardio:  regular rate and rhythm without murmurs Psych: well oriented with normal range of affect and age-appropriate judgement/insight  Assessment and Plan  Panic disorder (episodic paroxysmal anxiety)  He was given the option of watchful waiting, counseling, or medication.  He wishes to have the information for counseling and receive stress/anxiety coping mechanisms.  As he has improved since last week, he would like to hold off on any medicine at this time.  If this changes, he will let us know. Counseled on adjunctive treatment with exercise/physical activity. Number for [place] provided in AVS. F/u prn at this point for this issue, I will see him twice yearly for med checks. Patient voiced understanding and agreement to the plan.  Greater than 25 minutes were spent face to face with the patient with greater than 50% of this time spent counseling on anxiety/panic attack diagnosis, treatment options, and recommendations moving forward.   [place], DO 05/20/17 7:39 AM

## 2017-05-20 NOTE — Patient Instructions (Signed)
Please consider counseling. Contact [phone] to schedule an appointment or inquire about cost/insurance coverage.  Coping skills Choose 5 that work for you:  Take a deep breath  Count to 20  Read a book  Do a puzzle  Meditate  Bake  Sing  Knit  Garden  Pray  Go outside  Call a friend  Listen to music  Take a walk  Color  Send a note  Take a bath  Watch a movie  Be alone in a quiet place  Pet an animal  Visit a friend  Journal  Exercise  Stretch   Let us know if anything changes.

## 2017-05-20 NOTE — Progress Notes (Signed)
Pre visit review using our clinic review tool, if applicable. No additional management support is needed unless otherwise documented below in the visit note. 

## 2017-05-30 DIAGNOSIS — R69 Illness, unspecified: Secondary | ICD-10-CM | POA: Diagnosis not present

## 2017-07-12 ENCOUNTER — Other Ambulatory Visit: Payer: Self-pay | Admitting: Family Medicine

## 2017-09-23 ENCOUNTER — Ambulatory Visit (INDEPENDENT_AMBULATORY_CARE_PROVIDER_SITE_OTHER): Payer: Medicare HMO | Admitting: *Deleted

## 2017-09-23 DIAGNOSIS — I495 Sick sinus syndrome: Secondary | ICD-10-CM

## 2017-09-23 LAB — CUP PACEART INCLINIC DEVICE CHECK
Implantable Lead Implant Date: 20121227
Implantable Lead Implant Date: 20121227
Implantable Lead Location: 753859
Implantable Lead Model: 4469
Implantable Lead Serial Number: 502103
Implantable Lead Serial Number: 552434
Lead Channel Pacing Threshold Amplitude: 0.9 V
Lead Channel Pacing Threshold Amplitude: 1.4 V
Lead Channel Pacing Threshold Pulse Width: 0.4 ms
Lead Channel Sensing Intrinsic Amplitude: 21.6 mV
Lead Channel Setting Pacing Amplitude: 2 V
Lead Channel Setting Pacing Amplitude: 2.4 V
Lead Channel Setting Pacing Pulse Width: 0.4 ms
Lead Channel Setting Sensing Sensitivity: 2.5 mV
MDC IDC LEAD LOCATION: 753859
MDC IDC MSMT LEADCHNL RA IMPEDANCE VALUE: 454 Ohm
MDC IDC MSMT LEADCHNL RA PACING THRESHOLD PULSEWIDTH: 0.4 ms
MDC IDC MSMT LEADCHNL RA SENSING INTR AMPL: 7.8 mV
MDC IDC MSMT LEADCHNL RV IMPEDANCE VALUE: 504 Ohm
MDC IDC PG IMPLANT DT: 20121227
MDC IDC PG SERIAL: 120835
MDC IDC SESS DTM: 20190701040000

## 2017-09-23 NOTE — Progress Notes (Signed)
Pacemaker check in clinic. Normal device function. Thresholds, sensing, impedances consistent with previous measurements. Device programmed to maximize longevity. 2 NSVT (1) EGM likely AT. Device programmed at appropriate safety margins. Histogram distribution appropriate for patient activity level. Device programmed to optimize intrinsic conduction. Estimated longevity 4.5 years. ROV w/ SK 6 months. Patient education completed.

## 2017-10-01 DIAGNOSIS — R69 Illness, unspecified: Secondary | ICD-10-CM | POA: Diagnosis not present

## 2017-10-08 ENCOUNTER — Other Ambulatory Visit: Payer: Self-pay | Admitting: Family Medicine

## 2018-01-06 ENCOUNTER — Other Ambulatory Visit: Payer: Self-pay | Admitting: Family Medicine

## 2018-01-08 ENCOUNTER — Other Ambulatory Visit: Payer: Self-pay | Admitting: Family Medicine

## 2018-02-12 ENCOUNTER — Encounter: Payer: Self-pay | Admitting: Family Medicine

## 2018-02-12 ENCOUNTER — Ambulatory Visit (INDEPENDENT_AMBULATORY_CARE_PROVIDER_SITE_OTHER): Payer: Medicare HMO | Admitting: Family Medicine

## 2018-02-12 VITALS — BP 142/84 | HR 74 | Temp 98.1°F | Ht 69.0 in | Wt 161.1 lb

## 2018-02-12 DIAGNOSIS — Z0001 Encounter for general adult medical examination with abnormal findings: Secondary | ICD-10-CM

## 2018-02-12 DIAGNOSIS — Z23 Encounter for immunization: Secondary | ICD-10-CM

## 2018-02-12 DIAGNOSIS — L570 Actinic keratosis: Secondary | ICD-10-CM

## 2018-02-12 DIAGNOSIS — Z0000 Encounter for general adult medical examination without abnormal findings: Secondary | ICD-10-CM

## 2018-02-12 NOTE — Progress Notes (Signed)
Chief Complaint  Patient presents with  . Annual Exam    Well Male Billy Burns is here for a complete physical.   His last physical was >1 year ago.  Current diet: in general, a "healthy" diet.   Current exercise: walking, some wt lifting, has been lifting boxes w recent move Weight trend: stable Daytime fatigue? Getting more fatigued at end of day. Seat belt? Yes.    Health maintenance Tetanus- Yes Pneumonia vaccine- Yes  Past Medical History:  Diagnosis Date  . Adenomatous colon polyp 06/05/11   Repeat 2018 per Dr. Carlean Purl  . Allergic angioedema    ACE inhibitor/beta blocker  . Diverticulosis   . Gastroenteritis 2012  . Headache(784.0)   . Hyperlipidemia   . Hypertension   . Influenza A 02/2011  . Intermittent vertigo   . Microhematuria 01/06/2012   Dr. Orland Mustard, urologist in [place], did cysto and bladder bx 06/24/12 and it was unremarkable  . Prostate cancer [facility]) 2007   Dr. Harlow Asa ([facility])  Pt is s/p brachytherapy 2011.  Jan 2015 PSA 0.6 ng/ml.  . 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 Caryl Comes)  . Syncope    ? Malignant vasovagal syncope-Pacer placed     Past Surgical History:  Procedure Laterality Date  . CATARACT EXTRACTION  1984, 1994   (lens implants bilat)  . COLONOSCOPY  06/05/11   10mm sigmoid polyp removed (+adenomatous), moderate diverticulosis, internal hemorrhoids.  Repeat 2018.  Marland Kitchen CYSTOSTOMY W/ BLADDER BIOPSY  06/24/12   Benign  . PACEMAKER INSERTION  02/2011   [employer]. [id number] Dual chamber; place in [place], [place] when pt visiting there for holiday 02/2011.  Marland Kitchen RADIOACTIVE SEED IMPLANT     prostate 07/2009.  PSA dropped to below 2 after this (Dr. Harlow Asa at [facility] in [facility])  . TONSILLECTOMY  1950  . TRANSTHORACIC ECHOCARDIOGRAM  02/2011   Normal    Medications  Current Outpatient Medications on File Prior to Visit  Medication Sig Dispense Refill  . acetaminophen (TYLENOL) 325 MG tablet Take 500  mg by mouth as needed.     . Alfalfa 500 MG TABS 9-12 TABS/DAY     . aspirin EC 81 MG tablet Take 81 mg by mouth daily.      Marland Kitchen b complex vitamins tablet Take 1 tablet by mouth daily.    . carvedilol (COREG) 25 MG tablet TAKE 1 TABLET BY MOUTH TWICE DAILY WITH MEALS 180 tablet 0  . cholecalciferol (VITAMIN D) 1000 UNITS tablet Take 1,000 Units by mouth daily.     . cloNIDine (CATAPRES) 0.1 MG tablet TAKE 1 TABLET BY MOUTH TWICE DAILY 180 tablet 1  . Flaxseed, Linseed, (FLAX SEED OIL PO) Take 1,000 mg by mouth 2 (two) times daily.    . NON FORMULARY Take 2 tablets by mouth daily. Prostate Ess plus    . NON FORMULARY Take 4 tablets by mouth daily. osteomatrix     . pravastatin (PRAVACHOL) 20 MG tablet TAKE 1 TABLET BY MOUTH ONCE DAILY 90 tablet 1  . vitamin C (ASCORBIC ACID) 500 MG tablet Take 500 mg by mouth as needed.      Allergies Allergies  Allergen Reactions  . Ace Inhibitors Other (See Comments)    REACTION: Angioedema  . Aspirin Other (See Comments)    REACTION: Stomach Discomfort/TAKES 81 MG BUT CANT TAKE 325 MG ASA PER PT.  . Toprol Xl [Metoprolol Succinate] Other (See Comments)    Angioedema  Family History Family History  Problem Relation Age of Onset  . Stroke Mother        during endarterectomy  . Colon cancer Father   . Diabetes Neg Hx     Review of Systems: Constitutional:  no fevers or chills Eye:  no recent significant change in vision Ear/Nose/Mouth/Throat:  Ears:  no recent hearing loss Nose/Mouth/Throat:  no complaints of nasal congestion or sore throat Cardiovascular:  no chest pain, no palpitations Respiratory:  no cough and no shortness of breath Gastrointestinal:  no abdominal pain, no change in bowel habits GU:  Male: negative for dysuria, frequency, and incontinence and negative for prostate symptoms Musculoskeletal/Extremities: +b/l hand pain; otherwise no pain, redness, or swelling of the joints Integumentary (Skin): +lesion on L ear; otherwise  no abnormal skin lesions reported Neurologic:  no headaches, Endocrine:  No unexpected weight changes Hematologic/Lymphatic:  no areas of easy bruising  Exam BP (!) 142/84 (BP Location: Left Arm, Patient Position: Sitting, Cuff Size: Normal)   Pulse 74   Temp 98.1 F (36.7 C) (Oral)   Ht 5\' 9"  (1.753 m)   Wt 161 lb 2 oz (73.1 kg)   SpO2 97%   BMI 23.79 kg/m  General:  well developed, well nourished, in no apparent distress Skin: See below; otherwise no significant moles, warts, or growths Head:  no masses, lesions, or tenderness Eyes:  pupils equal and round, sclera anicteric without injection Ears:  canals without lesions, TMs shiny without retraction, no obvious effusion, no erythema Nose:  nares patent, septum midline, mucosa normal Throat/Pharynx:  lips and gingiva without lesion; tongue and uvula midline; non-inflamed pharynx; no exudates or postnasal drainage Neck: neck supple without adenopathy, thyromegaly, or masses Lungs:  clear to auscultation, breath sounds equal bilaterally, no respiratory distress Cardio:  regular rate and rhythm, no LE edema or bruits Abdomen:  abdomen soft, nontender; bowel sounds normal; no masses or organomegaly Rectal: Deferred Musculoskeletal:  symmetrical muscle groups noted without atrophy or deformity Extremities:  no clubbing, cyanosis, or edema, no deformities, no skin discoloration Neuro:  gait normal; deep tendon reflexes normal and symmetric Psych: well oriented with normal range of affect and appropriate judgment/insight    Procedure note: cryotherapy Verbal consent obtained 1 skin lesion treated Liquid nitrogen was applied via a thin spray creating an ice ball with 1-2 mm corona surrounding the lesion The patient tolerated the procedure well There were no immediate complications noted   Assessment and Plan  Well adult exam - Plan: Comprehensive metabolic panel, Lipid panel  Need for influenza vaccination - Plan: Flu vaccine  HIGH DOSE PF (Fluzone High dose)  Actinic keratosis - Plan: PR DESTRUCTION BENIGN LESIONS UP TO 71   Well 77 y.o. male. Counseled on diet and exercise. Other orders as above. Follow up in 4 weeks if no improvement and we will perform shave biopsy, cancel appt and r/s to 6 mo if it does improve.  The patient voiced understanding and agreement to the plan.  [place], DO 02/12/18 4:28 PM

## 2018-02-12 NOTE — Progress Notes (Signed)
Pre visit review using our clinic review tool, if applicable. No additional management support is needed unless otherwise documented below in the visit note. 

## 2018-02-12 NOTE — Patient Instructions (Addendum)
Give [place] 2-3 business days to get the results of your labs back.   Because your blood pressure is well-controlled, you no longer have to check your blood pressure at home anymore unless you wish. Some people check it twice daily every day and some people stop altogether. Either or anything in between is fine. Strong work!  Keep the diet clean and stay active.  The new Shingrix vaccine (for shingles) is a 2 shot series. It can make people feel low energy, achy and almost like they have the flu for 48 hours after injection. Please plan accordingly when deciding on when to get this shot. Call your pharmacy for this. The second shot of the series is less severe regarding the side effects, but it still lasts 48 hours.   If your skin gets better, cancel appointment and reschedule for your 6 mo visit.   Let us know if you need anything.

## 2018-03-12 ENCOUNTER — Encounter: Payer: Self-pay | Admitting: Family Medicine

## 2018-03-12 ENCOUNTER — Ambulatory Visit (INDEPENDENT_AMBULATORY_CARE_PROVIDER_SITE_OTHER): Payer: Medicare HMO | Admitting: Family Medicine

## 2018-03-12 DIAGNOSIS — C44219 Basal cell carcinoma of skin of left ear and external auricular canal: Secondary | ICD-10-CM

## 2018-03-12 DIAGNOSIS — L989 Disorder of the skin and subcutaneous tissue, unspecified: Secondary | ICD-10-CM

## 2018-03-12 NOTE — Progress Notes (Signed)
Chief Complaint  Patient presents with  . Procedure   Here for shave biopsy of ear. Froze, still there a little.   Gen- awake Skin- L ear with scaly and slightly raised lesion along outer border. 0.5 cm x 0.3 cm in dimension. Psych- age approp judgment and insight  Procedure note; shave biopsy Informed consent was obtained. The area was cleaned with alcohol and injected with 0.5 mL of 1% lidocaine without epinephrine. A Dermablade was slightly bent and used to cut under the area of interest. The specimen was placed in a sterile specimen cup and sent to the lab. The area was then cauterized ensuring adequate hemostasis. The area was dressed with triple antibiotic ointment. There were no complications noted. The patient tolerated the procedure well.  Skin lesion - Plan: Dermatology pathology([facility]), PR SHAV SKIN LES <0.5 CM FACE,FACIAL  Orders as above. R/o scc/bcc.  Aftercare instructions provided along with warning s/s's. F/u as originally scheduled. Pt voiced understanding and agreement to plan.  Billy Burns 4:24 PM 03/12/18

## 2018-03-12 NOTE — Patient Instructions (Signed)
Do not shower for the rest of the day. When you do wash it, use only soap and water. Do not vigorously scrub. Apply triple antibiotic ointment (like Neosporin) twice daily. Keep the area clean and dry.   Things to look out for: increasing pain not relieved by ibuprofen/acetaminophen, fevers, spreading redness, drainage of pus, or foul odor.  Give [place] 1 business week (a little longer given the holidays) to get the results of your biopsy back.   Let us know if you need anything.

## 2018-03-17 ENCOUNTER — Other Ambulatory Visit: Payer: Self-pay | Admitting: Family Medicine

## 2018-03-17 DIAGNOSIS — C44219 Basal cell carcinoma of skin of left ear and external auricular canal: Secondary | ICD-10-CM

## 2018-03-18 ENCOUNTER — Telehealth: Payer: Self-pay | Admitting: *Deleted

## 2018-03-18 NOTE — Telephone Encounter (Signed)
Received Dermatopathology Report results from [facility]; forwarded to provider/SLS 12/24

## 2018-03-20 ENCOUNTER — Encounter: Payer: Self-pay | Admitting: Internal Medicine

## 2018-03-20 ENCOUNTER — Ambulatory Visit: Payer: Medicare HMO | Admitting: Internal Medicine

## 2018-03-20 VITALS — BP 150/84 | HR 79 | Ht 69.0 in | Wt 165.0 lb

## 2018-03-20 DIAGNOSIS — Z950 Presence of cardiac pacemaker: Secondary | ICD-10-CM | POA: Diagnosis not present

## 2018-03-20 DIAGNOSIS — I10 Essential (primary) hypertension: Secondary | ICD-10-CM

## 2018-03-20 DIAGNOSIS — I495 Sick sinus syndrome: Secondary | ICD-10-CM

## 2018-03-20 NOTE — Patient Instructions (Addendum)
Medication Instructions:  Your physician recommends that you continue on your current medications as directed. Please refer to the Current Medication list given to you today.  Labwork: You will get lab work today:  BMP  Testing/Procedures: None ordered.  Follow-Up: Your physician wants you to follow-up in: one year with EP APP.   You will receive a reminder letter in the mail two months in advance. If you don't receive a letter, please call our office to schedule the follow-up appointment.  Remote monitoring is used to monitor your Pacemaker from home. This monitoring reduces the number of office visits required to check your device to one time per year. It allows [place] to keep an eye on the functioning of your device to ensure it is working properly. You are scheduled for a device check from home on 06/19/2018. You may send your transmission at any time that day. If you have a wireless device, the transmission will be sent automatically. After your physician reviews your transmission, you will receive a postcard with your next transmission date.  Any Other Special Instructions Will Be Listed Below (If Applicable).  If you need a refill on your cardiac medications before your next appointment, please call your pharmacy.

## 2018-03-20 NOTE — Progress Notes (Signed)
Patient Care Team: Shelda Pal, DO as PCP - General (Family Medicine) Puschinsky, Fransico Him., MD as Consulting Physician (Urology) Deboraha Sprang, MD (Cardiology) and   HPI  Billy Burns is a 77 y.o. 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  There have been no palpitations, lightheadedness or syncope.   Working on losing weight   Past Medical History:  Diagnosis Date  . Adenomatous colon polyp 06/05/11   Repeat 2018 per Dr. Carlean Purl  . Allergic angioedema    ACE inhibitor/beta blocker  . Diverticulosis   . Gastroenteritis 2012  . Headache(784.0)   . Hyperlipidemia   . Hypertension   . Influenza A 02/2011  . Intermittent vertigo   . Microhematuria 01/06/2012   Dr. Orland Mustard, urologist in [place], did cysto and bladder bx 06/24/12 and it was unremarkable  . Prostate cancer [facility]) 2007   Dr. Harlow Asa ([facility] [address])  Pt is s/p brachytherapy 2011.  Jan 2015 PSA 0.6 ng/ml.  . 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 Caryl Comes)  . Syncope    ? Malignant vasovagal syncope-Pacer placed    Past Surgical History:  Procedure Laterality Date  . CATARACT EXTRACTION  1984, 1994   (lens implants bilat)  . COLONOSCOPY  06/05/11   33mm sigmoid polyp removed (+adenomatous), moderate diverticulosis, internal hemorrhoids.  Repeat 2018.  Marland Kitchen CYSTOSTOMY W/ BLADDER BIOPSY  06/24/12   Benign  . PACEMAKER INSERTION  02/2011   [employer]. [id number] Dual chamber; place in [place], [place] when pt visiting there for holiday 02/2011.  Marland Kitchen RADIOACTIVE SEED IMPLANT     prostate 07/2009.  PSA dropped to below 2 after this (Dr. Harlow Asa at [facility] in [place])  . TONSILLECTOMY  1950  . TRANSTHORACIC ECHOCARDIOGRAM  02/2011   Normal     Current Outpatient Medications  Medication Sig Dispense Refill  . acetaminophen (TYLENOL) 325 MG tablet Take 500 mg by mouth as needed.     . Alfalfa 500 MG TABS 9-12 TABS/DAY     . b complex vitamins tablet Take 1 tablet by mouth daily.    . carvedilol (COREG) 25 MG tablet TAKE 1 TABLET BY MOUTH TWICE DAILY WITH MEALS 180 tablet 0  . cholecalciferol (VITAMIN D) 1000 UNITS tablet Take 1,000 Units by mouth daily.     . cloNIDine (CATAPRES) 0.1 MG tablet TAKE 1 TABLET BY MOUTH TWICE DAILY 180 tablet 1  . Flaxseed, Linseed, (FLAX SEED OIL PO) Take 1,000 mg by mouth 2 (two) times daily.    . NON FORMULARY Take 2 tablets by mouth daily. Prostate Ess plus    . NON FORMULARY Take 4 tablets by mouth daily. osteomatrix     . vitamin C (ASCORBIC ACID) 500 MG tablet Take 500 mg by mouth as needed.      No current facility-administered medications for this visit.     Allergies  Allergen Reactions  . Ace Inhibitors Other (See Comments)    REACTION: Angioedema  . Aspirin Other (See Comments)    REACTION: Stomach Discomfort/TAKES 81 MG BUT CANT TAKE 325 MG ASA PER PT.  . Toprol Xl [Metoprolol Succinate] Other (See Comments)    Angioedema  Review of Systems negative except from HPI and PMH  Physical Exam BP (!) 150/84   Pulse 79   Ht 5\' 9"  (1.753 m)   Wt 165 lb (74.8 kg)   SpO2 97%   BMI 24.37 kg/m  Well developed and nourished in no acute distress HENT normal Neck supple with JVP-flat Clear Regular rate and rhythm, no murmurs or gallops Abd-soft with active BS No Clubbing cyanosis edema Skin-warm and dry A & Oriented  Grossly normal sensory and motor function  ECG  Sinus @ 79 20/10/36    Assessment and  Plan  Sinus node dysfunction  Pacemaker The patient's device was interrogated.  The information was reviewed. No changes were made in the programming.     Hypertension--white coat   Orthostatic intolerance  Atrial fib-SCAF   SCAF present, but not of sufficient  duration to justify anticoagulation  BP still elevated  But better at home Will check BMET

## 2018-03-21 LAB — BASIC METABOLIC PANEL
BUN/Creatinine Ratio: 13 (ref 10–24)
BUN: 15 mg/dL (ref 8–27)
CALCIUM: 9.4 mg/dL (ref 8.6–10.2)
CHLORIDE: 102 mmol/L (ref 96–106)
CO2: 25 mmol/L (ref 20–29)
Creatinine, Ser: 1.14 mg/dL (ref 0.76–1.27)
GFR calc non Af Amer: 61 mL/min/{1.73_m2} (ref >59)
GFR, EST AFRICAN AMERICAN: 71 mL/min/{1.73_m2} (ref >59)
GLUCOSE: 98 mg/dL (ref 65–99)
Potassium: 4.2 mmol/L (ref 3.5–5.2)
Sodium: 140 mmol/L (ref 134–144)

## 2018-03-21 LAB — CUP PACEART INCLINIC DEVICE CHECK
Implantable Lead Implant Date: 20121227
Implantable Lead Location: 753859
Implantable Lead Location: 753859
Implantable Lead Model: 4469
Implantable Lead Model: 4471
Lead Channel Impedance Value: 453 Ohm
Lead Channel Impedance Value: 480 Ohm
Lead Channel Pacing Threshold Pulse Width: 0.4 ms
Lead Channel Pacing Threshold Pulse Width: 0.4 ms
Lead Channel Sensing Intrinsic Amplitude: 8.6 mV
MDC IDC LEAD IMPLANT DT: 20121227
MDC IDC LEAD SERIAL: 502103
MDC IDC LEAD SERIAL: 552434
MDC IDC MSMT LEADCHNL RA PACING THRESHOLD AMPLITUDE: 0.8 V
MDC IDC MSMT LEADCHNL RV PACING THRESHOLD AMPLITUDE: 1.4 V
MDC IDC MSMT LEADCHNL RV SENSING INTR AMPL: 25 mV — AB
MDC IDC PG IMPLANT DT: 20121227
MDC IDC PG SERIAL: 120835
MDC IDC SESS DTM: 20191226050000
MDC IDC SET LEADCHNL RA PACING AMPLITUDE: 2 V
MDC IDC SET LEADCHNL RV PACING AMPLITUDE: 2.4 V
MDC IDC SET LEADCHNL RV PACING PULSEWIDTH: 0.4 ms
MDC IDC SET LEADCHNL RV SENSING SENSITIVITY: 2.5 mV

## 2018-04-04 DIAGNOSIS — R69 Illness, unspecified: Secondary | ICD-10-CM | POA: Diagnosis not present

## 2018-04-08 ENCOUNTER — Other Ambulatory Visit: Payer: Self-pay | Admitting: Family Medicine

## 2018-04-09 DIAGNOSIS — R69 Illness, unspecified: Secondary | ICD-10-CM | POA: Diagnosis not present

## 2018-04-22 DIAGNOSIS — R69 Illness, unspecified: Secondary | ICD-10-CM | POA: Diagnosis not present

## 2018-06-19 ENCOUNTER — Encounter: Payer: Medicare HMO | Admitting: *Deleted

## 2018-06-19 ENCOUNTER — Other Ambulatory Visit: Payer: Self-pay

## 2018-06-20 ENCOUNTER — Telehealth: Payer: Self-pay

## 2018-06-20 NOTE — Telephone Encounter (Signed)
Left message for patient to remind of missed remote transmission.  

## 2018-06-24 ENCOUNTER — Encounter: Payer: Self-pay | Admitting: Cardiology

## 2018-07-03 LAB — CUP PACEART REMOTE DEVICE CHECK
Battery Remaining Longevity: 48 mo
Battery Remaining Percentage: 74 %
Brady Statistic RA Percent Paced: 25 %
Brady Statistic RV Percent Paced: 3 %
Date Time Interrogation Session: 20200409185700
Implantable Lead Implant Date: 20121227
Implantable Lead Implant Date: 20121227
Implantable Lead Location: 753859
Implantable Lead Location: 753859
Implantable Lead Model: 4469
Implantable Lead Model: 4471
Implantable Lead Serial Number: 502103
Implantable Lead Serial Number: 552434
Implantable Pulse Generator Implant Date: 20121227
Lead Channel Impedance Value: 430 Ohm
Lead Channel Impedance Value: 450 Ohm
Lead Channel Pacing Threshold Amplitude: 0.8 V
Lead Channel Pacing Threshold Pulse Width: 0.4 ms
Lead Channel Setting Pacing Amplitude: 2 V
Lead Channel Setting Pacing Amplitude: 2.6 V
Lead Channel Setting Pacing Pulse Width: 0.4 ms
Lead Channel Setting Sensing Sensitivity: 2.5 mV
Pulse Gen Serial Number: 120835

## 2018-07-04 ENCOUNTER — Other Ambulatory Visit: Payer: Self-pay

## 2018-07-04 ENCOUNTER — Ambulatory Visit (INDEPENDENT_AMBULATORY_CARE_PROVIDER_SITE_OTHER): Payer: Medicare HMO | Admitting: *Deleted

## 2018-07-04 DIAGNOSIS — I495 Sick sinus syndrome: Secondary | ICD-10-CM | POA: Diagnosis not present

## 2018-07-06 ENCOUNTER — Other Ambulatory Visit: Payer: Self-pay | Admitting: Family Medicine

## 2018-07-08 ENCOUNTER — Other Ambulatory Visit: Payer: Self-pay | Admitting: Family Medicine

## 2018-07-11 NOTE — Progress Notes (Signed)
Remote pacemaker transmission.   

## 2018-10-03 ENCOUNTER — Ambulatory Visit (INDEPENDENT_AMBULATORY_CARE_PROVIDER_SITE_OTHER): Payer: Medicare HMO | Admitting: *Deleted

## 2018-10-03 DIAGNOSIS — R55 Syncope and collapse: Secondary | ICD-10-CM

## 2018-10-03 DIAGNOSIS — I495 Sick sinus syndrome: Secondary | ICD-10-CM

## 2018-10-03 LAB — CUP PACEART REMOTE DEVICE CHECK
Battery Remaining Longevity: 42 mo
Battery Remaining Percentage: 67 %
Brady Statistic RA Percent Paced: 25 %
Brady Statistic RV Percent Paced: 3 %
Date Time Interrogation Session: 20200710073000
Implantable Lead Implant Date: 20121227
Implantable Lead Implant Date: 20121227
Implantable Lead Location: 753859
Implantable Lead Location: 753859
Implantable Lead Model: 4469
Implantable Lead Model: 4471
Implantable Lead Serial Number: 502103
Implantable Lead Serial Number: 552434
Implantable Pulse Generator Implant Date: 20121227
Lead Channel Impedance Value: 443 Ohm
Lead Channel Impedance Value: 449 Ohm
Lead Channel Pacing Threshold Amplitude: 0.8 V
Lead Channel Pacing Threshold Pulse Width: 0.4 ms
Lead Channel Setting Pacing Amplitude: 2 V
Lead Channel Setting Pacing Amplitude: 2.6 V
Lead Channel Setting Pacing Pulse Width: 0.4 ms
Lead Channel Setting Sensing Sensitivity: 2.5 mV
Pulse Gen Serial Number: 120835

## 2018-10-06 ENCOUNTER — Other Ambulatory Visit: Payer: Self-pay | Admitting: Family Medicine

## 2018-10-06 MED ORDER — CLONIDINE HCL 0.1 MG PO TABS: 0.1000 mg | ORAL_TABLET | Freq: Two times a day (BID) | ORAL | 0 refills | Status: DC

## 2018-10-06 MED ORDER — CARVEDILOL 25 MG PO TABS: 25.0000 mg | ORAL_TABLET | Freq: Two times a day (BID) | ORAL | 0 refills | Status: DC

## 2018-10-06 NOTE — Telephone Encounter (Signed)
Copied from [place] [id number]. Topic: Quick Communication - Rx Refill/Question >> Oct 06, 2018  3:23 PM Mcneil, Ja-Kwan wrote: Medication:  carvedilol (COREG) 25 MG tablet and cloNIDine (CATAPRES) 0.1 MG tablet -Pt requests 90 day supply Has the patient contacted their pharmacy? yes   Preferred Pharmacy (with phone number or street name): [place], [place] [phone] (Phone)  [phone] (Fax)  Agent: Please be advised that RX refills may take up to 3 business days. We ask that you follow-up with your pharmacy.

## 2018-10-06 NOTE — Telephone Encounter (Signed)
Refills sent

## 2018-10-07 NOTE — Progress Notes (Signed)
Remote pacemaker transmission.   

## 2018-10-10 DIAGNOSIS — R69 Illness, unspecified: Secondary | ICD-10-CM | POA: Diagnosis not present

## 2018-11-13 DIAGNOSIS — R69 Illness, unspecified: Secondary | ICD-10-CM | POA: Diagnosis not present

## 2018-12-30 DIAGNOSIS — R69 Illness, unspecified: Secondary | ICD-10-CM | POA: Diagnosis not present

## 2019-01-05 ENCOUNTER — Ambulatory Visit (INDEPENDENT_AMBULATORY_CARE_PROVIDER_SITE_OTHER): Payer: Medicare HMO | Admitting: *Deleted

## 2019-01-05 DIAGNOSIS — R55 Syncope and collapse: Secondary | ICD-10-CM

## 2019-01-05 LAB — CUP PACEART REMOTE DEVICE CHECK
Battery Remaining Longevity: 36 mo
Battery Remaining Percentage: 62 %
Brady Statistic RA Percent Paced: 25 %
Brady Statistic RV Percent Paced: 3 %
Date Time Interrogation Session: 20201012080800
Implantable Lead Implant Date: 20121227
Implantable Lead Implant Date: 20121227
Implantable Lead Location: 753859
Implantable Lead Location: 753859
Implantable Lead Model: 4469
Implantable Lead Model: 4471
Implantable Lead Serial Number: 502103
Implantable Lead Serial Number: 552434
Implantable Pulse Generator Implant Date: 20121227
Lead Channel Impedance Value: 447 Ohm
Lead Channel Impedance Value: 450 Ohm
Lead Channel Pacing Threshold Amplitude: 0.8 V
Lead Channel Pacing Threshold Pulse Width: 0.4 ms
Lead Channel Setting Pacing Amplitude: 2 V
Lead Channel Setting Pacing Amplitude: 2.6 V
Lead Channel Setting Pacing Pulse Width: 0.4 ms
Lead Channel Setting Sensing Sensitivity: 2.5 mV
Pulse Gen Serial Number: 120835

## 2019-01-08 ENCOUNTER — Other Ambulatory Visit: Payer: Self-pay | Admitting: Family Medicine

## 2019-01-08 NOTE — Telephone Encounter (Signed)
Pt is due for follow up

## 2019-01-12 ENCOUNTER — Encounter: Payer: Self-pay | Admitting: Family Medicine

## 2019-01-12 ENCOUNTER — Ambulatory Visit (INDEPENDENT_AMBULATORY_CARE_PROVIDER_SITE_OTHER): Payer: Medicare HMO | Admitting: Family Medicine

## 2019-01-12 ENCOUNTER — Other Ambulatory Visit: Payer: Self-pay

## 2019-01-12 VITALS — BP 132/86 | HR 68 | Temp 97.0°F | Ht 69.0 in | Wt 164.5 lb

## 2019-01-12 DIAGNOSIS — I10 Essential (primary) hypertension: Secondary | ICD-10-CM

## 2019-01-12 DIAGNOSIS — Z8546 Personal history of malignant neoplasm of prostate: Secondary | ICD-10-CM | POA: Diagnosis not present

## 2019-01-12 DIAGNOSIS — Z0000 Encounter for general adult medical examination without abnormal findings: Secondary | ICD-10-CM

## 2019-01-12 LAB — COMPREHENSIVE METABOLIC PANEL
ALT: 16 U/L (ref 0–53)
AST: 20 U/L (ref 0–37)
Albumin: 4.4 g/dL (ref 3.5–5.2)
Alkaline Phosphatase: 79 U/L (ref 39–117)
BUN: 11 mg/dL (ref 6–23)
CO2: 30 mEq/L (ref 19–32)
Calcium: 9.2 mg/dL (ref 8.4–10.5)
Chloride: 103 mEq/L (ref 96–112)
Creatinine, Ser: 0.93 mg/dL (ref 0.40–1.50)
GFR: 78.22 mL/min (ref >60.00)
Glucose, Bld: 92 mg/dL (ref 70–99)
Potassium: 4.1 mEq/L (ref 3.5–5.1)
Sodium: 139 mEq/L (ref 135–145)
Total Bilirubin: 0.6 mg/dL (ref 0.2–1.2)
Total Protein: 6.9 g/dL (ref 6.0–8.3)

## 2019-01-12 LAB — LIPID PANEL
Cholesterol: 157 mg/dL (ref 0–200)
HDL: 39.2 mg/dL (ref >39.00)
LDL Cholesterol: 87 mg/dL (ref 0–99)
NonHDL: 117.92
Total CHOL/HDL Ratio: 4
Triglycerides: 154 mg/dL — ABNORMAL HIGH (ref 0.0–149.0)
VLDL: 30.8 mg/dL (ref 0.0–40.0)

## 2019-01-12 LAB — PSA: PSA: 0.31 ng/mL (ref 0.10–4.00)

## 2019-01-12 NOTE — Progress Notes (Signed)
Chief Complaint  Patient presents with  . Follow-up    Well Male Billy Burns is here for a complete physical.   His last physical was >1 year ago.  Current diet: in general, a "healthy" diet.   Current exercise: walking Weight trend: stable Daytime fatigue? No. Seat belt? Yes.    HTN He does monitor home blood pressures. Blood pressures ranging from 110's/70's on average. He is compliant with medications- Coreg 25 mg bid, clonidine 0.1 mg bid. Patient has these side effects of medication: none He is adhering to a healthy diet overall. Current exercise: walking, wt resistance exercise  Health maintenance Tetanus- Yes Pneumonia vaccine- Yes  Past Medical History:  Diagnosis Date  . Adenomatous colon polyp 06/05/11   Repeat 2018 per Dr. Carlean Purl  . Allergic angioedema    ACE inhibitor/beta blocker  . Diverticulosis   . Gastroenteritis 2012  . Headache(784.0)   . Hyperlipidemia   . Hypertension   . Influenza A 02/2011  . Intermittent vertigo   . Microhematuria 01/06/2012   Dr. Orland Mustard, urologist in [place], did cysto and bladder bx 06/24/12 and it was unremarkable  . Prostate cancer [facility]) 2007   Dr. Harlow Asa ([facility])  Pt is s/p brachytherapy 2011.  Jan 2015 PSA 0.6 ng/ml.  . 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 Caryl Comes)  . Syncope    ? Malignant vasovagal syncope-Pacer placed     Past Surgical History:  Procedure Laterality Date  . CATARACT EXTRACTION  1984, 1994   (lens implants bilat)  . COLONOSCOPY  06/05/11   17mm sigmoid polyp removed (+adenomatous), moderate diverticulosis, internal hemorrhoids.  Repeat 2018.  Marland Kitchen CYSTOSTOMY W/ BLADDER BIOPSY  06/24/12   Benign  . PACEMAKER INSERTION  02/2011   [employer]. [id number] Dual chamber; place in [place], [place] when pt visiting there for holiday 02/2011.  Marland Kitchen RADIOACTIVE SEED IMPLANT     prostate 07/2009.  PSA dropped to below 2 after this (Dr. Harlow Asa at [facility])  .  TONSILLECTOMY  1950  . TRANSTHORACIC ECHOCARDIOGRAM  02/2011   Normal    Medications  Current Outpatient Medications on File Prior to Visit  Medication Sig Dispense Refill  . acetaminophen (TYLENOL) 325 MG tablet Take 500 mg by mouth as needed.     . Alfalfa 500 MG TABS 9-12 TABS/DAY     . b complex vitamins tablet Take 1 tablet by mouth daily.    . carvedilol (COREG) 25 MG tablet TAKE 1 TABLET BY MOUTH TWICE DAILY WITH MEALS 180 tablet 0  . cholecalciferol (VITAMIN D) 1000 UNITS tablet Take 1,000 Units by mouth daily.     . cloNIDine (CATAPRES) 0.1 MG tablet Take 1 tablet by mouth twice daily 180 tablet 0  . Flaxseed, Linseed, (FLAX SEED OIL PO) Take 1,000 mg by mouth 2 (two) times daily.    . NON FORMULARY Take 2 tablets by mouth daily. Prostate Ess plus    . NON FORMULARY Take 4 tablets by mouth daily. osteomatrix     . vitamin C (ASCORBIC ACID) 500 MG tablet Take 500 mg by mouth as needed.      Allergies Allergies  Allergen Reactions  . Ace Inhibitors Other (See Comments)    REACTION: Angioedema  . Aspirin Other (See Comments)    REACTION: Stomach Discomfort/TAKES 81 MG BUT CANT TAKE 325 MG ASA PER PT.  . Toprol Xl [Metoprolol Succinate] Other (See Comments)    Angioedema  Family History Family History  Problem Relation Age of Onset  . Stroke Mother        during endarterectomy  . Colon cancer Father   . Diabetes Neg Hx     Review of Systems: Constitutional:  no fevers or chills Eye:  no recent significant change in vision Ear/Nose/Mouth/Throat:  Ears:  no recent hearing loss Nose/Mouth/Throat:  no complaints of nasal congestion or sore throat Cardiovascular:  no chest pain, no palpitations Respiratory:  no cough and no shortness of breath Gastrointestinal:  no abdominal pain, no change in bowel habits GU:  Male: negative for dysuria, frequency, and incontinence and negative for prostate symptoms Musculoskeletal/Extremities:  no pain, redness, or swelling of the  joints Integumentary (Skin):  no abnormal skin lesions reported Neurologic:  no headaches, Endocrine:  No unexpected weight changes Hematologic/Lymphatic:  no areas of easy bruising  Exam BP 132/86 (BP Location: Left Arm, Patient Position: Sitting, Cuff Size: Normal)   Pulse 68   Temp (!) 97 F (36.1 C) (Temporal)   Ht 5\' 9"  (1.753 m)   Wt 164 lb 8 oz (74.6 kg)   SpO2 98%   BMI 24.29 kg/m  General:  well developed, well nourished, in no apparent distress Skin:  no significant moles, warts, or growths Head:  no masses, lesions, or tenderness Eyes:  pupils equal and round, sclera anicteric without injection Ears:  canals without lesions, TMs shiny without retraction, no obvious effusion, no erythema Nose:  nares patent, septum midline, mucosa normal Throat/Pharynx:  lips and gingiva without lesion; tongue and uvula midline; non-inflamed pharynx; no exudates or postnasal drainage Neck: neck supple without adenopathy, thyromegaly, or masses Lungs:  clear to auscultation, breath sounds equal bilaterally, no respiratory distress Cardio:  regular rate and rhythm, no LE edema or bruits Abdomen:  abdomen soft, nontender; bowel sounds normal; no masses or organomegaly Rectal: Deferred Musculoskeletal:  symmetrical muscle groups noted without atrophy or deformity Extremities:  no clubbing, cyanosis, or edema, no deformities, no skin discoloration Neuro:  gait normal; deep tendon reflexes normal and symmetric Psych: well oriented with normal range of affect and appropriate judgment/insight  Assessment and Plan  Well adult exam - Plan: Comprehensive metabolic panel, Lipid panel  Essential hypertension  History of prostate cancer - Plan: PSA   Well 78 y.o. male. Counseled on diet and exercise. Ck PSA given hx of prostate cancer.  Other orders as above. Follow up in 1 yr or prn.  The patient voiced understanding and agreement to the plan.  [place], DO 01/12/19 11:02  AM

## 2019-01-12 NOTE — Patient Instructions (Addendum)
Give [place] 2-3 business days to get the results of your labs back.   Keep the diet clean and stay active.  Aim to do some physical exertion for 150 minutes per week. This is typically divided into 5 days per week, 30 minutes per day. The activity should be enough to get your heart rate up. Anything is better than nothing if you have time constraints.  The new Shingrix vaccine (for shingles) is a 2 shot series. It can make people feel low energy, achy and almost like they have the flu for 48 hours after injection. Please plan accordingly when deciding on when to get this shot. Call your pharmacy to get this. The second shot of the series is less severe regarding the side effects, but it still lasts 48 hours.    Let us know if you need anything.

## 2019-01-15 NOTE — Progress Notes (Signed)
Remote pacemaker transmission.   

## 2019-03-23 ENCOUNTER — Encounter: Payer: Medicare HMO | Admitting: Physician Assistant

## 2019-03-24 NOTE — Progress Notes (Signed)
Cardiology Office Note Date:  03/26/2019  Patient ID:  Billy Burns, Billy Burns 1941/03/11, MRN [id number] PCP:  Shelda Pal, DO  Cardiologist:  Dr. Caryl Comes    Chief Complaint: annual visit  History of Present Illness: Billy Burns is a 78 y.o. 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  He comes in today to be seen for Dr. Caryl Comes.  Last seen by him Dec 2019, at that time doing well without symptoms.  He noted SCAF none of duration to warrant a/c.  Also mentioned white coat HTN, orthostatic intolerance.  He is doing well.  His wife is on dialysis and this worries him, worries about her health.  He denies any kind of cardiac awareness, no CP, no palpitations.  He has a bad back and sees his chiropractor regularly, has been able to keep walking regularly and feels like he has good exertional capacity.  Says he is always doing something, but also likes to do brain teaser puzzles to keep his mind sharp and this is also therapeutic for him when emotionally troubled.  He denies any dizzy spells, no near syncope or syncope.  He says over the years has learned to get up slower and avoid that issue.  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 Adenomatous colon polyp 06/05/11   Repeat 2018 per Dr. Carlean Purl  . Allergic angioedema    ACE inhibitor/beta blocker  . Diverticulosis   . Gastroenteritis 2012  . Headache(784.0)   . Hyperlipidemia   . Hypertension   . Influenza A 02/2011  . Intermittent vertigo   . Microhematuria 01/06/2012   Dr. Orland Mustard, urologist in [place], did cysto and bladder bx 06/24/12 and it was unremarkable  . Prostate cancer [facility]) 2007   Dr. Harlow Asa ([facility])  Pt is s/p brachytherapy 2011.  Jan 2015 PSA 0.6 ng/ml.  . 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 Caryl Comes)  . Syncope    ? Malignant vasovagal syncope-Pacer placed    Past Surgical History:  Procedure Laterality Date  . CATARACT EXTRACTION  1984, 1994   (lens implants bilat)  . COLONOSCOPY  06/05/11   92mm sigmoid polyp removed (+adenomatous), moderate diverticulosis, internal hemorrhoids.  Repeat 2018.  Marland Kitchen CYSTOSTOMY W/ BLADDER BIOPSY  06/24/12   Benign  . PACEMAKER INSERTION  02/2011   [employer]. [id number] Dual chamber; place in [place], [place] when pt visiting there for holiday 02/2011.  Marland Kitchen RADIOACTIVE SEED IMPLANT     prostate 07/2009.  PSA dropped to below 2 after this (Dr. Harlow Asa at [facility] in [facility])  . TONSILLECTOMY  1950  . TRANSTHORACIC ECHOCARDIOGRAM  02/2011   Normal    Current Outpatient Medications  Medication Sig Dispense Refill  . acetaminophen (TYLENOL) 325 MG tablet Take 500 mg by mouth as needed.     . Alfalfa 500 MG TABS 9-12 TABS/DAY     . b complex vitamins tablet Take 1 tablet by mouth daily.    . carvedilol (COREG) 25 MG tablet TAKE 1 TABLET BY MOUTH TWICE DAILY WITH MEALS 180 tablet 0  . cholecalciferol (VITAMIN D) 1000 UNITS tablet Take 1,000 Units by mouth daily.     . cloNIDine (CATAPRES) 0.1 MG tablet Take 1 tablet by mouth twice daily 180 tablet 0  .  Flaxseed, Linseed, (FLAX SEED OIL PO) Take 1,000 mg by mouth 2 (two) times daily.    . NON FORMULARY Take 2 tablets by mouth daily. Prostate Ess plus    . NON FORMULARY Take 4 tablets by mouth daily. osteomatrix     . vitamin C (ASCORBIC ACID) 500 MG tablet Take 500 mg by mouth as needed.      No current facility-administered medications for this visit.    Allergies:   Ace inhibitors, Aspirin, and Toprol xl [metoprolol succinate]   Social History:  The patient  reports that he quit smoking about 55 years ago. He has never used smokeless tobacco. He reports that he does not drink alcohol or use drugs.   Family History:  The patient's family history includes Colon cancer in  his father; Stroke in his mother.  ROS:  Please see the history of present illness.    All other systems are reviewed and otherwise negative.   PHYSICAL EXAM:  VS:  BP (!) 142/74   Pulse 73   Ht 5\' 9"  (1.753 m)   Wt 162 lb (73.5 kg)   BMI 23.92 kg/m  BMI: Body mass index is 23.92 kg/m. Well nourished, well developed, in no acute distress  HEENT: normocephalic, atraumatic  Neck: no JVD, carotid bruits or masses Cardiac: RRR; no significant murmurs, no rubs, or gallops Lungs:  CTA b/l, no wheezing, rhonchi or rales  Abd: soft, nontender MS: no deformity or atrophy Ext: no edema  Skin: warm and dry, no rash Neuro:  No gross deficits appreciated Psych: euthymic mood, full affect  PPM site is stable, no tethering or discomfort   EKG:  Done today and reviewed by myself shows  SR 73bpm  PPM interrogation done today and reviewed by myself:  Battery and lead measurements are good NSVT, one EGM for review, unable to see start, is a 1: of some kind., 5 seconds (no other NSVT EGMs for review) AFib episodes, longest 41 minuites Burden is <1%,   03/20/2011: TTE LVEF 60%, slight enlargement LA, no significant VHD   Recent Labs: 01/12/2019: ALT 16; BUN 11; Creatinine, Ser 0.93; Potassium 4.1; Sodium 139  01/12/2019: Cholesterol 157; HDL 39.20; LDL Cholesterol 87; Total CHOL/HDL Ratio 4; Triglycerides 154.0; VLDL 30.8   CrCl cannot be calculated (Patient's most recent lab result is older than the maximum 21 days allowed.).   Wt Readings from Last 3 Encounters:  03/26/19 162 lb (73.5 kg)  01/12/19 164 lb 8 oz (74.6 kg)  03/20/18 165 lb (74.8 kg)     Other studies reviewed: Additional studies/records reviewed today include: summarized above  ASSESSMENT AND PLAN:  1. PPM     Intact function, no programming changes made  2. HTN 3. Orthostatic intolerance     No ongoing dizziness     No changes today  4. SCAF     Noting of > 1hour duration, most only a couple minutes or  less    <1% burden    Discussed with the patient today, embolic/stroke risk, his burden of AF    Will follow for now, no a/c, he is comfortable with this    Should his burden increase, we will revisit a/c with him    Disposition: F/u with Q 3 mo remotes, in clinic in 6 months, sooner if needed  Current medicines are reviewed at length with the patient today.  The patient did not have any concerns regarding medicines.  Venetia Night, PA-C 03/26/2019 10:16 AM  [place] [phone] (office)  [phone] (fax)

## 2019-03-26 ENCOUNTER — Ambulatory Visit (INDEPENDENT_AMBULATORY_CARE_PROVIDER_SITE_OTHER): Payer: Medicare HMO | Admitting: Physician Assistant

## 2019-03-26 ENCOUNTER — Other Ambulatory Visit: Payer: Self-pay

## 2019-03-26 VITALS — BP 142/74 | HR 73 | Ht 69.0 in | Wt 162.0 lb

## 2019-03-26 DIAGNOSIS — I495 Sick sinus syndrome: Secondary | ICD-10-CM

## 2019-03-26 DIAGNOSIS — I10 Essential (primary) hypertension: Secondary | ICD-10-CM

## 2019-03-26 DIAGNOSIS — I480 Paroxysmal atrial fibrillation: Secondary | ICD-10-CM

## 2019-03-26 DIAGNOSIS — Z950 Presence of cardiac pacemaker: Secondary | ICD-10-CM | POA: Diagnosis not present

## 2019-03-26 NOTE — Patient Instructions (Signed)
Medication Instructions:  Your physician recommends that you continue on your current medications as directed. Please refer to the Current Medication list given to you today.  *If you need a refill on your cardiac medications before your next appointment, please call your pharmacy*  Lab Work: NONE ORDERED  TODAY   If you have labs (blood work) drawn today and your tests are completely normal, you will receive your results only by: . MyChart Message (if you have MyChart) OR . A paper copy in the mail If you have any lab test that is abnormal or we need to change your treatment, we will call you to review the results.  Testing/Procedures: NONE ORDERED  TODAY  Follow-Up: At [facility], you and your health needs are our priority.  As part of our continuing mission to provide you with exceptional heart care, we have created designated Provider Care Teams.  These Care Teams include your primary Cardiologist (physician) and Advanced Practice Providers (APPs -  Physician Assistants and Nurse Practitioners) who all work together to provide you with the care you need, when you need it.  Your next appointment:   6 month(s)  The format for your next appointment:   In Person  Provider:   You may see Dr. Klein  or one of the following Advanced Practice Providers on your designated Care Team:    Amber Seiler, NP  Renee Ursuy, PA-C  Michael "Andy" Tillery, PA-C   Other Instructions    

## 2019-04-06 ENCOUNTER — Other Ambulatory Visit: Payer: Self-pay | Admitting: Family Medicine

## 2019-04-06 ENCOUNTER — Ambulatory Visit (INDEPENDENT_AMBULATORY_CARE_PROVIDER_SITE_OTHER): Payer: Medicare HMO | Admitting: *Deleted

## 2019-04-06 DIAGNOSIS — R55 Syncope and collapse: Secondary | ICD-10-CM | POA: Diagnosis not present

## 2019-04-06 LAB — CUP PACEART REMOTE DEVICE CHECK
Battery Remaining Longevity: 30 mo
Battery Remaining Percentage: 53 %
Brady Statistic RA Percent Paced: 26 %
Brady Statistic RV Percent Paced: 4 %
Date Time Interrogation Session: 20210111033000
Implantable Lead Implant Date: 20121227
Implantable Lead Implant Date: 20121227
Implantable Lead Location: 753859
Implantable Lead Location: 753859
Implantable Lead Model: 4469
Implantable Lead Model: 4471
Implantable Lead Serial Number: 502103
Implantable Lead Serial Number: 552434
Implantable Pulse Generator Implant Date: 20121227
Lead Channel Impedance Value: 454 Ohm
Lead Channel Impedance Value: 472 Ohm
Lead Channel Pacing Threshold Amplitude: 0.8 V
Lead Channel Pacing Threshold Pulse Width: 0.4 ms
Lead Channel Setting Pacing Amplitude: 2 V
Lead Channel Setting Pacing Amplitude: 2.6 V
Lead Channel Setting Pacing Pulse Width: 0.4 ms
Lead Channel Setting Sensing Sensitivity: 2.5 mV
Pulse Gen Serial Number: 120835

## 2019-04-14 ENCOUNTER — Telehealth: Payer: Self-pay

## 2019-04-14 NOTE — Telephone Encounter (Signed)
LMOVM for pt to stop sending manual transmissions. His monitor is design to send automatically. I also left my direct office number in case the pt has any questions.

## 2019-05-10 ENCOUNTER — Encounter (HOSPITAL_BASED_OUTPATIENT_CLINIC_OR_DEPARTMENT_OTHER): Payer: Self-pay | Admitting: *Deleted

## 2019-05-10 ENCOUNTER — Emergency Department (HOSPITAL_COMMUNITY): Payer: Medicare HMO

## 2019-05-10 ENCOUNTER — Other Ambulatory Visit: Payer: Self-pay

## 2019-05-10 ENCOUNTER — Observation Stay (HOSPITAL_BASED_OUTPATIENT_CLINIC_OR_DEPARTMENT_OTHER)
Admission: EM | Admit: 2019-05-10 | Discharge: 2019-05-12 | Disposition: A | Discharge status: Home / Self Care | Payer: Medicare HMO | Attending: Internal Medicine | Admitting: Internal Medicine

## 2019-05-10 DIAGNOSIS — R42 Dizziness and giddiness: Principal | ICD-10-CM

## 2019-05-10 DIAGNOSIS — Z20822 Contact with and (suspected) exposure to covid-19: Secondary | ICD-10-CM | POA: Diagnosis not present

## 2019-05-10 DIAGNOSIS — Z8546 Personal history of malignant neoplasm of prostate: Secondary | ICD-10-CM | POA: Diagnosis not present

## 2019-05-10 DIAGNOSIS — R1111 Vomiting without nausea: Secondary | ICD-10-CM | POA: Diagnosis not present

## 2019-05-10 DIAGNOSIS — I495 Sick sinus syndrome: Secondary | ICD-10-CM | POA: Diagnosis not present

## 2019-05-10 DIAGNOSIS — I10 Essential (primary) hypertension: Secondary | ICD-10-CM | POA: Insufficient documentation

## 2019-05-10 DIAGNOSIS — I6621 Occlusion and stenosis of right posterior cerebral artery: Secondary | ICD-10-CM | POA: Diagnosis not present

## 2019-05-10 DIAGNOSIS — D72829 Elevated white blood cell count, unspecified: Secondary | ICD-10-CM

## 2019-05-10 DIAGNOSIS — R52 Pain, unspecified: Secondary | ICD-10-CM | POA: Diagnosis not present

## 2019-05-10 DIAGNOSIS — Z87891 Personal history of nicotine dependence: Secondary | ICD-10-CM | POA: Insufficient documentation

## 2019-05-10 DIAGNOSIS — E860 Dehydration: Secondary | ICD-10-CM

## 2019-05-10 DIAGNOSIS — R112 Nausea with vomiting, unspecified: Secondary | ICD-10-CM | POA: Diagnosis not present

## 2019-05-10 DIAGNOSIS — R1084 Generalized abdominal pain: Secondary | ICD-10-CM | POA: Diagnosis not present

## 2019-05-10 DIAGNOSIS — Z79899 Other long term (current) drug therapy: Secondary | ICD-10-CM | POA: Insufficient documentation

## 2019-05-10 DIAGNOSIS — Z950 Presence of cardiac pacemaker: Secondary | ICD-10-CM | POA: Diagnosis present

## 2019-05-10 DIAGNOSIS — I6523 Occlusion and stenosis of bilateral carotid arteries: Secondary | ICD-10-CM | POA: Diagnosis not present

## 2019-05-10 DIAGNOSIS — R110 Nausea: Secondary | ICD-10-CM | POA: Diagnosis not present

## 2019-05-10 DIAGNOSIS — R197 Diarrhea, unspecified: Secondary | ICD-10-CM | POA: Diagnosis not present

## 2019-05-10 DIAGNOSIS — E785 Hyperlipidemia, unspecified: Secondary | ICD-10-CM | POA: Diagnosis present

## 2019-05-10 LAB — CBC WITH DIFFERENTIAL/PLATELET
Abs Immature Granulocytes: 0.08 10*3/uL — ABNORMAL HIGH (ref 0.00–0.07)
Basophils Absolute: 0 10*3/uL (ref 0.0–0.1)
Basophils Relative: 0 %
Eosinophils Absolute: 0 10*3/uL (ref 0.0–0.5)
Eosinophils Relative: 0 %
HCT: 43.8 % (ref 39.0–52.0)
Hemoglobin: 14.6 g/dL (ref 13.0–17.0)
Immature Granulocytes: 1 %
Lymphocytes Relative: 6 %
Lymphs Abs: 0.9 10*3/uL (ref 0.7–4.0)
MCH: 29.9 pg (ref 26.0–34.0)
MCHC: 33.3 g/dL (ref 30.0–36.0)
MCV: 89.8 fL (ref 80.0–100.0)
Monocytes Absolute: 0.4 10*3/uL (ref 0.1–1.0)
Monocytes Relative: 3 %
Neutro Abs: 13 10*3/uL — ABNORMAL HIGH (ref 1.7–7.7)
Neutrophils Relative %: 90 %
Platelets: 241 10*3/uL (ref 150–400)
RBC: 4.88 MIL/uL (ref 4.22–5.81)
RDW: 13.3 % (ref 11.5–15.5)
WBC: 14.4 10*3/uL — ABNORMAL HIGH (ref 4.0–10.5)
nRBC: 0 % (ref 0.0–0.2)

## 2019-05-10 LAB — COMPREHENSIVE METABOLIC PANEL
ALT: 18 U/L (ref 0–44)
AST: 23 U/L (ref 15–41)
Albumin: 4.2 g/dL (ref 3.5–5.0)
Alkaline Phosphatase: 75 U/L (ref 38–126)
Anion gap: 9 (ref 5–15)
BUN: 14 mg/dL (ref 8–23)
CO2: 25 mmol/L (ref 22–32)
Calcium: 9 mg/dL (ref 8.9–10.3)
Chloride: 106 mmol/L (ref 98–111)
Creatinine, Ser: 0.85 mg/dL (ref 0.61–1.24)
GFR calc Af Amer: >60 mL/min (ref >60)
GFR calc non Af Amer: >60 mL/min (ref >60)
Glucose, Bld: 136 mg/dL — ABNORMAL HIGH (ref 70–99)
Potassium: 3.7 mmol/L (ref 3.5–5.1)
Sodium: 140 mmol/L (ref 135–145)
Total Bilirubin: 1.1 mg/dL (ref 0.3–1.2)
Total Protein: 7.3 g/dL (ref 6.5–8.1)

## 2019-05-10 LAB — URINALYSIS, ROUTINE W REFLEX MICROSCOPIC
Bilirubin Urine: NEGATIVE
Glucose, UA: NEGATIVE mg/dL
Ketones, ur: 15 mg/dL — AB
Leukocytes,Ua: NEGATIVE
Nitrite: NEGATIVE
Protein, ur: NEGATIVE mg/dL
Specific Gravity, Urine: 1.02 (ref 1.005–1.030)
pH: 8.5 — ABNORMAL HIGH (ref 5.0–8.0)

## 2019-05-10 LAB — URINALYSIS, MICROSCOPIC (REFLEX)

## 2019-05-10 LAB — LIPASE, BLOOD: Lipase: 20 U/L (ref 11–51)

## 2019-05-10 LAB — RESPIRATORY PANEL BY RT PCR (FLU A&B, COVID)
Influenza A by PCR: NEGATIVE
Influenza B by PCR: NEGATIVE
SARS Coronavirus 2 by RT PCR: NEGATIVE

## 2019-05-10 MED ORDER — PROMETHAZINE HCL 25 MG/ML IJ SOLN
INTRAMUSCULAR | Status: AC
  Filled 2019-05-10: qty 1

## 2019-05-10 MED ORDER — DIAZEPAM 5 MG/ML IJ SOLN
2.5000 mg | Freq: Once | INTRAMUSCULAR | Status: AC
  Administered 2019-05-10: 18:00:00 2.5 mg via INTRAVENOUS
  Filled 2019-05-10: qty 2

## 2019-05-10 MED ORDER — IOHEXOL 350 MG/ML SOLN
75.0000 mL | Freq: Once | INTRAVENOUS | Status: AC | PRN
  Administered 2019-05-10: 21:00:00 75 mL via INTRAVENOUS

## 2019-05-10 MED ORDER — ONDANSETRON HCL 4 MG/2ML IJ SOLN
4.0000 mg | Freq: Once | INTRAMUSCULAR | Status: AC
  Administered 2019-05-10: 10:00:00 4 mg via INTRAVENOUS

## 2019-05-10 MED ORDER — ONDANSETRON HCL 4 MG/2ML IJ SOLN
INTRAMUSCULAR | Status: AC
  Filled 2019-05-10: qty 2

## 2019-05-10 MED ORDER — MECLIZINE HCL 25 MG PO TABS
50.0000 mg | ORAL_TABLET | Freq: Once | ORAL | Status: AC
  Administered 2019-05-10: 11:00:00 50 mg via ORAL
  Filled 2019-05-10: qty 2

## 2019-05-10 MED ORDER — PROMETHAZINE HCL 25 MG/ML IJ SOLN
12.5000 mg | Freq: Once | INTRAMUSCULAR | Status: AC
  Administered 2019-05-10: 14:00:00 12.5 mg via INTRAVENOUS

## 2019-05-10 MED ORDER — SODIUM CHLORIDE 0.9 % IV BOLUS
1000.0000 mL | Freq: Once | INTRAVENOUS | Status: AC
  Administered 2019-05-10: 10:00:00 1000 mL via INTRAVENOUS

## 2019-05-10 NOTE — ED Triage Notes (Signed)
Arrived via EMS for c/o N/V onset this am, rec zofran IV by EMS

## 2019-05-10 NOTE — ED Triage Notes (Signed)
Transfer from [place] with N/V and need for MRI.

## 2019-05-10 NOTE — ED Notes (Signed)
Ambulated patient.  Patient states he feels a little lightheaded and unsteady.  PA aware and in to talk with patient about POC

## 2019-05-10 NOTE — ED Provider Notes (Signed)
79 year old with a chief complaint of dizziness.  Signed out to me by Dr. Sherry Ruffing.  Briefly the patient is a 79 year old male that has similar presentation that was consistent with vertigo.  History and exam is most consistent with the same.  Plan was for rehydration therapy Phenergan meclizine and reassess.  On reassessment the patient feels better but he is still extremely dizzy when he tries to move his head.  Attempted to ambulate but the patient was unable to even sit up without becoming extremely dizzy and vomiting.  At this time I think he needs to be ruled out for a posterior circulation stroke.  We will transfer him to [facility] for an MRI.  Give a dose of IV Valium here.  Dr. Rex Kras accepts in transfer.   Billy Etienne, DO 05/10/19 1737

## 2019-05-10 NOTE — ED Notes (Signed)
[employer] called and spoke with PA.  Patient's pacemaker not compatible with MRI.

## 2019-05-10 NOTE — ED Provider Notes (Signed)
[place] EMERGENCY DEPARTMENT Provider Note   CSN: [id number] Arrival date & time: 05/10/19  [id number]     History Chief Complaint  Patient presents with  . Nausea    [place] Camisa is a 79 y.o. male.  The history is provided by the patient and medical records. No language interpreter was used.  Emesis Severity:  Severe Duration:  1 day Timing:  Constant Quality:  Stomach contents Progression:  Unchanged Chronicity:  New Recent urination:  Normal Relieved by:  Nothing Worsened by:  Nothing Ineffective treatments:  None tried Associated symptoms: chills and diarrhea   Associated symptoms: no abdominal pain, no cough, no fever, no headaches, no myalgias and no URI        Past Medical History:  Diagnosis Date  . Adenomatous colon polyp 06/05/11   Repeat 2018 per Dr. Carlean Purl  . Allergic angioedema    ACE inhibitor/beta blocker  . Diverticulosis   . Gastroenteritis 2012  . Headache(784.0)   . Hyperlipidemia   . Hypertension   . Influenza A 02/2011  . Intermittent vertigo   . Microhematuria 01/06/2012   Dr. Orland Mustard, urologist in [place], did cysto and bladder bx 06/24/12 and it was unremarkable  . Prostate cancer [facility]) 2007   Dr. Harlow Asa ([facility] in [facility])  Pt is s/p brachytherapy 2011.  Jan 2015 PSA 0.6 ng/ml.  . 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 Caryl Comes)  . Syncope    ? Malignant vasovagal syncope-Pacer placed    Patient Active Problem List   Diagnosis Date Noted  . History of prostate cancer 01/12/2019  . Smoking 03/04/2013  . Hematuria 01/06/2012  . BPPV (benign paroxysmal positional vertigo) 08/06/2011  . Syncope   . Sinus arrest 04/09/2011  . Pacemaker-[place] 04/02/2011  . PROSTATE CANCER 11/09/2009  . Hyperlipidemia 11/14/2007  . Essential hypertension 10/25/2006    Past Surgical History:  Procedure Laterality Date  . CATARACT EXTRACTION  1984, 1994   (lens implants bilat)  . COLONOSCOPY  06/05/11   44mm sigmoid  polyp removed (+adenomatous), moderate diverticulosis, internal hemorrhoids.  Repeat 2018.  Marland Kitchen CYSTOSTOMY W/ BLADDER BIOPSY  06/24/12   Benign  . PACEMAKER INSERTION  02/2011   [employer]. [id number] Dual chamber; place in [place], [place] when pt visiting there for holiday 02/2011.  Marland Kitchen RADIOACTIVE SEED IMPLANT     prostate 07/2009.  PSA dropped to below 2 after this (Dr. Harlow Asa at [facility] in [facility])  . TONSILLECTOMY  1950  . TRANSTHORACIC ECHOCARDIOGRAM  02/2011   Normal       Family History  Problem Relation Age of Onset  . Stroke Mother        during endarterectomy  . Colon cancer Father   . Diabetes Neg Hx     Social History   Tobacco Use  . Smoking status: Former Smoker    Quit date: 03/26/1964    Years since quitting: 55.1  . Smokeless tobacco: Never Used  Substance Use Topics  . Alcohol use: No  . Drug use: No    Home Medications Prior to Admission medications   Medication Sig Start Date End Date Taking? Authorizing Provider  acetaminophen (TYLENOL) 325 MG tablet Take 500 mg by mouth as needed.     [provider]  Alfalfa 500 MG TABS 9-12 TABS/DAY     [provider]  b complex vitamins tablet Take 1 tablet by mouth daily.    [provider]  carvedilol (COREG)  25 MG tablet TAKE 1 TABLET BY MOUTH TWICE DAILY WITH MEALS 04/06/19   Wendling, Crosby Oyster, DO  cholecalciferol (VITAMIN D) 1000 UNITS tablet Take 1,000 Units by mouth daily.     [provider]  cloNIDine (CATAPRES) 0.1 MG tablet Take 1 tablet by mouth twice daily 04/06/19   Wendling, Crosby Oyster, DO  Flaxseed, Linseed, (FLAX SEED OIL PO) Take 1,000 mg by mouth 2 (two) times daily.    [provider]  NON FORMULARY Take 2 tablets by mouth daily. Prostate Ess plus    [provider]  NON FORMULARY Take 4 tablets by mouth daily. osteomatrix     [provider]  vitamin C (ASCORBIC ACID) 500 MG tablet Take 500 mg by mouth as needed.      [provider]    Allergies    Ace inhibitors, Aspirin, and Toprol xl [metoprolol succinate]  Review of Systems   Review of Systems  Constitutional: Positive for chills and fatigue. Negative for diaphoresis and fever.  HENT: Negative for congestion.   Respiratory: Negative for cough, chest tightness, shortness of breath and wheezing.   Cardiovascular: Negative for chest pain, palpitations and leg swelling.  Gastrointestinal: Positive for diarrhea, nausea and vomiting. Negative for abdominal distention, abdominal pain, blood in stool and constipation.  Genitourinary: Negative for dysuria and flank pain.  Musculoskeletal: Negative for back pain, myalgias, neck pain and neck stiffness.  Neurological: Positive for dizziness and light-headedness. Negative for seizures, syncope, speech difficulty, weakness, numbness and headaches.  Hematological: Negative for adenopathy.  Psychiatric/Behavioral: Negative for agitation and behavioral problems.  All other systems reviewed and are negative.   Physical Exam Updated Vital Signs BP (!) 171/77 (BP Location: Right Arm)   Pulse 63   Temp 97.6 F (36.4 C) (Oral)   Resp 20   Ht 5\' 9"  (1.753 m)   Wt 72.6 kg   SpO2 97%   BMI 23.63 kg/m   Physical Exam Vitals and nursing note reviewed.  Constitutional:      General: He is not in acute distress.    Appearance: He is well-developed. He is not ill-appearing, toxic-appearing or diaphoretic.  HENT:     Head: Normocephalic and atraumatic.     Nose: Nose normal. No congestion or rhinorrhea.     Mouth/Throat:     Mouth: Mucous membranes are dry.     Pharynx: Oropharyngeal exudate present. No posterior oropharyngeal erythema.  Eyes:     Extraocular Movements:     Right eye: Nystagmus present.     Left eye: Nystagmus present.     Conjunctiva/sclera: Conjunctivae normal.     Right eye: No hemorrhage.    Left eye: No hemorrhage.    Pupils: Pupils are equal, round, and reactive to light.   Cardiovascular:     Rate and Rhythm: Normal rate and regular rhythm.     Heart sounds: No murmur.  Pulmonary:     Effort: Pulmonary effort is normal. No respiratory distress.     Breath sounds: Normal breath sounds. No wheezing, rhonchi or rales.  Chest:     Chest wall: No tenderness.  Abdominal:     Palpations: Abdomen is soft.     Tenderness: There is no abdominal tenderness. There is no right CVA tenderness or left CVA tenderness.  Musculoskeletal:        General: No tenderness.     Cervical back: Neck supple. No tenderness.     Right lower leg: No edema.  Left lower leg: No edema.  Skin:    General: Skin is warm and dry.     Capillary Refill: Capillary refill takes less than 2 seconds.     Findings: No erythema.  Neurological:     General: No focal deficit present.     Mental Status: He is alert and oriented to person, place, and time.     Cranial Nerves: No cranial nerve deficit.     Sensory: No sensory deficit.     Motor: No weakness.     Coordination: Coordination normal.     Gait: Gait normal.  Psychiatric:        Mood and Affect: Mood normal.     ED Results / Procedures / Treatments   Labs (all labs ordered are listed, but only abnormal results are displayed) Labs Reviewed  URINALYSIS, ROUTINE W REFLEX MICROSCOPIC - Abnormal; Notable for the following components:      Result Value   APPearance CLOUDY (*)    pH 8.5 (*)    Hgb urine dipstick TRACE (*)    Ketones, ur 15 (*)    All other components within normal limits  COMPREHENSIVE METABOLIC PANEL - Abnormal; Notable for the following components:   Glucose, Bld 136 (*)    All other components within normal limits  CBC WITH DIFFERENTIAL/PLATELET - Abnormal; Notable for the following components:   WBC 14.4 (*)    Neutro Abs 13.0 (*)    Abs Immature Granulocytes 0.08 (*)    All other components within normal limits  URINALYSIS, MICROSCOPIC (REFLEX) - Abnormal; Notable for the following components:    Bacteria, UA FEW (*)    All other components within normal limits  RESPIRATORY PANEL BY RT PCR (FLU A&B, COVID)  LIPASE, BLOOD    EKG None  Radiology No results found.  Procedures Procedures (including critical care time)  Medications Ordered in ED Medications  ondansetron (ZOFRAN) 4 MG/2ML injection (has no administration in time range)  promethazine (PHENERGAN) 25 MG/ML injection (has no administration in time range)  meclizine (ANTIVERT) tablet 50 mg (50 mg Oral Given 05/10/19 1041)  sodium chloride 0.9 % bolus 1,000 mL (0 mLs Intravenous Stopped 05/10/19 1153)  ondansetron (ZOFRAN) injection 4 mg (4 mg Intravenous Given 05/10/19 1022)  promethazine (PHENERGAN) injection 12.5 mg (12.5 mg Intravenous Given 05/10/19 1402)    ED Course  I have reviewed the triage vital signs and the nursing notes.  Pertinent labs & imaging results that were available during my care of the patient were reviewed by me and considered in my medical decision making (see chart for details).    MDM Rules/Calculators/A&P                      Elean Mittelman is a 79 y.o. male with a past medical history significant for vertigo, prior prostate cancer, hypertension, hyperlipidemia, pacemaker, and diverticulosis who presents with nausea, vomiting, diarrhea, lightheadedness, and dizziness.  He reports that he has been doing well recently and had no preceding symptoms were gone to bed last night.  He reports he woke up this morning with nausea, vomiting, and extensive diarrhea.  He reports no significant pain but does report feeling lightheaded and then developed dizziness.  He reports the dizziness feels like prior vertigo but not quite as severe.  He reports no double vision, blurry vision, speech difficulties, numbness, tingling, weakness of extremities.  He reports this feels like the world is spinning similar to prior vertigo and it is  worsened how he is positioned with head movement.  He denies any neck pain or  neck stiffness.  No recent trauma.  His mouth feels dry he feels he is dehydrated.  On exam, mouth is dry.  No focal neurologic deficits with no finger-nose-finger abnormalities, normal sensation and strength in all extremities.  Symmetric smile.  Pupils do have some nystagmus but pupils are symmetric and reactive.  Neck nontender.  Back nontender.  Chest and abdomen nontender.  No murmur.  Patient having nausea during evaluation.  When I sat the patient up, his nystagmus worsened and is dizziness worsened.  Clinically I suspect he has dehydration related to nausea vomiting, diarrhea which exacerbated vertigo.  He will be given meclizine and Zofran for the nausea.  He will be given fluids for dehydration.  We will check some screening labs to make sure he does not have significant lecture light imbalance.  Given the similar to prior, the fluid shifts and fluid loss, and the position allergy of his symptoms, I have a very low suspicion this is a stroke and more suspicion this is vertigo.  Patient is agreeable to this and agrees on holding on imaging at this time.  Anticipate reassessment after rehydration and medications and labs.  After initial medications, he continued to have vomiting.  Patient will give more fluids and will be given Phenergan which she thinks he took in the past.  He thinks this will help him.  He was given dose of Phenergan and will be allowed to continue IV hydration.  I still suspect vertigo as the cause of his symptoms.  If patient is feeling better, anticipate discharge with prescription for meclizine and Phenergan.  Care transferred to oncoming team while awaiting reassessment and rehydration.  Final Clinical Impression(s) / ED Diagnoses Final diagnoses:  Dehydration  Nausea vomiting and diarrhea  Dizziness    Clinical Impression: 1. Dehydration   2. Nausea vomiting and diarrhea   3. Dizziness     Disposition:  Care transferred to oncoming team while awaiting  reassessment and rehydration.  This note was prepared with assistance of Systems analyst. Occasional wrong-word or sound-a-like substitutions may have occurred due to the inherent limitations of voice recognition software.      Jerrod Damiano, Gwenyth Allegra, MD 05/10/19 [phone]

## 2019-05-10 NOTE — ED Notes (Signed)
Called [facility] spoke to [place] to transport pt

## 2019-05-10 NOTE — ED Notes (Signed)
Patient with pacemaker, [employer].  MRI ordered.  Paging rep from [employer] about how to get the testing done with pacemaker

## 2019-05-10 NOTE — ED Notes (Signed)
Pt alert and oriented, denies any current pain.  No emesis since approx 1500 reported.  C/O dizziness this AM causing inability to walk and went to the ED.

## 2019-05-10 NOTE — ED Provider Notes (Signed)
The patient had an MRI ordered but has a pacemaker the pacemaker rep states that some places will do MRIs with this type of device but is not recommended by their company.  He states that there is nothing that they can program or shot it down to make it more amenable to MRI.  The patient was ambulated by me and he had a very difficult time walking he was having to hold on and stumbled at times.  The patient was able to walk some but became very unsteady and states that he was very dizzy.  I feel that unfortunately he will need to be admitted to the hospital for further evaluation and care.  I did speak with neurology about the patient Dr. Malen Gauze he recommended CT angio of the head and neck.  Those did not yield any significant abnormalities but with the patient's continued symptoms I feel that he will warrant admission to the hospital.   Dalia Heading, PA-C 05/10/19 [place], [place], MD 05/11/19 [phone]

## 2019-05-11 ENCOUNTER — Other Ambulatory Visit: Payer: Self-pay

## 2019-05-11 ENCOUNTER — Observation Stay (HOSPITAL_COMMUNITY): Payer: Medicare HMO

## 2019-05-11 ENCOUNTER — Encounter (HOSPITAL_COMMUNITY): Payer: Self-pay | Admitting: Internal Medicine

## 2019-05-11 DIAGNOSIS — I10 Essential (primary) hypertension: Secondary | ICD-10-CM | POA: Diagnosis not present

## 2019-05-11 DIAGNOSIS — R42 Dizziness and giddiness: Secondary | ICD-10-CM | POA: Diagnosis not present

## 2019-05-11 DIAGNOSIS — D72829 Elevated white blood cell count, unspecified: Secondary | ICD-10-CM | POA: Diagnosis not present

## 2019-05-11 LAB — COMPREHENSIVE METABOLIC PANEL
ALT: 16 U/L (ref 0–44)
AST: 31 U/L (ref 15–41)
Albumin: 3.6 g/dL (ref 3.5–5.0)
Alkaline Phosphatase: 75 U/L (ref 38–126)
Anion gap: 11 (ref 5–15)
BUN: 13 mg/dL (ref 8–23)
CO2: 24 mmol/L (ref 22–32)
Calcium: 8.5 mg/dL — ABNORMAL LOW (ref 8.9–10.3)
Chloride: 107 mmol/L (ref 98–111)
Creatinine, Ser: 0.97 mg/dL (ref 0.61–1.24)
GFR calc Af Amer: >60 mL/min (ref >60)
GFR calc non Af Amer: >60 mL/min (ref >60)
Glucose, Bld: 88 mg/dL (ref 70–99)
Potassium: 3.5 mmol/L (ref 3.5–5.1)
Sodium: 142 mmol/L (ref 135–145)
Total Bilirubin: 1.4 mg/dL — ABNORMAL HIGH (ref 0.3–1.2)
Total Protein: 6.3 g/dL — ABNORMAL LOW (ref 6.5–8.1)

## 2019-05-11 LAB — CBC WITH DIFFERENTIAL/PLATELET
Abs Immature Granulocytes: 0.05 10*3/uL (ref 0.00–0.07)
Basophils Absolute: 0 10*3/uL (ref 0.0–0.1)
Basophils Relative: 0 %
Eosinophils Absolute: 0 10*3/uL (ref 0.0–0.5)
Eosinophils Relative: 0 %
HCT: 39.9 % (ref 39.0–52.0)
Hemoglobin: 13.2 g/dL (ref 13.0–17.0)
Immature Granulocytes: 0 %
Lymphocytes Relative: 12 %
Lymphs Abs: 1.6 10*3/uL (ref 0.7–4.0)
MCH: 29.9 pg (ref 26.0–34.0)
MCHC: 33.1 g/dL (ref 30.0–36.0)
MCV: 90.5 fL (ref 80.0–100.0)
Monocytes Absolute: 0.8 10*3/uL (ref 0.1–1.0)
Monocytes Relative: 7 %
Neutro Abs: 10.3 10*3/uL — ABNORMAL HIGH (ref 1.7–7.7)
Neutrophils Relative %: 81 %
Platelets: 235 10*3/uL (ref 150–400)
RBC: 4.41 MIL/uL (ref 4.22–5.81)
RDW: 13.4 % (ref 11.5–15.5)
WBC: 12.8 10*3/uL — ABNORMAL HIGH (ref 4.0–10.5)
nRBC: 0 % (ref 0.0–0.2)

## 2019-05-11 MED ORDER — CARVEDILOL 25 MG PO TABS
25.0000 mg | ORAL_TABLET | Freq: Two times a day (BID) | ORAL | Status: DC
  Administered 2019-05-11 – 2019-05-12 (×3): 25 mg via ORAL
  Filled 2019-05-11 (×3): qty 1

## 2019-05-11 MED ORDER — CLONIDINE HCL 0.1 MG PO TABS
0.1000 mg | ORAL_TABLET | Freq: Two times a day (BID) | ORAL | Status: DC
  Administered 2019-05-11 – 2019-05-12 (×3): 0.1 mg via ORAL
  Filled 2019-05-11 (×3): qty 1

## 2019-05-11 MED ORDER — ONDANSETRON HCL 4 MG/2ML IJ SOLN: 4.0000 mg | Freq: Four times a day (QID) | INTRAMUSCULAR | Status: DC | PRN

## 2019-05-11 MED ORDER — ONDANSETRON HCL 4 MG PO TABS: 4.0000 mg | ORAL_TABLET | Freq: Four times a day (QID) | ORAL | Status: DC | PRN

## 2019-05-11 MED ORDER — ENOXAPARIN SODIUM 40 MG/0.4ML ~~LOC~~ SOLN
40.0000 mg | Freq: Every day | SUBCUTANEOUS | Status: DC
  Administered 2019-05-11 – 2019-05-12 (×2): 40 mg via SUBCUTANEOUS
  Filled 2019-05-11 (×2): qty 0.4

## 2019-05-11 MED ORDER — ACETAMINOPHEN 650 MG RE SUPP: 650.0000 mg | Freq: Four times a day (QID) | RECTAL | Status: DC | PRN

## 2019-05-11 MED ORDER — ACETAMINOPHEN 325 MG PO TABS: 650.0000 mg | ORAL_TABLET | Freq: Four times a day (QID) | ORAL | Status: DC | PRN

## 2019-05-11 NOTE — Progress Notes (Signed)
Physical Therapy Treatment Patient Details Name: Billy Burns MRN: [id number] DOB: 09-09-1940 Today's Date: 05/11/2019    History of Present Illness 79 y.o. male with history of sick sinus syndrome status post pacemaker, hypertension, orthostatic intolerance, and brief episode of A. fib. He presented to [facility] with c/o dizziness and N/V.    PT Comments    Pt admitted with above diagnosis. Pt was able to ambulate with RW with supervision after canalith repositining for right BPPV completed. Pt with less symptoms reported.  Feels better about going home and getting Outpt PT.  Will use RW if needed per pt.   Pt currently with functional limitations due to the deficits listed below (see PT Problem List). Pt will benefit from skilled PT to increase their independence and safety with mobility to allow discharge to the venue listed below.     Follow Up Recommendations  Outpatient PT;Supervision for mobility/OOB     Equipment Recommendations  Rolling walker with 5" wheels    Recommendations for Other Services       Precautions / Restrictions Precautions Precautions: Fall Restrictions Weight Bearing Restrictions: No    Mobility  Bed Mobility Overal bed mobility: Needs Assistance Bed Mobility: Supine to Sit;Sit to Supine     Supine to sit: Supervision;HOB elevated Sit to supine: Supervision;HOB elevated   General bed mobility comments: supervision for safety. No physical assist.  Assessed for BPPV and determined that pt had right BPPV based on symptoms. Treated with canalith repositioning for right BPPV with pt report of symptoms decreasing.   Transfers Overall transfer level: Needs assistance Equipment used: Rolling walker (2 wheeled) Transfers: Sit to/from [facility] Sit to Stand: Supervision Stand pivot transfers: Min guard       General transfer comment: No assist needed.   Ambulation/Gait Ambulation/Gait assistance: Supervision Gait  Distance (Feet): 110 Feet Assistive device: Rolling walker (2 wheeled) Gait Pattern/deviations: Step-through pattern;Decreased stride length Gait velocity: decreased   General Gait Details: Guarded gait but moves head with no dizziness per pt. No LOB noted. Pt reports a signficant difference in vertigo after the canalith repositioning.    Stairs             Wheelchair Mobility    Modified Rankin (Stroke Patients Only)       Balance Overall balance assessment: Needs assistance Sitting-balance support: No upper extremity supported;Feet supported Sitting balance-Leahy Scale: Good     Standing balance support: No upper extremity supported;Bilateral upper extremity supported;During functional activity Standing balance-Leahy Scale: Fair Standing balance comment: able to ambulate without AD. RW prefered due to providing increased stability/support.                            Cognition Arousal/Alertness: Awake/alert Behavior During Therapy: WFL for tasks assessed/performed Overall Cognitive Status: Within Functional Limits for tasks assessed                                        Exercises      General Comments General comments (skin integrity, edema, etc.): Pt reports that he is more steady after the BPPV treatment even with turns.       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   PT Goals (current goals can now be found in the care plan  section) Acute Rehab PT Goals Patient Stated Goal: home PT Goal Formulation: With patient Time For Goal Achievement: 05/25/19 Potential to Achieve Goals: Good Progress towards PT goals: Progressing toward goals    Frequency    Min 3X/week      PT Plan Discharge plan needs to be updated    Co-evaluation              AM-PAC PT "6 Clicks" Mobility   Outcome Measure  Help needed turning from your back to your side while in a flat bed without using bedrails?: None Help needed moving from lying on your back to sitting on the side of a flat bed without using bedrails?: None Help needed moving to and from a bed to a chair (including a wheelchair)?: A Little Help needed standing up from a chair using your arms (e.g., wheelchair or bedside chair)?: A Little Help needed to walk in hospital room?: A Little Help needed climbing 3-5 steps with a railing? : A Lot 6 Click Score: 19    End of Session Equipment Utilized During Treatment: Gait belt Activity Tolerance: Patient tolerated treatment well Patient left: with call bell/phone within reach;in chair Nurse Communication: Mobility status PT Visit Diagnosis: Unsteadiness on feet (R26.81);Difficulty in walking, not elsewhere classified (R26.2)     Time: [id number] PT Time Calculation (min) (ACUTE ONLY): 21 min  Charges:  $Gait Training: 8-22 mins $Therapeutic Activity: 8-22 mins                     Billy Burns,PT Acute Rehabilitation Services Pager:  [phone]  Office:  [place] 05/11/2019, 1:03 PM

## 2019-05-11 NOTE — TOC Initial Note (Signed)
Transition of Care [facility]) - Initial/Assessment Note    Patient Details  Name: Billy Burns MRN: [id number] Date of Birth: 06/07/1940  [facility] CM/SW Contact:    Pollie Friar, RN Phone Number: 05/11/2019, 3:22 PM  Clinical Narrative:                 Pt is from home with spouse. His daughter lives in home on 2nd floor.  Denies issues with transportation.  Pt interested in attending outpatient therapy at [facility]. Orders in Epic and information on the AVS.  Walker ordered through [employer]. TOC following for further d/c needs.   Expected Discharge Plan: IP Rehab Facility Barriers to Discharge: Continued Medical Work up   Patient Goals and CMS Choice     Choice offered to / list presented to : Patient  Expected Discharge Plan and Services Expected Discharge Plan: [place]   Discharge Planning Services: CM Consult Post Acute Care Choice: (outpatient therapy) Living arrangements for the past 2 months: Single Family Home                 DME Arranged: Walker rolling DME Agency: [employer] Date DME Agency Contacted: 05/11/19   Representative spoke with at DME Agency: [place]            Prior Living Arrangements/Services Living arrangements for the past 2 months: [place] Lives with:: Adult Children, Spouse Patient language and need for interpreter reviewed:: Yes Do you feel safe going back to the place where you live?: Yes      Need for Family Participation in Patient Care: Yes (Comment) Care giver support system in place?: Yes (comment)(spouse and daughter lives upstairs)   Criminal Activity/Legal Involvement Pertinent to Current Situation/Hospitalization: No - Comment as needed  Activities of Daily Living Home Assistive Devices/Equipment: None ADL Screening (condition at time of admission) Patient's cognitive ability adequate to safely complete daily activities?: Yes Is the patient deaf or have difficulty hearing?: No Does the  patient have difficulty seeing, even when wearing glasses/contacts?: No Does the patient have difficulty concentrating, remembering, or making decisions?: No Patient able to express need for assistance with ADLs?: Yes Does the patient have difficulty dressing or bathing?: No Independently performs ADLs?: Yes (appropriate for developmental age) Does the patient have difficulty walking or climbing stairs?: No Weakness of Legs: None Weakness of Arms/Hands: None  Permission Sought/Granted                  Emotional Assessment Appearance:: Appears stated age Attitude/Demeanor/Rapport: Engaged Affect (typically observed): Accepting, Pleasant Orientation: : Oriented to Self, Oriented to Place, Oriented to  Time, Oriented to Situation   Psych Involvement: No (comment)  Admission diagnosis:  Dehydration [E86.0] Dizziness [R42] Nausea vomiting and diarrhea [R11.2, R19.7] Patient Active Problem List   Diagnosis Date Noted  . Dizziness 05/11/2019  . History of prostate cancer 01/12/2019  . Smoking 03/04/2013  . Hematuria 01/06/2012  . BPPV (benign paroxysmal positional vertigo) 08/06/2011  . Syncope   . Sinus arrest 04/09/2011  . Pacemaker-[place] 04/02/2011  . PROSTATE CANCER 11/09/2009  . Hyperlipidemia 11/14/2007  . Essential hypertension 10/25/2006   PCP:  Shelda Pal, DO Pharmacy:   [facility], [place] [address] [place] [address] Phone: [phone] Fax: [phone]     Social Determinants of Health (SDOH) Interventions    Readmission Risk Interventions No flowsheet data found.

## 2019-05-11 NOTE — Progress Notes (Signed)
Patient placed in observation after midnight.  Care began prior to midnight.  Plan is for vestibular PT evaluation and repeat head CT in the a.m.  As he is unable to have MRI  Eulogio Bear DO

## 2019-05-11 NOTE — H&P (Signed)
History and Physical    Billy Burns [id number] DOB: 09-11-1940 DOA: 05/10/2019  PCP: Shelda Pal, DO  Patient coming from: Home.  Chief Complaint: Dizziness.  HPI: Billy Burns is a 79 y.o. male with history of sick sinus syndrome status post pacemaker, hypertension, orthostatic intolerance, brief episode of A. fib previously not a candidate for anticoagulation per cardiology notes started to experience dizziness since yesterday morning around 5 AM.  Patient went to the bathroom and following which patient started having dizziness which persisted even at rest.  Increases on movement.  Did not have any tinnitus or any weakness of the upper or lower extremities or any vomiting.  Had some brief episode of blurred vision of both eyes when ER patient was talking to the patient.  Had mild frontal headache.  EMS was called and patient was brought to the ER.  ED Course: In the ER since there was concern for stroke patient was transferred to [facility].  Since patient's pacemaker was not compatible with MRI neurologist Dr. Demetra Shiner advised to get CT angiogram of the head and neck which did not show any large vessel obstruction at this time requested observation and repeat CT head in 24 hours to make sure there was no stroke.  Patient dizziness persisted despite given meclizine and diazepam.  On exam patient appears nonfocal.  Patient did say that he did have a brief episode where he saw something like a comment behind the physician was examining him which lasted for a few seconds.  Labs are largely unremarkable except for leukocytosis.  Covid test was negative.  EKG and chest x-ray is pending.  UA unremarkable.  Patient afebrile.  Review of Systems: As per HPI, rest all negative.   Past Medical History:  Diagnosis Date  . Adenomatous colon polyp 06/05/11   Repeat 2018 per Dr. Carlean Purl  . Allergic angioedema    ACE inhibitor/beta blocker  . Diverticulosis   .  Gastroenteritis 2012  . Headache(784.0)   . Hyperlipidemia   . Hypertension   . Influenza A 02/2011  . Intermittent vertigo   . Microhematuria 01/06/2012   Dr. Orland Mustard, urologist in [place], did cysto and bladder bx 06/24/12 and it was unremarkable  . Prostate cancer [facility]) 2007   Dr. Harlow Asa ([facility] in [facility])  Pt is s/p brachytherapy 2011.  Jan 2015 PSA 0.6 ng/ml.  . 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 Caryl Comes)  . Syncope    ? Malignant vasovagal syncope-Pacer placed    Past Surgical History:  Procedure Laterality Date  . CATARACT EXTRACTION  1984, 1994   (lens implants bilat)  . COLONOSCOPY  06/05/11   40mm sigmoid polyp removed (+adenomatous), moderate diverticulosis, internal hemorrhoids.  Repeat 2018.  Marland Kitchen CYSTOSTOMY W/ BLADDER BIOPSY  06/24/12   Benign  . PACEMAKER INSERTION  02/2011   [employer]. [id number] Dual chamber; place in [place], [place] when pt visiting there for holiday 02/2011.  Marland Kitchen RADIOACTIVE SEED IMPLANT     prostate 07/2009.  PSA dropped to below 2 after this (Dr. Harlow Asa at [facility] in [facility])  . TONSILLECTOMY  1950  . TRANSTHORACIC ECHOCARDIOGRAM  02/2011   Normal     reports that he quit smoking about 55 years ago. He has never used smokeless tobacco. He reports that he does not drink alcohol or use drugs.  Allergies  Allergen Reactions  . Ace Inhibitors Other (See Comments)    REACTION: Angioedema  .  Aspirin Other (See Comments)    REACTION: Stomach Discomfort/TAKES 81 MG BUT CANT TAKE 325 MG ASA PER PT.  . Sulfa Antibiotics     Pt does not remember - happened in high school  . Toprol Xl [Metoprolol Succinate] Other (See Comments)    Angioedema    Family History  Problem Relation Age of Onset  . Stroke Mother        during endarterectomy  . Colon cancer Father   . Diabetes Neg Hx     Prior to Admission medications   Medication Sig Start Date End Date Taking? Authorizing Provider  acetaminophen (TYLENOL) 325 MG  tablet Take 325 mg by mouth every 6 (six) hours as needed for moderate pain.    Yes [provider]  Alfalfa 500 MG TABS Take 9-12 tablets by mouth daily. 9-12 TABS/DAY    Yes [provider]  b complex vitamins tablet Take 1 tablet by mouth daily.   Yes [provider]  carvedilol (COREG) 25 MG tablet TAKE 1 TABLET BY MOUTH TWICE DAILY WITH MEALS Patient taking differently: Take 25 mg by mouth 2 (two) times daily with a meal.  04/06/19  Yes Wendling, Crosby Oyster, DO  cloNIDine (CATAPRES) 0.1 MG tablet Take 1 tablet by mouth twice daily Patient taking differently: Take 0.1 mg by mouth 2 (two) times daily.  04/06/19  Yes Wendling, Crosby Oyster, DO  Flaxseed, Linseed, (FLAX SEED OIL PO) Take 1,000 mg by mouth daily.    Yes [provider]  NON FORMULARY Take 1 tablet by mouth in the morning and at bedtime. Prostate Ess plus   Yes [provider]  OVER THE COUNTER MEDICATION Take 2 tablets by mouth every evening. HerbLax   Yes [provider]  Probiotic Product (PROBIOTIC DAILY PO) Take 1 capsule by mouth daily.   Yes [provider]  vitamin C (ASCORBIC ACID) 500 MG tablet Take 500 mg by mouth 2 (two) times daily.    Yes [provider]  vitamin E (VITAMIN E) 180 MG (400 UNITS) capsule Take 400 Units by mouth daily.   Yes [provider]    Physical Exam: Constitutional: Moderately built and nourished. Vitals:   05/10/19 2230 05/11/19 0000 05/11/19 0100 05/11/19 0200  BP: 134/62 125/62 138/63 133/63  Pulse: 80 69 76 62  Resp: 14 17 17 16   Temp:      TempSrc:      SpO2: 98% 96% 98% 98%  Weight:      Height:       Eyes: Anicteric no pallor. ENMT: No discharge from the ears eyes nose or mouth. Neck: No mass or.  No neck rigidity. Respiratory: No rhonchi or crepitations. Cardiovascular: S1-S2 heard. Abdomen: Soft nontender bowel sounds present. Musculoskeletal: No edema.  No joint effusion. Skin: No  rash. Neurologic: Alert awake oriented to time place and person.  Moves all extremities 5 x 5.  No facial asymmetry tongue is midline.  Pupils equal reactive to light. Psychiatric: Appears normal.   Labs on Admission: I have personally reviewed following labs and imaging studies  CBC: Recent Labs  Lab 05/10/19 0924  WBC 14.4*  NEUTROABS 13.0*  HGB 14.6  HCT 43.8  MCV 89.8  PLT [id number]   Basic Metabolic Panel: Recent Labs  Lab 05/10/19 0924  NA 140  K 3.7  CL 106  CO2 25  GLUCOSE 136*  BUN 14  CREATININE 0.85  CALCIUM 9.0   GFR: Estimated Creatinine Clearance: 69.3 mL/min (by  C-G formula based on SCr of 0.85 mg/dL). Liver Function Tests: Recent Labs  Lab 05/10/19 0924  AST 23  ALT 18  ALKPHOS 75  BILITOT 1.1  PROT 7.3  ALBUMIN 4.2   Recent Labs  Lab 05/10/19 0924  LIPASE 20   No results for input(s): AMMONIA in the last 168 hours. Coagulation Profile: No results for input(s): INR, PROTIME in the last 168 hours. Cardiac Enzymes: No results for input(s): CKTOTAL, CKMB, CKMBINDEX, TROPONINI in the last 168 hours. BNP (last 3 results) No results for input(s): PROBNP in the last 8760 hours. HbA1C: No results for input(s): HGBA1C in the last 72 hours. CBG: No results for input(s): GLUCAP in the last 168 hours. Lipid Profile: No results for input(s): CHOL, HDL, LDLCALC, TRIG, CHOLHDL, LDLDIRECT in the last 72 hours. Thyroid Function Tests: No results for input(s): TSH, T4TOTAL, FREET4, T3FREE, THYROIDAB in the last 72 hours. Anemia Panel: No results for input(s): VITAMINB12, FOLATE, FERRITIN, TIBC, IRON, RETICCTPCT in the last 72 hours. Urine analysis:    Component Value Date/Time   COLORURINE YELLOW 05/10/2019 1144   APPEARANCEUR CLOUDY (A) 05/10/2019 1144   LABSPEC 1.020 05/10/2019 1144   PHURINE 8.5 (H) 05/10/2019 1144   GLUCOSEU NEGATIVE 05/10/2019 1144   GLUCOSEU NEGATIVE 09/30/2006 0953   HGBUR TRACE (A) 05/10/2019 1144   BILIRUBINUR NEGATIVE  05/10/2019 1144   BILIRUBINUR neg 01/04/2012 1658   KETONESUR 15 (A) 05/10/2019 1144   PROTEINUR NEGATIVE 05/10/2019 1144   UROBILINOGEN 0.2 01/04/2012 1658   UROBILINOGEN 0.2 10/02/2006 0411   NITRITE NEGATIVE 05/10/2019 1144   LEUKOCYTESUR NEGATIVE 05/10/2019 1144   Sepsis Labs: @LABRCNTIP (procalcitonin:4,lacticidven:4) ) Recent Results (from the past 240 hour(s))  Respiratory Panel by RT PCR (Flu A&B, Covid) - Nasopharyngeal Swab     Status: None   Collection Time: 05/10/19 10:39 AM   Specimen: Nasopharyngeal Swab  Result Value Ref Range Status   SARS Coronavirus 2 by RT PCR NEGATIVE NEGATIVE Final    Comment: (NOTE) SARS-CoV-2 target nucleic acids are NOT DETECTED. The SARS-CoV-2 RNA is generally detectable in upper respiratoy specimens during the acute phase of infection. The lowest concentration of SARS-CoV-2 viral copies this assay can detect is 131 copies/mL. A negative result does not preclude SARS-Cov-2 infection and should not be used as the sole basis for treatment or other patient management decisions. A negative result may occur with  improper specimen collection/handling, submission of specimen other than nasopharyngeal swab, presence of viral mutation(s) within the areas targeted by this assay, and inadequate number of viral copies (<131 copies/mL). A negative result must be combined with clinical observations, patient history, and epidemiological information. The expected result is Negative. Fact Sheet for Patients:  PinkCheek.be Fact Sheet for Healthcare Providers:  GravelBags.it This test is not yet ap proved or cleared by the [place] FDA and  has been authorized for detection and/or diagnosis of SARS-CoV-2 by FDA under an Emergency Use Authorization (EUA). This EUA will remain  in effect (meaning this test can be used) for the duration of the COVID-19 declaration under Section 564(b)(1) of the  Act, 21 U.S.C. section 360bbb-3(b)(1), unless the authorization is terminated or revoked sooner.    Influenza A by PCR NEGATIVE NEGATIVE Final   Influenza B by PCR NEGATIVE NEGATIVE Final    Comment: (NOTE) The Xpert Xpress SARS-CoV-2/FLU/RSV assay is intended as an aid in  the diagnosis of influenza from Nasopharyngeal swab specimens and  should not be used as a sole basis for treatment. Nasal washings  and  aspirates are unacceptable for Xpert Xpress SARS-CoV-2/FLU/RSV  testing. Fact Sheet for Patients: PinkCheek.be Fact Sheet for Healthcare Providers: GravelBags.it This test is not yet approved or cleared by the [place] FDA and  has been authorized for detection and/or diagnosis of SARS-CoV-2 by  FDA under an Emergency Use Authorization (EUA). This EUA will remain  in effect (meaning this test can be used) for the duration of the  Covid-19 declaration under Section 564(b)(1) of the Act, 21  U.S.C. section 360bbb-3(b)(1), unless the authorization is  terminated or revoked. Performed at [facility], [address] [place], [place] [address]      Radiological Exams on Admission: CT Angio Head W or Wo Contrast  Result Date: 05/10/2019 CLINICAL DATA:  Vertigo, central. Additional history provided: 70-year-old male with dizziness and consistent vertigo, vertigo versus posterior circulation CVA EXAM: CT ANGIOGRAPHY HEAD AND NECK TECHNIQUE: Multidetector CT imaging of the head and neck was performed using the standard protocol during bolus administration of intravenous contrast. Multiplanar CT image reconstructions and MIPs were obtained to evaluate the vascular anatomy. Carotid stenosis measurements (when applicable) are obtained utilizing NASCET criteria, using the distal internal carotid diameter as the denominator. CONTRAST:  40mL OMNIPAQUE IOHEXOL 350 MG/ML SOLN COMPARISON:  Noncontrast head CT 10/02/2006  FINDINGS: CT HEAD FINDINGS Brain: No evidence of acute intracranial hemorrhage. No demarcated cortical infarction. No evidence of intracranial mass. No midline shift or extra-axial fluid collection. Mild generalized parenchymal atrophy, progressed as compared to prior CT 10/02/2006 Vascular: Reported separately Skull: Normal. Negative for fracture or focal lesion. Sinuses: No significant paranasal sinus disease or mastoid effusion at the imaged levels. Orbits: Visualized orbits demonstrate no acute abnormality. Review of the MIP images confirms the above findings CTA NECK FINDINGS Aortic arch: Standard aortic branching. Scattered calcified plaque within the visualized aortic arch and proximal major branch vessels of the neck. No significant innominate or proximal subclavian artery stenosis. Right carotid system: CCA and ICA patent within the neck without significant stenosis (50% or greater). Mild calcified plaque within the carotid bifurcation and proximal ICA. Left carotid system: CCA and ICA patent within the neck without significant stenosis (50% or greater). Mild calcified plaque within the proximal ICA. Vertebral arteries: Codominant. The right vertebral artery is patent throughout the neck without significant stenosis. Streak artifact from a dense left-sided contrast bolus limits evaluation of the V1 left vertebral artery. The left vertebral artery is otherwise patent within the neck without significant stenosis. Skeleton: No acute bony abnormality. Cervical spondylosis without high-grade bony spinal canal stenosis. Degenerative fusion across the C2-C3 and C4-C5 disc spaces. Other neck: No neck mass or cervical lymphadenopathy. Upper chest: No consolidation within the imaged lung apices. Review of the MIP images confirms the above findings CTA HEAD FINDINGS Anterior circulation: The intracranial internal carotid arteries are patent bilaterally with minimal scattered plaque but no significant stenosis. The M1  middle cerebral arteries are patent bilaterally without significant stenosis. No M2 proximal branch occlusion or high-grade proximal arterial stenosis is identified. The anterior cerebral arteries are patent without high-grade proximal stenosis. The A1 left ACA is hypoplastic. No intracranial aneurysm is identified. Posterior circulation: The intracranial vertebral arteries are patent bilaterally without significant stenosis. The basilar artery is patent without significant stenosis. Predominantly fetal origin of the left posterior cerebral artery. The bilateral posterior cerebral arteries are patent without high-grade proximal stenosis. Mild/moderate focal stenosis within the distal P2 right PCA (series 15, image 22). A right posterior communicating artery is not  definitively identified and may be hypoplastic or absent. Venous sinuses: Within limitations of contrast timing, no convincing thrombus. Anatomic variants: As described Review of the MIP images confirms the above findings IMPRESSION: CT head: 1. No evidence of acute intracranial abnormality. 2. Mild generalized parenchymal atrophy, progressed as compared to head CT 10/02/2006. CTA neck: 1. The bilateral common and internal carotid arteries are patent within the neck without significant stenosis. Mild calcified plaque within the carotid systems. 2. The right vertebral artery is patent within the neck without significant stenosis. 3. Streak artifact from a dense left-sided contrast bolus limits evaluation of the V1 left vertebral artery. The left vertebral artery is otherwise patent within the neck without significant stenosis. CTA head: 1. No intracranial large vessel occlusion or proximal high-grade arterial stenosis. 2. Mild/moderate focal stenosis within the distal P2 right posterior cerebral artery. 3. Mild atherosclerotic disease within the intracranial internal carotid arteries without stenosis. Electronically Signed   By: Kellie Simmering DO   On:  05/10/2019 22:14   CT Angio Neck W and/or Wo Contrast  Result Date: 05/10/2019 CLINICAL DATA:  Vertigo, central. Additional history provided: 60-year-old male with dizziness and consistent vertigo, vertigo versus posterior circulation CVA EXAM: CT ANGIOGRAPHY HEAD AND NECK TECHNIQUE: Multidetector CT imaging of the head and neck was performed using the standard protocol during bolus administration of intravenous contrast. Multiplanar CT image reconstructions and MIPs were obtained to evaluate the vascular anatomy. Carotid stenosis measurements (when applicable) are obtained utilizing NASCET criteria, using the distal internal carotid diameter as the denominator. CONTRAST:  18mL OMNIPAQUE IOHEXOL 350 MG/ML SOLN COMPARISON:  Noncontrast head CT 10/02/2006 FINDINGS: CT HEAD FINDINGS Brain: No evidence of acute intracranial hemorrhage. No demarcated cortical infarction. No evidence of intracranial mass. No midline shift or extra-axial fluid collection. Mild generalized parenchymal atrophy, progressed as compared to prior CT 10/02/2006 Vascular: Reported separately Skull: Normal. Negative for fracture or focal lesion. Sinuses: No significant paranasal sinus disease or mastoid effusion at the imaged levels. Orbits: Visualized orbits demonstrate no acute abnormality. Review of the MIP images confirms the above findings CTA NECK FINDINGS Aortic arch: Standard aortic branching. Scattered calcified plaque within the visualized aortic arch and proximal major branch vessels of the neck. No significant innominate or proximal subclavian artery stenosis. Right carotid system: CCA and ICA patent within the neck without significant stenosis (50% or greater). Mild calcified plaque within the carotid bifurcation and proximal ICA. Left carotid system: CCA and ICA patent within the neck without significant stenosis (50% or greater). Mild calcified plaque within the proximal ICA. Vertebral arteries: Codominant. The right vertebral  artery is patent throughout the neck without significant stenosis. Streak artifact from a dense left-sided contrast bolus limits evaluation of the V1 left vertebral artery. The left vertebral artery is otherwise patent within the neck without significant stenosis. Skeleton: No acute bony abnormality. Cervical spondylosis without high-grade bony spinal canal stenosis. Degenerative fusion across the C2-C3 and C4-C5 disc spaces. Other neck: No neck mass or cervical lymphadenopathy. Upper chest: No consolidation within the imaged lung apices. Review of the MIP images confirms the above findings CTA HEAD FINDINGS Anterior circulation: The intracranial internal carotid arteries are patent bilaterally with minimal scattered plaque but no significant stenosis. The M1 middle cerebral arteries are patent bilaterally without significant stenosis. No M2 proximal branch occlusion or high-grade proximal arterial stenosis is identified. The anterior cerebral arteries are patent without high-grade proximal stenosis. The A1 left ACA is hypoplastic. No intracranial aneurysm is identified. Posterior circulation: The intracranial  vertebral arteries are patent bilaterally without significant stenosis. The basilar artery is patent without significant stenosis. Predominantly fetal origin of the left posterior cerebral artery. The bilateral posterior cerebral arteries are patent without high-grade proximal stenosis. Mild/moderate focal stenosis within the distal P2 right PCA (series 15, image 22). A right posterior communicating artery is not definitively identified and may be hypoplastic or absent. Venous sinuses: Within limitations of contrast timing, no convincing thrombus. Anatomic variants: As described Review of the MIP images confirms the above findings IMPRESSION: CT head: 1. No evidence of acute intracranial abnormality. 2. Mild generalized parenchymal atrophy, progressed as compared to head CT 10/02/2006. CTA neck: 1. The  bilateral common and internal carotid arteries are patent within the neck without significant stenosis. Mild calcified plaque within the carotid systems. 2. The right vertebral artery is patent within the neck without significant stenosis. 3. Streak artifact from a dense left-sided contrast bolus limits evaluation of the V1 left vertebral artery. The left vertebral artery is otherwise patent within the neck without significant stenosis. CTA head: 1. No intracranial large vessel occlusion or proximal high-grade arterial stenosis. 2. Mild/moderate focal stenosis within the distal P2 right posterior cerebral artery. 3. Mild atherosclerotic disease within the intracranial internal carotid arteries without stenosis. Electronically Signed   By: Kellie Simmering DO   On: 05/10/2019 22:14      Assessment/Plan Principal Problem:   Dizziness Active Problems:   Hyperlipidemia   Essential hypertension   Pacemaker-[place]   History of prostate cancer    1. Dizziness -neurologist recommended to have repeat CT head done in 24 hours which will be around 11 PM tonight.  Will get physical therapy consult will closely monitor.  Patient's dizziness is present even at lying down position however will check orthostatics. 2. Hypertension on clonidine and metoprolol. 3. Sick sinus syndrome status post pacemaker placement. 4. History of prostate cancer. 5. Leukocytosis cause not clear.  Follow CBC presently afebrile.  Chest x-ray pending.  EKG chest x-ray pending.   DVT prophylaxis: Lovenox. Code Status: Full code. Family Communication: Discussed with patient. Disposition Plan: Home. Consults called: ER physician discussed with neurologist. Admission status: Observation.   Rise Patience MD [facility] Pager [phone].  If 7PM-7AM, please contact night-coverage [email or web address] Password [email or web address]  05/11/2019, 2:21 AM

## 2019-05-11 NOTE — Evaluation (Signed)
Physical Therapy Evaluation Patient Details Name: Billy Burns MRN: [id number] DOB: 1940-07-25 Today's Date: 05/11/2019   History of Present Illness  79 y.o. male with history of sick sinus syndrome status post pacemaker, hypertension, orthostatic intolerance, and brief episode of A. fib. He presented to [facility] with c/o dizziness and N/V.    Clinical Impression  Pt admitted with above diagnosis. PTA pt lived at home with his wife and daughter. He was independent mobility/ADLs with occasional use of SPC. On eval, he required supervision bed mobility, min guard assist transfers and min guard assist ambulation 67' with RW. Pt with possible vestibular presentation. Will have vestibular PT see patient.  Pt currently with functional limitations due to the deficits listed below (see PT Problem List). Pt will benefit from skilled PT to increase their independence and safety with mobility to allow discharge to the venue listed below.       Follow Up Recommendations Home health PT;Supervision for mobility/OOB    Equipment Recommendations  Rolling walker with 5" wheels    Recommendations for Other Services       Precautions / Restrictions Precautions Precautions: Fall Restrictions Weight Bearing Restrictions: No      Mobility  Bed Mobility Overal bed mobility: Needs Assistance Bed Mobility: Supine to Sit;Sit to Supine     Supine to sit: Supervision;HOB elevated Sit to supine: Supervision;HOB elevated   General bed mobility comments: supervision for safety. No physical assist.  Transfers Overall transfer level: Needs assistance Equipment used: Ambulation equipment used Transfers: Sit to/from [facility] Sit to Stand: Min guard Stand pivot transfers: Min guard       General transfer comment: min guard for safety  Ambulation/Gait Ambulation/Gait assistance: Min guard Gait Distance (Feet): 60 Feet Assistive device: Rolling walker (2 wheeled) Gait  Pattern/deviations: Step-through pattern;Decreased stride length Gait velocity: decreased   General Gait Details: min guard for safety. Guarded gait. Pt reports feeling off balance. No LOB noted.  Stairs            Wheelchair Mobility    Modified Rankin (Stroke Patients Only)       Balance Overall balance assessment: Needs assistance Sitting-balance support: No upper extremity supported;Feet supported Sitting balance-Leahy Scale: Good     Standing balance support: No upper extremity supported;Bilateral upper extremity supported;During functional activity Standing balance-Leahy Scale: Fair Standing balance comment: able to ambulate without AD. RW prefered due to providing increased stability/support.                             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     Hand Dominance        Extremity/Trunk Assessment   Upper Extremity Assessment Upper Extremity Assessment: Overall WFL for tasks assessed    Lower Extremity Assessment Lower Extremity Assessment: Overall WFL for tasks assessed    Cervical / Trunk Assessment Cervical / Trunk Assessment: Normal  Communication   Communication: No difficulties  Cognition Arousal/Alertness:  Awake/alert Behavior During Therapy: WFL for tasks assessed/performed Overall Cognitive Status: Within Functional Limits for tasks assessed                                         General Comments General comments (skin integrity, edema, etc.): Pt holding head/neck in fixed neutral posture. Reports he can look to right/left with his eyes but turning his head makes the dizziness worse. Pt reports inability to look up due to the dizziness.    Exercises     Assessment/Plan    PT Assessment Patient needs continued PT services  PT Problem List Decreased mobility;Decreased balance;Decreased knowledge of use of DME;Decreased activity tolerance       PT Treatment Interventions DME instruction;Therapeutic activities;Gait training;Patient/family education;Therapeutic exercise;Stair training;Balance training;Functional mobility training    PT Goals (Current goals can be found in the Care Plan section)  Acute Rehab PT Goals Patient Stated Goal: home PT Goal Formulation: With patient Time For Goal Achievement: 05/25/19 Potential to Achieve Goals: Good    Frequency Min 3X/week   Barriers to discharge        Co-evaluation               AM-PAC PT "6 Clicks" Mobility  Outcome Measure Help needed turning from your back to your side while in a flat bed without using bedrails?: None Help needed moving from lying on your back to sitting on the side of a flat bed without using bedrails?: None Help needed moving to and from a bed to a chair (including a wheelchair)?: A Little Help needed standing up from a chair using your arms (e.g., wheelchair or bedside chair)?: A Little Help needed to walk in hospital room?: A Little Help needed climbing 3-5 steps with a railing? : A Lot 6 Click Score: 19    End of Session Equipment Utilized During Treatment: Gait belt Activity Tolerance: Patient tolerated treatment well Patient left: in bed;with call bell/phone within reach Nurse Communication: Mobility status PT Visit Diagnosis: Unsteadiness on feet (R26.81);Difficulty in walking, not elsewhere classified (R26.2)    Time: [id number] PT Time Calculation (min) (ACUTE ONLY):  24 min   Charges:   PT Evaluation $PT Eval Low Complexity: 1 Low PT Treatments $Gait Training: 8-22 mins        Lorrin Goodell, PT  Office # [phone] Pager [phone]   Lorriane Shire 05/11/2019, 11:13 AM

## 2019-05-11 NOTE — ED Notes (Signed)
Patient ambulated to bathroom with one assist.  States feeling like he is having vertigo.  One assist necessary for safety.  Patient had bowel movement.  Assisted back to bed.

## 2019-05-11 NOTE — Plan of Care (Signed)

## 2019-05-12 ENCOUNTER — Observation Stay (HOSPITAL_COMMUNITY): Payer: Medicare HMO

## 2019-05-12 DIAGNOSIS — R42 Dizziness and giddiness: Secondary | ICD-10-CM | POA: Diagnosis not present

## 2019-05-12 NOTE — Plan of Care (Signed)
Care plan goals met. Pt adequate for discharge.  

## 2019-05-12 NOTE — Discharge Summary (Signed)
Physician Discharge Summary  Bassil Bhuiyan [id number] DOB: 06/01/1940 DOA: 05/10/2019  PCP: Shelda Pal, DO  Admit date: 05/10/2019 Discharge date: 05/12/2019  Admitted From: Home Discharge disposition: Home   Recommendations for Outpatient Follow-Up:   1. Outpatient PT for vestibular training 2. Adjust blood pressure as able   Discharge Diagnosis:   Principal Problem:   Dizziness Active Problems:   Hyperlipidemia   Essential hypertension   Pacemaker-[place]   History of prostate cancer    Discharge Condition: Improved.  Diet recommendation: Low sodium, heart healthy.   Wound care: None.  Code status: Full.   History of Present Illness:   Billy Burns is a 79 y.o. male with history of sick sinus syndrome status post pacemaker, hypertension, orthostatic intolerance, brief episode of A. fib previously not a candidate for anticoagulation per cardiology notes started to experience dizziness since yesterday morning around 5 AM.  Patient went to the bathroom and following which patient started having dizziness which persisted even at rest.  Increases on movement.  Did not have any tinnitus or any weakness of the upper or lower extremities or any vomiting.  Had some brief episode of blurred vision of both eyes when ER patient was talking to the patient.  Had mild frontal headache.  EMS was called and patient was brought to the ER.  ED Course: In the ER since there was concern for stroke patient was transferred to [facility].  Since patient's pacemaker was not compatible with MRI neurologist Dr. Demetra Shiner advised to get CT angiogram of the head and neck which did not show any large vessel obstruction at this time requested observation and repeat CT head in 24 hours to make sure there was no stroke.  Patient dizziness persisted despite given meclizine and diazepam.  On exam patient appears nonfocal.  Patient did say that he did have a brief episode  where he saw something like a comment behind the physician was examining him which lasted for a few seconds.  Labs are largely unremarkable except for leukocytosis.  Covid test was negative.  EKG and chest x-ray is pending.  UA unremarkable.  Patient afebrile.    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 -Will defer to PCP for adjustment of medications  Sick sinus syndrome status post pacemaker placement.  History of prostate cancer.      Medical Consultants:      Discharge Exam:   Vitals:   05/12/19 0834 05/12/19 1032  BP: (!) 191/90 (!) 156/78  Pulse: 63   Resp:    Temp: 97.9 F (36.6 C)   SpO2: 96%    Vitals:   05/11/19 1654 05/12/19 0002 05/12/19 0834 05/12/19 1032  BP: (!) 167/75 (!) 148/67 (!) 191/90 (!) 156/78  Pulse: 63 65 63   Resp:      Temp:  98 F (36.7 C) 97.9 F (36.6 C)   TempSrc:      SpO2: 97% 96% 96%   Weight:      Height:        General exam: Appears calm and comfortable.    The results of significant diagnostics from this hospitalization (including imaging, microbiology, ancillary and laboratory) are listed below for  reference.     Procedures and Diagnostic Studies:   CT Angio Head W or Wo Contrast  Result Date: 05/10/2019 CLINICAL DATA:  Vertigo, central. Additional history provided: 87-year-old male with dizziness and consistent vertigo, vertigo versus posterior circulation CVA EXAM: CT ANGIOGRAPHY HEAD AND NECK TECHNIQUE: Multidetector CT imaging of the head and neck was performed using the standard protocol during bolus administration of intravenous contrast. Multiplanar CT image reconstructions and MIPs were obtained to evaluate the vascular anatomy.  Carotid stenosis measurements (when applicable) are obtained utilizing NASCET criteria, using the distal internal carotid diameter as the denominator. CONTRAST:  74mL OMNIPAQUE IOHEXOL 350 MG/ML SOLN COMPARISON:  Noncontrast head CT 10/02/2006 FINDINGS: CT HEAD FINDINGS Brain: No evidence of acute intracranial hemorrhage. No demarcated cortical infarction. No evidence of intracranial mass. No midline shift or extra-axial fluid collection. Mild generalized parenchymal atrophy, progressed as compared to prior CT 10/02/2006 Vascular: Reported separately Skull: Normal. Negative for fracture or focal lesion. Sinuses: No significant paranasal sinus disease or mastoid effusion at the imaged levels. Orbits: Visualized orbits demonstrate no acute abnormality. Review of the MIP images confirms the above findings CTA NECK FINDINGS Aortic arch: Standard aortic branching. Scattered calcified plaque within the visualized aortic arch and proximal major branch vessels of the neck. No significant innominate or proximal subclavian artery stenosis. Right carotid system: CCA and ICA patent within the neck without significant stenosis (50% or greater). Mild calcified plaque within the carotid bifurcation and proximal ICA. Left carotid system: CCA and ICA patent within the neck without significant stenosis (50% or greater). Mild calcified plaque within the proximal ICA. Vertebral arteries: Codominant. The right vertebral artery is patent throughout the neck without significant stenosis. Streak artifact from a dense left-sided contrast bolus limits evaluation of the V1 left vertebral artery. The left vertebral artery is otherwise patent within the neck without significant stenosis. Skeleton: No acute bony abnormality. Cervical spondylosis without high-grade bony spinal canal stenosis. Degenerative fusion across the C2-C3 and C4-C5 disc spaces. Other neck: No neck mass or cervical lymphadenopathy. Upper chest: No consolidation within the  imaged lung apices. Review of the MIP images confirms the above findings CTA HEAD FINDINGS Anterior circulation: The intracranial internal carotid arteries are patent bilaterally with minimal scattered plaque but no significant stenosis. The M1 middle cerebral arteries are patent bilaterally without significant stenosis. No M2 proximal branch occlusion or high-grade proximal arterial stenosis is identified. The anterior cerebral arteries are patent without high-grade proximal stenosis. The A1 left ACA is hypoplastic. No intracranial aneurysm is identified. Posterior circulation: The intracranial vertebral arteries are patent bilaterally without significant stenosis. The basilar artery is patent without significant stenosis. Predominantly fetal origin of the left posterior cerebral artery. The bilateral posterior cerebral arteries are patent without high-grade proximal stenosis. Mild/moderate focal stenosis within the distal P2 right PCA (series 15, image 22). A right posterior communicating artery is not definitively identified and may be hypoplastic or absent. Venous sinuses: Within limitations of contrast timing, no convincing thrombus. Anatomic variants: As described Review of the MIP images confirms the above findings IMPRESSION: CT head: 1. No evidence of acute intracranial abnormality. 2. Mild generalized parenchymal atrophy, progressed as compared to head CT 10/02/2006. CTA neck: 1. The bilateral common and internal carotid arteries are patent within the neck without significant stenosis. Mild calcified plaque within the carotid systems. 2. The right vertebral artery is patent within the neck without significant stenosis. 3. Streak artifact from a dense left-sided contrast bolus limits evaluation of the V1 left vertebral  artery. The left vertebral artery is otherwise patent within the neck without significant stenosis. CTA head: 1. No intracranial large vessel occlusion or proximal high-grade arterial  stenosis. 2. Mild/moderate focal stenosis within the distal P2 right posterior cerebral artery. 3. Mild atherosclerotic disease within the intracranial internal carotid arteries without stenosis. Electronically Signed   By: Kellie Simmering DO   On: 05/10/2019 22:14   CT Angio Neck W and/or Wo Contrast  Result Date: 05/10/2019 CLINICAL DATA:  Vertigo, central. Additional history provided: 22-year-old male with dizziness and consistent vertigo, vertigo versus posterior circulation CVA EXAM: CT ANGIOGRAPHY HEAD AND NECK TECHNIQUE: Multidetector CT imaging of the head and neck was performed using the standard protocol during bolus administration of intravenous contrast. Multiplanar CT image reconstructions and MIPs were obtained to evaluate the vascular anatomy. Carotid stenosis measurements (when applicable) are obtained utilizing NASCET criteria, using the distal internal carotid diameter as the denominator. CONTRAST:  17mL OMNIPAQUE IOHEXOL 350 MG/ML SOLN COMPARISON:  Noncontrast head CT 10/02/2006 FINDINGS: CT HEAD FINDINGS Brain: No evidence of acute intracranial hemorrhage. No demarcated cortical infarction. No evidence of intracranial mass. No midline shift or extra-axial fluid collection. Mild generalized parenchymal atrophy, progressed as compared to prior CT 10/02/2006 Vascular: Reported separately Skull: Normal. Negative for fracture or focal lesion. Sinuses: No significant paranasal sinus disease or mastoid effusion at the imaged levels. Orbits: Visualized orbits demonstrate no acute abnormality. Review of the MIP images confirms the above findings CTA NECK FINDINGS Aortic arch: Standard aortic branching. Scattered calcified plaque within the visualized aortic arch and proximal major branch vessels of the neck. No significant innominate or proximal subclavian artery stenosis. Right carotid system: CCA and ICA patent within the neck without significant stenosis (50% or greater). Mild calcified plaque within  the carotid bifurcation and proximal ICA. Left carotid system: CCA and ICA patent within the neck without significant stenosis (50% or greater). Mild calcified plaque within the proximal ICA. Vertebral arteries: Codominant. The right vertebral artery is patent throughout the neck without significant stenosis. Streak artifact from a dense left-sided contrast bolus limits evaluation of the V1 left vertebral artery. The left vertebral artery is otherwise patent within the neck without significant stenosis. Skeleton: No acute bony abnormality. Cervical spondylosis without high-grade bony spinal canal stenosis. Degenerative fusion across the C2-C3 and C4-C5 disc spaces. Other neck: No neck mass or cervical lymphadenopathy. Upper chest: No consolidation within the imaged lung apices. Review of the MIP images confirms the above findings CTA HEAD FINDINGS Anterior circulation: The intracranial internal carotid arteries are patent bilaterally with minimal scattered plaque but no significant stenosis. The M1 middle cerebral arteries are patent bilaterally without significant stenosis. No M2 proximal branch occlusion or high-grade proximal arterial stenosis is identified. The anterior cerebral arteries are patent without high-grade proximal stenosis. The A1 left ACA is hypoplastic. No intracranial aneurysm is identified. Posterior circulation: The intracranial vertebral arteries are patent bilaterally without significant stenosis. The basilar artery is patent without significant stenosis. Predominantly fetal origin of the left posterior cerebral artery. The bilateral posterior cerebral arteries are patent without high-grade proximal stenosis. Mild/moderate focal stenosis within the distal P2 right PCA (series 15, image 22). A right posterior communicating artery is not definitively identified and may be hypoplastic or absent. Venous sinuses: Within limitations of contrast timing, no convincing thrombus. Anatomic variants: As  described Review of the MIP images confirms the above findings IMPRESSION: CT head: 1. No evidence of acute intracranial abnormality. 2. Mild generalized parenchymal atrophy, progressed as compared to  head CT 10/02/2006. CTA neck: 1. The bilateral common and internal carotid arteries are patent within the neck without significant stenosis. Mild calcified plaque within the carotid systems. 2. The right vertebral artery is patent within the neck without significant stenosis. 3. Streak artifact from a dense left-sided contrast bolus limits evaluation of the V1 left vertebral artery. The left vertebral artery is otherwise patent within the neck without significant stenosis. CTA head: 1. No intracranial large vessel occlusion or proximal high-grade arterial stenosis. 2. Mild/moderate focal stenosis within the distal P2 right posterior cerebral artery. 3. Mild atherosclerotic disease within the intracranial internal carotid arteries without stenosis. Electronically Signed   By: Kellie Simmering DO   On: 05/10/2019 22:14   DG Chest Port 1 View  Result Date: 05/11/2019 CLINICAL DATA:  Leukocytosis. EXAM: PORTABLE CHEST 1 VIEW COMPARISON:  10/02/2006 FINDINGS: Normal heart size and mediastinal contours. Dual-chamber pacer leads from the left in good position. No acute infiltrate or edema. No effusion or pneumothorax. No acute osseous findings. IMPRESSION: No active disease. Electronically Signed   By: Monte Fantasia M.D.   On: 05/11/2019 07:16     Labs:   Basic Metabolic Panel: Recent Labs  Lab 05/10/19 0924 05/11/19 0737  NA 140 142  K 3.7 3.5  CL 106 107  CO2 25 24  GLUCOSE 136* 88  BUN 14 13  CREATININE 0.85 0.97  CALCIUM 9.0 8.5*   GFR Estimated Creatinine Clearance: 60.7 mL/min (by C-G formula based on SCr of 0.97 mg/dL). Liver Function Tests: Recent Labs  Lab 05/10/19 0924 05/11/19 0737  AST 23 31  ALT 18 16  ALKPHOS 75 75  BILITOT 1.1 1.4*  PROT 7.3 6.3*  ALBUMIN 4.2 3.6   Recent  Labs  Lab 05/10/19 0924  LIPASE 20   No results for input(s): AMMONIA in the last 168 hours. Coagulation profile No results for input(s): INR, PROTIME in the last 168 hours.  CBC: Recent Labs  Lab 05/10/19 0924 05/11/19 0737  WBC 14.4* 12.8*  NEUTROABS 13.0* 10.3*  HGB 14.6 13.2  HCT 43.8 39.9  MCV 89.8 90.5  PLT 241 235   Cardiac Enzymes: No results for input(s): CKTOTAL, CKMB, CKMBINDEX, TROPONINI in the last 168 hours. BNP: Invalid input(s): POCBNP CBG: No results for input(s): GLUCAP in the last 168 hours. D-Dimer No results for input(s): DDIMER in the last 72 hours. Hgb A1c No results for input(s): HGBA1C in the last 72 hours. Lipid Profile No results for input(s): CHOL, HDL, LDLCALC, TRIG, CHOLHDL, LDLDIRECT in the last 72 hours. Thyroid function studies No results for input(s): TSH, T4TOTAL, T3FREE, THYROIDAB in the last 72 hours.  Invalid input(s): FREET3 Anemia work up No results for input(s): VITAMINB12, FOLATE, FERRITIN, TIBC, IRON, RETICCTPCT in the last 72 hours. Microbiology Recent Results (from the past 240 hour(s))  Respiratory Panel by RT PCR (Flu A&B, Covid) - Nasopharyngeal Swab     Status: None   Collection Time: 05/10/19 10:39 AM   Specimen: Nasopharyngeal Swab  Result Value Ref Range Status   SARS Coronavirus 2 by RT PCR NEGATIVE NEGATIVE Final    Comment: (NOTE) SARS-CoV-2 target nucleic acids are NOT DETECTED. The SARS-CoV-2 RNA is generally detectable in upper respiratoy specimens during the acute phase of infection. The lowest concentration of SARS-CoV-2 viral copies this assay can detect is 131 copies/mL. A negative result does not preclude SARS-Cov-2 infection and should not be used as the sole basis for treatment or other patient management decisions. A negative result may  occur with  improper specimen collection/handling, submission of specimen other than nasopharyngeal swab, presence of viral mutation(s) within the areas targeted  by this assay, and inadequate number of viral copies (<131 copies/mL). A negative result must be combined with clinical observations, patient history, and epidemiological information. The expected result is Negative. Fact Sheet for Patients:  PinkCheek.be Fact Sheet for Healthcare Providers:  GravelBags.it This test is not yet ap proved or cleared by the [place] FDA and  has been authorized for detection and/or diagnosis of SARS-CoV-2 by FDA under an Emergency Use Authorization (EUA). This EUA will remain  in effect (meaning this test can be used) for the duration of the COVID-19 declaration under Section 564(b)(1) of the Act, 21 U.S.C. section 360bbb-3(b)(1), unless the authorization is terminated or revoked sooner.    Influenza A by PCR NEGATIVE NEGATIVE Final   Influenza B by PCR NEGATIVE NEGATIVE Final    Comment: (NOTE) The Xpert Xpress SARS-CoV-2/FLU/RSV assay is intended as an aid in  the diagnosis of influenza from Nasopharyngeal swab specimens and  should not be used as a sole basis for treatment. Nasal washings and  aspirates are unacceptable for Xpert Xpress SARS-CoV-2/FLU/RSV  testing. Fact Sheet for Patients: PinkCheek.be Fact Sheet for Healthcare Providers: GravelBags.it This test is not yet approved or cleared by the [place] FDA and  has been authorized for detection and/or diagnosis of SARS-CoV-2 by  FDA under an Emergency Use Authorization (EUA). This EUA will remain  in effect (meaning this test can be used) for the duration of the  Covid-19 declaration under Section 564(b)(1) of the Act, 21  U.S.C. section 360bbb-3(b)(1), unless the authorization is  terminated or revoked. Performed at [facility], [address] [place], [place] [address]      Discharge Instructions:   Discharge Instructions    Ambulatory referral to  Physical Therapy   Complete by: As directed    Diet - low sodium heart healthy   Complete by: As directed    Increase activity slowly   Complete by: As directed      Allergies as of 05/12/2019      Reactions   Ace Inhibitors Other (See Comments)   REACTION: Angioedema   Aspirin Other (See Comments)   REACTION: Stomach Discomfort/TAKES 81 MG BUT CANT TAKE 325 MG ASA PER PT.   Sulfa Antibiotics    Pt does not remember - happened in high school   Toprol Xl [metoprolol Succinate] Other (See Comments)   Angioedema      Medication List    TAKE these medications   acetaminophen 325 MG tablet Commonly known as: TYLENOL Take 325 mg by mouth every 6 (six) hours as needed for moderate pain.   Alfalfa 500 MG Tabs Take 9-12 tablets by mouth daily. 9-12 TABS/DAY   b complex vitamins tablet Take 1 tablet by mouth daily.   carvedilol 25 MG tablet Commonly known as: COREG TAKE 1 TABLET BY MOUTH TWICE DAILY WITH MEALS   cloNIDine 0.1 MG tablet Commonly known as: CATAPRES Take 1 tablet by mouth twice daily   FLAX SEED OIL PO Take 1,000 mg by mouth daily.   NON FORMULARY Take 1 tablet by mouth in the morning and at bedtime. Prostate Ess plus   OVER THE COUNTER MEDICATION Take 2 tablets by mouth every evening. HerbLax   PROBIOTIC DAILY PO Take 1 capsule by mouth daily.   vitamin C 500 MG tablet Commonly known as: ASCORBIC ACID Take 500 mg  by mouth 2 (two) times daily.   vitamin E 180 MG (400 UNITS) capsule Generic drug: vitamin E Take 400 Units by mouth daily.            Durable Medical Equipment  (From admission, onward)         Start     Ordered   05/11/19 1533  For home use only DME Walker rolling  Once    Question Answer Comment  Walker: With 5 Inch Wheels   Patient needs a walker to treat with the following condition Dizziness      05/11/19 1533         Follow-up Information    [facility] Follow up.   Specialty:  Rehabilitation Why: The outpatient therapy will contact you for the first appointment. Contact information: [place] [id number] [place] [id number] [place], [place], [place] Follow up in 1 week(s).   Specialty: Family Medicine Contact information: [place] [address] [phone]            Time coordinating discharge: 35 min  Signed:  Geradine Girt DO  [facility] 05/12/2019, 1:57 PM

## 2019-05-12 NOTE — Progress Notes (Signed)
Physical Therapy Treatment Patient Details Name: Billy Burns MRN: [id number] DOB: 1941-01-06 Today's Date: 05/12/2019    History of Present Illness 79 y.o. male with history of sick sinus syndrome status post pacemaker, hypertension, orthostatic intolerance, and brief episode of A. fib. He presented to [facility] with c/o dizziness and N/V. 05/11/19 PT evaluation found rt posterior canal BPPV and treated with canalith repositioning. 05/12/19 BPPV cleared however pt with diminished VOR indicative of rt vestibular hypofunction.     PT Comments    Patient assessed for rt posterior canal BPPV and now clear. He does however have symptoms of rt vestibular hypofunction with impaired VOR. See below for specific education pt provided. He is safe to discharge with RW (set up for his height) and follow-up OPPT for vestibular rehab.     Follow Up Recommendations  Outpatient PT;Supervision for mobility/OOB(PT for vestibular rehab)     Equipment Recommendations  Rolling walker with 5" wheels    Recommendations for Other Services       Precautions / Restrictions Precautions Precautions: Fall Restrictions Weight Bearing Restrictions: No    Mobility  Bed Mobility Overal bed mobility: Independent Bed Mobility: Sit to Supine       Sit to supine: Independent   General bed mobility comments: pt with no reports of dizziness as return to supine  Transfers Overall transfer level: Needs assistance Equipment used: Rolling walker (2 wheeled) Transfers: Sit to/from Stand Sit to Stand: Supervision         General transfer comment: No assist needed. vc for proper sequencing with RW (especially as approaching a surface to sit--he tried to "park" the walker off to the side)  Ambulation/Gait Ambulation/Gait assistance: Supervision Gait Distance (Feet): 40 Feet(x2) Assistive device: Rolling walker (2 wheeled);None Gait Pattern/deviations: Step-through pattern;Decreased stride  length Gait velocity: decreased   General Gait Details: no device with guarded gait and reaching for wall/surfaces (admits he's been doing this even prior to vertigo); reports slight "blip" with his vision when turning head to the right and with turning to his right)   Stairs             Wheelchair Mobility    Modified Rankin (Stroke Patients Only)       Balance Overall balance assessment: Needs assistance Sitting-balance support: No upper extremity supported;Feet supported Sitting balance-Leahy Scale: Good     Standing balance support: No upper extremity supported;Bilateral upper extremity supported;During functional activity Standing balance-Leahy Scale: Fair Standing balance comment: able to ambulate without AD. RW prefered due to providing increased stability/support.                            Cognition Arousal/Alertness: Awake/alert Behavior During Therapy: WFL for tasks assessed/performed Overall Cognitive Status: Within Functional Limits for tasks assessed                                        Exercises      General Comments General comments (skin integrity, edema, etc.): Repeated rt posterior canal Epley maneuver with pt asymptomatic. As walking he reported vision "blips" with rt turns. Assessed VOR with rt hypofunction apparent. Educated pt in seated rt head turns initially supported and then unsupported sitting producing symptoms. Educated to not avoid rt head turns or rt turns in standing, but to continue to use RW as long as he has symptoms and/or  until he sees OPPT. Educated on riding home in back seat of the car and focusing on objects inside the car to diminish symptoms and nausea. Patient reports long history of motion sensitivity/sickness.       Pertinent Vitals/Pain Pain Assessment: No/denies pain    Home Living                      Prior Function            PT Goals (current goals can now be found in the  care plan section) Acute Rehab PT Goals Patient Stated Goal: home Time For Goal Achievement: 05/25/19 Potential to Achieve Goals: Good Progress towards PT goals: Progressing toward goals    Frequency    Min 3X/week      PT Plan Current plan remains appropriate    Co-evaluation              AM-PAC PT "6 Clicks" Mobility   Outcome Measure  Help needed turning from your back to your side while in a flat bed without using bedrails?: None Help needed moving from lying on your back to sitting on the side of a flat bed without using bedrails?: None Help needed moving to and from a bed to a chair (including a wheelchair)?: A Little Help needed standing up from a chair using your arms (e.g., wheelchair or bedside chair)?: A Little Help needed to walk in hospital room?: A Little Help needed climbing 3-5 steps with a railing? : A Little 6 Click Score: 20    End of Session Equipment Utilized During Treatment: Gait belt Activity Tolerance: Patient tolerated treatment well Patient left: with call bell/phone within reach;in bed Nurse Communication: Mobility status;Other (comment)(OK to dc from PT perspective) PT Visit Diagnosis: Unsteadiness on feet (R26.81);Difficulty in walking, not elsewhere classified (R26.2)     Time: [id number] PT Time Calculation (min) (ACUTE ONLY): 36 min  Charges:  $Gait Training: 8-22 mins $Therapeutic Activity: 8-22 mins                      Arby Barrette, PT Pager [phone]    Rexanne Mano 05/12/2019, 10:34 AM

## 2019-05-13 ENCOUNTER — Telehealth: Payer: Self-pay | Admitting: *Deleted

## 2019-05-13 NOTE — Telephone Encounter (Signed)
LVM for pt to call office and schedule hospital follow up appt.

## 2019-05-14 NOTE — Telephone Encounter (Signed)
I have made two attempts and have been unable to reach patient. I have sent the patient a letter requesting they call the office to schedule a hospital follow up appointment.

## 2019-05-28 ENCOUNTER — Encounter: Payer: Self-pay | Admitting: Physical Therapy

## 2019-05-28 ENCOUNTER — Other Ambulatory Visit: Payer: Self-pay

## 2019-05-28 ENCOUNTER — Ambulatory Visit: Payer: Medicare HMO | Admitting: Physical Therapy

## 2019-05-28 VITALS — BP 200/100 | HR 71

## 2019-05-28 NOTE — Therapy (Signed)
[facility] [address]  [place], [place], [address] Phone: [phone]   Fax:  [phone]  Physical Therapy Note  Patient Details  Name: Billy Burns MRN: [id number] Date of Birth: 07-12-1940 No data recorded  Encounter Date: 05/28/2019    Past Medical History:  Diagnosis Date  . Adenomatous colon polyp 06/05/11   Repeat 2018 per Dr. Carlean Purl  . Allergic angioedema    ACE inhibitor/beta blocker  . Diverticulosis   . Gastroenteritis 2012  . Headache(784.0)   . Hyperlipidemia   . Hypertension   . Influenza A 02/2011  . Intermittent vertigo   . Microhematuria 01/06/2012   Dr. Orland Mustard, urologist in [place], did cysto and bladder bx 06/24/12 and it was unremarkable  . Prostate cancer [facility]) 2007   Dr. Harlow Asa ([facility])  Pt is s/p brachytherapy 2011.  Jan 2015 PSA 0.6 ng/ml.  . 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 Caryl Comes)  . Syncope    ? Malignant vasovagal syncope-Pacer placed    Past Surgical History:  Procedure Laterality Date  . CATARACT EXTRACTION  1984, 1994   (lens implants bilat)  . COLONOSCOPY  06/05/11   4mm sigmoid polyp removed (+adenomatous), moderate diverticulosis, internal hemorrhoids.  Repeat 2018.  Marland Kitchen CYSTOSTOMY W/ BLADDER BIOPSY  06/24/12   Benign  . PACEMAKER INSERTION  02/2011   [employer]. [id number] Dual chamber; place in [place], [place] when pt visiting there for holiday 02/2011.  Marland Kitchen RADIOACTIVE SEED IMPLANT     prostate 07/2009.  PSA dropped to below 2 after this (Dr. Harlow Asa at [facility] in [facility])  . TONSILLECTOMY  1950  . TRANSTHORACIC ECHOCARDIOGRAM  02/2011   Normal    Vitals:   05/28/19 1513 05/28/19 1523 05/28/19 1528 05/28/19 1533  BP: (!) 170/72 (!) 170/84 (!) 183/82 (!) 200/100  Pulse: 71     SpO2: 97%        Subjective Assessment - 05/28/19 1516    Subjective  Patient notes being hospitalized for dizziness from  02/14-02/16/2021. When he was discharged, he had to use his walker consistently. However, was able to take a shower today without problems. Dizziness is worse with moving around and turning his head. No worse with bed mobility.  Dizziness lasts for seconds and feels like a lag or unsteadiness. Around the time of hospitalization he would get fatigued quickly and had low appetite, but this is improving. Uses RW out in the community and [facility] or furniture walks inside the house. Denies recent nausea, vomiting, lightheadedness, syncope.    Pertinent History  pacemaker, orthostasis, prostate CA, HTN, HLD, HA    Diagnostic tests  05/12/19 CT head: No acute findings    Patient Stated Goals  "get rid of the walker and feel comfortable walking"    Currently in Pain?  Yes    Pain Score  1     Pain Location  Head    Pain Orientation  Posterior    Pain Descriptors / Indicators  Aching    Pain Type  Acute pain        Objective measurements completed on examination: See above findings.                           Plan - 05/28/19 1604    Clinical Impression Statement  PT evaluation not completed today d/t patient's increasingly high BP readings. Communicated with patient's  PCP office who advised that patient should be seen in ED or urgent care. Relayed this to patient, however he chose to go home, noting that he would re-check his BP at home and present to ED if BP remained high. Escorted patient out of clinic for safety.       Patient will benefit from skilled therapeutic intervention in order to improve the following deficits and impairments:     Visit Diagnosis: Dizziness and giddiness  Unsteadiness on feet     Problem List Patient Active Problem List   Diagnosis Date Noted  . Dizziness 05/11/2019  . History of prostate cancer 01/12/2019  . Smoking 03/04/2013  . Hematuria 01/06/2012  . BPPV (benign paroxysmal positional vertigo) 08/06/2011  . Syncope   . Sinus arrest  04/09/2011  . Pacemaker-[place] 04/02/2011  . PROSTATE CANCER 11/09/2009  . Hyperlipidemia 11/14/2007  . Essential hypertension 10/25/2006    Manuela Neptune 05/28/2019, 4:06 PM  [facility] [address]  [place], [place], [address] Phone: [phone]   Fax:  [phone]  Name: Qadry Belmonte MRN: [id number] Date of Birth: 12/02/1940

## 2019-06-01 ENCOUNTER — Other Ambulatory Visit: Payer: Self-pay

## 2019-06-02 ENCOUNTER — Other Ambulatory Visit: Payer: Self-pay

## 2019-06-02 ENCOUNTER — Encounter: Payer: Self-pay | Admitting: Family Medicine

## 2019-06-02 ENCOUNTER — Ambulatory Visit (INDEPENDENT_AMBULATORY_CARE_PROVIDER_SITE_OTHER): Payer: Medicare HMO | Admitting: Family Medicine

## 2019-06-02 VITALS — BP 152/80 | HR 67 | Temp 96.5°F | Ht 69.0 in | Wt 159.0 lb

## 2019-06-02 DIAGNOSIS — R519 Headache, unspecified: Secondary | ICD-10-CM

## 2019-06-02 DIAGNOSIS — I10 Essential (primary) hypertension: Secondary | ICD-10-CM

## 2019-06-02 MED ORDER — AMLODIPINE BESYLATE 5 MG PO TABS: 5.0000 mg | ORAL_TABLET | Freq: Every day | ORAL | 3 refills | Status: DC

## 2019-06-02 NOTE — Progress Notes (Signed)
Chief Complaint  Patient presents with  . Hospitalization Follow-up    vertigo    Subjective: Patient is a 79 y.o. male here for hosp f/u.  Pt admitted from 2/14-2/16 for BPPV. Received vest rehab and improved. Working with home PT, has a walker. Getting better with this. Has had a constant occ headache. CT head was neg. No other neuro s/s's.   Hypertension Patient presents for hypertension follow up. He does monitor home blood pressures. Blood pressures ranging on average from 140-150's/80-90's. He is compliant with medications- clonidine 0.1 mg bid, Coreg 25 mg bid. Patient has these side effects of medication: none He is usually adhering to a healthy diet overall. Exercise: walking  ROS: Heart: Denies chest pain  Lungs: Denies SOB   Past Medical History:  Diagnosis Date  . Adenomatous colon polyp 06/05/11   Repeat 2018 per Dr. Carlean Purl  . Allergic angioedema    ACE inhibitor/beta blocker  . Diverticulosis   . Gastroenteritis 2012  . Headache(784.0)   . Hyperlipidemia   . Hypertension   . Influenza A 02/2011  . Intermittent vertigo   . Microhematuria 01/06/2012   Dr. Orland Mustard, urologist in [place], did cysto and bladder bx 06/24/12 and it was unremarkable  . Prostate cancer [facility]) 2007   Dr. Harlow Asa ([facility] in [facility])  Pt is s/p brachytherapy 2011.  Jan 2015 PSA 0.6 ng/ml.  . 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 Caryl Comes)  . Syncope    ? Malignant vasovagal syncope-Pacer placed    Objective: BP (!) 152/80 (BP Location: Right Arm, Patient Position: Sitting, Cuff Size: Normal)   Pulse 67   Temp (!) 96.5 F (35.8 C) (Temporal)   Ht 5\' 9"  (1.753 m)   Wt 159 lb (72.1 kg)   SpO2 97%   BMI 23.48 kg/m  General: Awake, appears stated age HEENT: MMM, EOMi Heart: RRR, no bruits or LE edema Lungs: CTAB, no rales, wheezes or rhonchi. No accessory muscle use MSK: No ttp over the cerv parasp msc and occ triangle Psych: Age appropriate judgment and  insight, normal affect and mood  Assessment and Plan: Essential hypertension - Plan: amLODipine (NORVASC) 5 MG tablet  Occipital headache  1- Add back CCB. Cont Clonidine and Coreg. Cont to ck BP at home. Counseled on diet and exercise. 2- Stretches/exercises. Stay hydrated.  F/u in 4 weeks.  The patient voiced understanding and agreement to the plan.  [place], DO 06/02/19  11:53 AM

## 2019-06-02 NOTE — Patient Instructions (Addendum)
Keep the diet clean and stay active.  Continue checking your blood pressure.  Heat (pad or rice pillow in microwave) over affected area, 10-15 minutes twice daily.   OK to take Tylenol 1000 mg (2 extra strength tabs) or 975 mg (3 regular strength tabs) every 8 hours as needed.  EXERCISES RANGE OF MOTION (ROM) AND STRETCHING EXERCISES  These exercises may help you when beginning to rehabilitate your issue. In order to successfully resolve your symptoms, you must improve your posture. These exercises are designed to help reduce the forward-head and rounded-shoulder posture which contributes to this condition. Your symptoms may resolve with or without further involvement from your physician, physical therapist or athletic trainer. While completing these exercises, remember:   Restoring tissue flexibility helps normal motion to return to the joints. This allows healthier, less painful movement and activity.  An effective stretch should be held for at least 20 seconds, although you may need to begin with shorter hold times for comfort.  A stretch should never be painful. You should only feel a gentle lengthening or release in the stretched tissue.  Do not do any stretch or exercise that you cannot tolerate.  STRETCH- Axial Extensors  Lie on your back on the floor. You may bend your knees for comfort. Place a rolled-up hand towel or dish towel, about 2 inches in diameter, under the part of your head that makes contact with the floor.  Gently tuck your chin, as if trying to make a "double chin," until you feel a gentle stretch at the base of your head.  Hold 15-20 seconds. Repeat 2-3 times. Complete this exercise 1 time per day.   STRETCH - Axial Extension   Stand or sit on a firm surface. Assume a good posture: chest up, shoulders drawn back, abdominal muscles slightly tense, knees unlocked (if standing) and feet hip width apart.  Slowly retract your chin so your head slides back and your  chin slightly lowers. Continue to look straight ahead.  You should feel a gentle stretch in the back of your head. Be certain not to feel an aggressive stretch since this can cause headaches later.  Hold for 15-20 seconds. Repeat 2-3 times. Complete this exercise 1 time per day.  STRETCH - Cervical Side Bend   Stand or sit on a firm surface. Assume a good posture: chest up, shoulders drawn back, abdominal muscles slightly tense, knees unlocked (if standing) and feet hip width apart.  Without letting your nose or shoulders move, slowly tip your right / left ear to your shoulder until your feel a gentle stretch in the muscles on the opposite side of your neck.  Hold 15-20 seconds. Repeat 2-3 times. Complete this exercise 1-2 times per day.  STRETCH - Cervical Rotators   Stand or sit on a firm surface. Assume a good posture: chest up, shoulders drawn back, abdominal muscles slightly tense, knees unlocked (if standing) and feet hip width apart.  Keeping your eyes level with the ground, slowly turn your head until you feel a gentle stretch along the back and opposite side of your neck.  Hold 15-20 seconds. Repeat 2-3 times. Complete this exercise 1-2 times per day.  RANGE OF MOTION - Neck Circles   Stand or sit on a firm surface. Assume a good posture: chest up, shoulders drawn back, abdominal muscles slightly tense, knees unlocked (if standing) and feet hip width apart.  Gently roll your head down and around from the back of one shoulder to the  back of the other. The motion should never be forced or painful.  Repeat the motion 10-20 times, or until you feel the neck muscles relax and loosen. Repeat 2-3 times. Complete the exercise 1-2 times per day. STRENGTHENING EXERCISES - Cervical Strain and Sprain These exercises may help you when beginning to rehabilitate your injury. They may resolve your symptoms with or without further involvement from your physician, physical therapist, or  athletic trainer. While completing these exercises, remember:   Muscles can gain both the endurance and the strength needed for everyday activities through controlled exercises.  Complete these exercises as instructed by your physician, physical therapist, or athletic trainer. Progress the resistance and repetitions only as guided.  You may experience muscle soreness or fatigue, but the pain or discomfort you are trying to eliminate should never worsen during these exercises. If this pain does worsen, stop and make certain you are following the directions exactly. If the pain is still present after adjustments, discontinue the exercise until you can discuss the trouble with your clinician.  STRENGTH - Cervical Flexors, Isometric  Face a wall, standing about 6 inches away. Place a small pillow, a ball about 6-8 inches in diameter, or a folded towel between your forehead and the wall.  Slightly tuck your chin and gently push your forehead into the soft object. Push only with mild to moderate intensity, building up tension gradually. Keep your jaw and forehead relaxed.  Hold 10 to 20 seconds. Keep your breathing relaxed.  Release the tension slowly. Relax your neck muscles completely before you start the next repetition. Repeat 2-3 times. Complete this exercise 1 time per day.  STRENGTH- Cervical Lateral Flexors, Isometric   Stand about 6 inches away from a wall. Place a small pillow, a ball about 6-8 inches in diameter, or a folded towel between the side of your head and the wall.  Slightly tuck your chin and gently tilt your head into the soft object. Push only with mild to moderate intensity, building up tension gradually. Keep your jaw and forehead relaxed.  Hold 10 to 20 seconds. Keep your breathing relaxed.  Release the tension slowly. Relax your neck muscles completely before you start the next repetition. Repeat 2-3 times. Complete this exercise 1 time per day.  STRENGTH - Cervical  Extensors, Isometric   Stand about 6 inches away from a wall. Place a small pillow, a ball about 6-8 inches in diameter, or a folded towel between the back of your head and the wall.  Slightly tuck your chin and gently tilt your head back into the soft object. Push only with mild to moderate intensity, building up tension gradually. Keep your jaw and forehead relaxed.  Hold 10 to 20 seconds. Keep your breathing relaxed.  Release the tension slowly. Relax your neck muscles completely before you start the next repetition. Repeat 2-3 times. Complete this exercise 1 time per day.  POSTURE AND BODY MECHANICS CONSIDERATIONS Keeping correct posture when sitting, standing or completing your activities will reduce the stress put on different body tissues, allowing injured tissues a chance to heal and limiting painful experiences. The following are general guidelines for improved posture. Your physician or physical therapist will provide you with any instructions specific to your needs. While reading these guidelines, remember:  The exercises prescribed by your provider will help you have the flexibility and strength to maintain correct postures.  The correct posture provides the optimal environment for your joints to work. All of your joints have less  wear and tear when properly supported by a spine with good posture. This means you will experience a healthier, less painful body.  Correct posture must be practiced with all of your activities, especially prolonged sitting and standing. Correct posture is as important when doing repetitive low-stress activities (typing) as it is when doing a single heavy-load activity (lifting).  PROLONGED STANDING WHILE SLIGHTLY LEANING FORWARD When completing a task that requires you to lean forward while standing in one place for a long time, place either foot up on a stationary 2- to 4-inch high object to help maintain the best posture. When both feet are on the  ground, the low back tends to lose its slight inward curve. If this curve flattens (or becomes too large), then the back and your other joints will experience too much stress, fatigue more quickly, and can cause pain.   RESTING POSITIONS Consider which positions are most painful for you when choosing a resting position. If you have pain with flexion-based activities (sitting, bending, stooping, squatting), choose a position that allows you to rest in a less flexed posture. You would want to avoid curling into a fetal position on your side. If your pain worsens with extension-based activities (prolonged standing, working overhead), avoid resting in an extended position such as sleeping on your stomach. Most people will find more comfort when they rest with their spine in a more neutral position, neither too rounded nor too arched. Lying on a non-sagging bed on your side with a pillow between your knees, or on your back with a pillow under your knees will often provide some relief. Keep in mind, being in any one position for a prolonged period of time, no matter how correct your posture, can still lead to stiffness.  WALKING Walk with an upright posture. Your ears, shoulders, and hips should all line up. OFFICE WORK When working at a desk, create an environment that supports good, upright posture. Without extra support, muscles fatigue and lead to excessive strain on joints and other tissues.  CHAIR:  A chair should be able to slide under your desk when your back makes contact with the back of the chair. This allows you to work closely.  The chair's height should allow your eyes to be level with the upper part of your monitor and your hands to be slightly lower than your elbows.  Body position: ? Your feet should make contact with the floor. If this is not possible, use a foot rest. ? Keep your ears over your shoulders. This will reduce stress on your neck and low back.

## 2019-06-16 ENCOUNTER — Telehealth: Payer: Self-pay | Admitting: Family Medicine

## 2019-06-16 NOTE — Telephone Encounter (Signed)
Called left message to call back 

## 2019-06-16 NOTE — Telephone Encounter (Signed)
Pt stated okay.. But he  Only has 4 more left for rest of the month and wants to know what he do if he runs out.Billy Burns

## 2019-06-16 NOTE — Telephone Encounter (Signed)
Called informed the patient should be a refill on bottle currently he has

## 2019-06-16 NOTE — Telephone Encounter (Signed)
Go back to 1 daily and keep an eye on blood pressure at home. TY.

## 2019-06-16 NOTE — Telephone Encounter (Signed)
Patient states that he has been taking his medication wrong. He been taking 2 a day,  In stead of one of his amLODipine (NORVASC) 5 MG tablet [id number].Marland Kitchen He needs to know what should he do  Please advise

## 2019-07-06 ENCOUNTER — Ambulatory Visit (INDEPENDENT_AMBULATORY_CARE_PROVIDER_SITE_OTHER): Payer: Medicare HMO | Admitting: *Deleted

## 2019-07-06 DIAGNOSIS — R55 Syncope and collapse: Secondary | ICD-10-CM

## 2019-07-06 LAB — CUP PACEART REMOTE DEVICE CHECK
Battery Remaining Longevity: 30 mo
Battery Remaining Percentage: 47 %
Brady Statistic RA Percent Paced: 24 %
Brady Statistic RV Percent Paced: 6 %
Date Time Interrogation Session: 20210412025100
Implantable Lead Implant Date: 20121227
Implantable Lead Implant Date: 20121227
Implantable Lead Location: 753859
Implantable Lead Location: 753859
Implantable Lead Model: 4469
Implantable Lead Model: 4471
Implantable Lead Serial Number: 502103
Implantable Lead Serial Number: 552434
Implantable Pulse Generator Implant Date: 20121227
Lead Channel Impedance Value: 445 Ohm
Lead Channel Impedance Value: 450 Ohm
Lead Channel Pacing Threshold Amplitude: 0.8 V
Lead Channel Pacing Threshold Pulse Width: 0.4 ms
Lead Channel Setting Pacing Amplitude: 2 V
Lead Channel Setting Pacing Amplitude: 2.6 V
Lead Channel Setting Pacing Pulse Width: 0.4 ms
Lead Channel Setting Sensing Sensitivity: 2.5 mV
Pulse Gen Serial Number: 120835

## 2019-07-07 NOTE — Progress Notes (Signed)
PPM Remote  

## 2019-07-11 ENCOUNTER — Other Ambulatory Visit: Payer: Self-pay | Admitting: Family Medicine

## 2019-08-27 DIAGNOSIS — R69 Illness, unspecified: Secondary | ICD-10-CM | POA: Diagnosis not present

## 2019-09-04 DIAGNOSIS — R69 Illness, unspecified: Secondary | ICD-10-CM | POA: Diagnosis not present

## 2019-10-11 ENCOUNTER — Other Ambulatory Visit: Payer: Self-pay | Admitting: Family Medicine

## 2019-12-30 ENCOUNTER — Telehealth: Payer: Self-pay | Admitting: Family Medicine

## 2019-12-30 NOTE — Telephone Encounter (Signed)
Copied from [place] [phone]. Topic: Medicare AWV >> Dec 30, 2019 12:48 PM Cher Nakai R wrote: Reason for CRM:   LM cancelling AWVS with [facility]. We will  call back to reschedule-srs

## 2020-01-09 ENCOUNTER — Other Ambulatory Visit: Payer: Self-pay | Admitting: Family Medicine

## 2020-01-15 ENCOUNTER — Ambulatory Visit (INDEPENDENT_AMBULATORY_CARE_PROVIDER_SITE_OTHER): Payer: Medicare HMO | Admitting: Family Medicine

## 2020-01-15 ENCOUNTER — Ambulatory Visit: Payer: Medicare HMO | Admitting: *Deleted

## 2020-01-15 ENCOUNTER — Other Ambulatory Visit: Payer: Self-pay

## 2020-01-15 ENCOUNTER — Encounter: Payer: Self-pay | Admitting: Family Medicine

## 2020-01-15 VITALS — BP 142/78 | HR 82 | Temp 97.5°F | Ht 69.5 in | Wt 166.1 lb

## 2020-01-15 DIAGNOSIS — E785 Hyperlipidemia, unspecified: Secondary | ICD-10-CM

## 2020-01-15 DIAGNOSIS — Z23 Encounter for immunization: Secondary | ICD-10-CM

## 2020-01-15 DIAGNOSIS — Z0000 Encounter for general adult medical examination without abnormal findings: Secondary | ICD-10-CM

## 2020-01-15 NOTE — Progress Notes (Signed)
Chief Complaint  Patient presents with  . Annual Exam    Well Male Billy Burns is here for a complete physical.   His last physical was >1 year ago.  Current diet: in general, a "healthy" diet.   Current exercise: stretches for his back.  Weight trend: stable Fatigue out of ordinary? No. Seat belt? Yes.    Health maintenance Shingrix- No Tetanus- Yes Pneumonia vaccine- Yes  Past Medical History:  Diagnosis Date  . Adenomatous colon polyp 06/05/11   Repeat 2018 per Dr. Carlean Purl  . Allergic angioedema    ACE inhibitor/beta blocker  . Diverticulosis   . Gastroenteritis 2012  . Headache(784.0)   . Hyperlipidemia   . Hypertension   . Influenza A 02/2011  . Intermittent vertigo   . Microhematuria 01/06/2012   Dr. Orland Mustard, urologist in [place], did cysto and bladder bx 06/24/12 and it was unremarkable  . Prostate cancer [facility]) 2007   Dr. Harlow Asa ([facility])  Pt is s/p brachytherapy 2011.  Jan 2015 PSA 0.6 ng/ml.  . 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 Caryl Comes)  . Syncope    ? Malignant vasovagal syncope-Pacer placed     Past Surgical History:  Procedure Laterality Date  . CATARACT EXTRACTION  1984, 1994   (lens implants bilat)  . COLONOSCOPY  06/05/11   63mm sigmoid polyp removed (+adenomatous), moderate diverticulosis, internal hemorrhoids.  Repeat 2018.  Marland Kitchen CYSTOSTOMY W/ BLADDER BIOPSY  06/24/12   Benign  . PACEMAKER INSERTION  02/2011   [employer]. D326 Dual chamber; place in [place], [place] when pt visiting there for holiday 02/2011.  Marland Kitchen RADIOACTIVE SEED IMPLANT     prostate 07/2009.  PSA dropped to below 2 after this (Dr. Harlow Asa at [facility] in [facility])  . TONSILLECTOMY  1950  . TRANSTHORACIC ECHOCARDIOGRAM  02/2011   Normal    Medications  Current Outpatient Medications on File Prior to Visit  Medication Sig Dispense Refill  . acetaminophen (TYLENOL) 325 MG tablet Take 325 mg by mouth every 6 (six) hours as needed for  moderate pain.     . Alfalfa 500 MG TABS Take 9-12 tablets by mouth daily. 9-12 TABS/DAY     . amLODipine (NORVASC) 5 MG tablet Take 1 tablet (5 mg total) by mouth daily. 30 tablet 3  . b complex vitamins tablet Take 1 tablet by mouth daily.    . carvedilol (COREG) 25 MG tablet TAKE 1 TABLET BY MOUTH TWICE DAILY WITH MEALS 180 tablet 0  . cloNIDine (CATAPRES) 0.1 MG tablet Take 1 tablet by mouth twice daily 180 tablet 0  . dimenhyDRINATE (DRAMAMINE) 50 MG tablet Take 50 mg by mouth daily as needed.    . Flaxseed, Linseed, (FLAX SEED OIL PO) Take 1,000 mg by mouth daily.     . NON FORMULARY Take 1 tablet by mouth in the morning and at bedtime. Prostate Ess plus    . OVER THE COUNTER MEDICATION Take 2 tablets by mouth every evening. HerbLax    . Probiotic Product (PROBIOTIC DAILY PO) Take 1 capsule by mouth daily.    . vitamin C (ASCORBIC ACID) 500 MG tablet Take 500 mg by mouth 2 (two) times daily.     . vitamin E (VITAMIN E) 180 MG (400 UNITS) capsule Take 400 Units by mouth daily.      Allergies Allergies  Allergen Reactions  . Ace Inhibitors Other (See Comments)    REACTION: Angioedema  . Aspirin  Other (See Comments)    REACTION: Stomach Discomfort/TAKES 81 MG BUT CANT TAKE 325 MG ASA PER PT.  . Sulfa Antibiotics     Pt does not remember - happened in high school  . Toprol Xl [Metoprolol Succinate] Other (See Comments)    Angioedema    Family History Family History  Problem Relation Age of Onset  . Stroke Mother        during endarterectomy  . Colon cancer Father   . Diabetes Neg Hx     Review of Systems: Constitutional:  no fevers Eye:  no recent significant change in vision Ears:  No changes in hearing Nose/Mouth/Throat:  no complaints of nasal congestion, no sore throat Cardiovascular: no chest pain Respiratory:  No shortness of breath Gastrointestinal:  No change in bowel habits GU:  No frequency Integumentary:  no new abnormal skin lesions reported Neurologic:  +chronic vertigo Endocrine:  denies unexplained weight changes  Exam BP (!) 142/78 (BP Location: Left Arm, Patient Position: Sitting, Cuff Size: Normal)   Pulse 82   Temp (!) 97.5 F (36.4 C) (Oral)   Ht 5' 9.5" (1.765 m)   Wt 166 lb 2 oz (75.4 kg)   SpO2 96%   BMI 24.18 kg/m  General:  well developed, well nourished, in no apparent distress Skin:  no significant moles, warts, or growths Head:  no masses, lesions, or tenderness Eyes:  pupils equal and round, sclera anicteric without injection Ears:  canals without lesions, TMs shiny without retraction, no obvious effusion, no erythema Nose:  nares patent, septum midline, mucosa normal Throat/Pharynx:  lips and gingiva without lesion; tongue and uvula midline; non-inflamed pharynx; no exudates or postnasal drainage Lungs:  clear to auscultation, breath sounds equal bilaterally, no respiratory distress Cardio:  regular rate and rhythm, no LE edema or bruits Rectal: Deferred GI: BS+, S, NT, ND, no masses or organomegaly Musculoskeletal:  symmetrical muscle groups noted without atrophy or deformity Neuro:  gait normal; deep tendon reflexes normal and symmetric Psych: well oriented with normal range of affect and appropriate judgment/insight  Assessment and Plan  Well adult exam  Need for influenza vaccination - Plan: Flu Vaccine QUAD High Dose(Fluad)  Hyperlipidemia, unspecified hyperlipidemia type - Plan: Lipid panel, Comprehensive metabolic panel   Well 79 y.o. male. Counseled on diet and exercise. Other orders as above. Shingrix rec'd.  Follow up in 6 mo or prn.  The patient voiced understanding and agreement to the plan.  [place], DO 01/15/20 10:01 AM

## 2020-01-15 NOTE — Patient Instructions (Addendum)
Give [place] 2-3 business days to get the results of your labs back.   Keep the diet clean and stay active.  The new Shingrix vaccine (for shingles) is a 2 shot series. It can make people feel low energy, achy and almost like they have the flu for 48 hours after injection. Please plan accordingly when deciding on when to get this shot. Call the pharmacy appointment to get this. The second shot of the series is less severe regarding the side effects, but it still lasts 48 hours.   Let us know if you need anything.

## 2020-03-01 DIAGNOSIS — R69 Illness, unspecified: Secondary | ICD-10-CM | POA: Diagnosis not present

## 2020-04-11 ENCOUNTER — Other Ambulatory Visit: Payer: Self-pay | Admitting: Family Medicine

## 2020-06-21 DIAGNOSIS — Z87891 Personal history of nicotine dependence: Secondary | ICD-10-CM | POA: Diagnosis not present

## 2020-06-21 DIAGNOSIS — I2510 Atherosclerotic heart disease of native coronary artery without angina pectoris: Secondary | ICD-10-CM | POA: Diagnosis not present

## 2020-06-21 DIAGNOSIS — I10 Essential (primary) hypertension: Secondary | ICD-10-CM | POA: Diagnosis not present

## 2020-06-21 DIAGNOSIS — I495 Sick sinus syndrome: Secondary | ICD-10-CM | POA: Diagnosis not present

## 2020-06-21 DIAGNOSIS — Z809 Family history of malignant neoplasm, unspecified: Secondary | ICD-10-CM | POA: Diagnosis not present

## 2020-06-21 DIAGNOSIS — Z825 Family history of asthma and other chronic lower respiratory diseases: Secondary | ICD-10-CM | POA: Diagnosis not present

## 2020-06-21 DIAGNOSIS — Z950 Presence of cardiac pacemaker: Secondary | ICD-10-CM | POA: Diagnosis not present

## 2020-06-21 DIAGNOSIS — Z008 Encounter for other general examination: Secondary | ICD-10-CM | POA: Diagnosis not present

## 2020-10-10 ENCOUNTER — Other Ambulatory Visit: Payer: Self-pay | Admitting: Family Medicine

## 2020-12-07 ENCOUNTER — Telehealth: Payer: Self-pay | Admitting: Family Medicine

## 2020-12-07 NOTE — Telephone Encounter (Signed)
Left message for patient to call back and schedule Medicare Annual Wellness Visit (AWV) in office.   If not able to come in office, please offer to do virtually or by telephone.  Left office number and my jabber [phone].  Last AWV:06/21/2016  Please schedule at anytime with Nurse Health Advisor.

## 2020-12-17 ENCOUNTER — Ambulatory Visit (INDEPENDENT_AMBULATORY_CARE_PROVIDER_SITE_OTHER): Payer: Medicare HMO

## 2020-12-17 VITALS — Ht 70.0 in | Wt 163.0 lb

## 2020-12-17 DIAGNOSIS — Z0000 Encounter for general adult medical examination without abnormal findings: Secondary | ICD-10-CM

## 2020-12-17 NOTE — Patient Instructions (Signed)
Mr. Billy Burns , Thank you for taking time to come for your Medicare Wellness Visit. I appreciate your ongoing commitment to your health goals. Please review the following plan we discussed and let me know if I can assist you in the future.   Screening recommendations/referrals: Colonoscopy: Done 05/29/2011 - repeat not required Recommended yearly ophthalmology/optometry visit for glaucoma screening and checkup Recommended yearly dental visit for hygiene and checkup  Vaccinations: Influenza vaccine: Done 01/15/2020 - Repeat annually *next appointment Pneumococcal vaccine: Done 11/14/2007 & 05/15/2013 Tdap vaccine: Done 09/06/2016 - Repeat in 10 years Shingles vaccine: Zostavax done 2012 - Shingrix discussed. Please contact your pharmacy for coverage information.     Covid-19: Done 06/03/19, 07/01/19, & 11/14/19  Advanced directives: Please bring a copy of your health care power of attorney and living will to the office to be added to your chart at your convenience.   Conditions/risks identified: Aim for 30 minutes of exercise or brisk walking each day, drink 6-8 glasses of water and eat lots of fruits and vegetables.   Next appointment: Follow up in one year for your annual wellness visit.   Preventive Care 90 Years and Older, Male  Preventive care refers to lifestyle choices and visits with your health care provider that can promote health and wellness. What does preventive care include? A yearly physical exam. This is also called an annual well check. Dental exams once or twice a year. Routine eye exams. Ask your health care provider how often you should have your eyes checked. Personal lifestyle choices, including: Daily care of your teeth and gums. Regular physical activity. Eating a healthy diet. Avoiding tobacco and drug use. Limiting alcohol use. Practicing safe sex. Taking low doses of aspirin every day. Taking vitamin and mineral supplements as recommended by your health care  provider. What happens during an annual well check? The services and screenings done by your health care provider during your annual well check will depend on your age, overall health, lifestyle risk factors, and family history of disease. Counseling  Your health care provider may ask you questions about your: Alcohol use. Tobacco use. Drug use. Emotional well-being. Home and relationship well-being. Sexual activity. Eating habits. History of falls. Memory and ability to understand (cognition). Work and work Statistician. Screening  You may have the following tests or measurements: Height, weight, and BMI. Blood pressure. Lipid and cholesterol levels. These may be checked every 5 years, or more frequently if you are over 13 years old. Skin check. Lung cancer screening. You may have this screening every year starting at age 37 if you have a 30-pack-year history of smoking and currently smoke or have quit within the past 15 years. Fecal occult blood test (FOBT) of the stool. You may have this test every year starting at age 20. Flexible sigmoidoscopy or colonoscopy. You may have a sigmoidoscopy every 5 years or a colonoscopy every 10 years starting at age 37. Prostate cancer screening. Recommendations will vary depending on your family history and other risks. Hepatitis C blood test. Hepatitis B blood test. Sexually transmitted disease (STD) testing. Diabetes screening. This is done by checking your blood sugar (glucose) after you have not eaten for a while (fasting). You may have this done every 1-3 years. Abdominal aortic aneurysm (AAA) screening. You may need this if you are a current or former smoker. Osteoporosis. You may be screened starting at age 23 if you are at high risk. Talk with your health care provider about your test results, treatment options,  and if necessary, the need for more tests. Vaccines  Your health care provider may recommend certain vaccines, such  as: Influenza vaccine. This is recommended every year. Tetanus, diphtheria, and acellular pertussis (Tdap, Td) vaccine. You may need a Td booster every 10 years. Zoster vaccine. You may need this after age 75. Pneumococcal 13-valent conjugate (PCV13) vaccine. One dose is recommended after age 98. Pneumococcal polysaccharide (PPSV23) vaccine. One dose is recommended after age 60. Talk to your health care provider about which screenings and vaccines you need and how often you need them. This information is not intended to replace advice given to you by your health care provider. Make sure you discuss any questions you have with your health care provider. Document Released: 04/08/2015 Document Revised: 11/30/2015 Document Reviewed: 01/11/2015 Elsevier Interactive Patient Education  2017 [place] Prevention in the Home Falls can cause injuries. They can happen to people of all ages. There are many things you can do to make your home safe and to help prevent falls. What can I do on the outside of my home? Regularly fix the edges of walkways and driveways and fix any cracks. Remove anything that might make you trip as you walk through a door, such as a raised step or threshold. Trim any bushes or trees on the path to your home. Use bright outdoor lighting. Clear any walking paths of anything that might make someone trip, such as rocks or tools. Regularly check to see if handrails are loose or broken. Make sure that both sides of any steps have handrails. Any raised decks and porches should have guardrails on the edges. Have any leaves, snow, or ice cleared regularly. Use sand or salt on walking paths during winter. Clean up any spills in your garage right away. This includes oil or grease spills. What can I do in the bathroom? Use night lights. Install grab bars by the toilet and in the tub and shower. Do not use towel bars as grab bars. Use non-skid mats or decals in the tub or  shower. If you need to sit down in the shower, use a plastic, non-slip stool. Keep the floor dry. Clean up any water that spills on the floor as soon as it happens. Remove soap buildup in the tub or shower regularly. Attach bath mats securely with double-sided non-slip rug tape. Do not have throw rugs and other things on the floor that can make you trip. What can I do in the bedroom? Use night lights. Make sure that you have a light by your bed that is easy to reach. Do not use any sheets or blankets that are too big for your bed. They should not hang down onto the floor. Have a firm chair that has side arms. You can use this for support while you get dressed. Do not have throw rugs and other things on the floor that can make you trip. What can I do in the kitchen? Clean up any spills right away. Avoid walking on wet floors. Keep items that you use a lot in easy-to-reach places. If you need to reach something above you, use a strong step stool that has a grab bar. Keep electrical cords out of the way. Do not use floor polish or wax that makes floors slippery. If you must use wax, use non-skid floor wax. Do not have throw rugs and other things on the floor that can make you trip. What can I do with my stairs? Do not leave  any items on the stairs. Make sure that there are handrails on both sides of the stairs and use them. Fix handrails that are broken or loose. Make sure that handrails are as long as the stairways. Check any carpeting to make sure that it is firmly attached to the stairs. Fix any carpet that is loose or worn. Avoid having throw rugs at the top or bottom of the stairs. If you do have throw rugs, attach them to the floor with carpet tape. Make sure that you have a light switch at the top of the stairs and the bottom of the stairs. If you do not have them, ask someone to add them for you. What else can I do to help prevent falls? Wear shoes that: Do not have high heels. Have  rubber bottoms. Are comfortable and fit you well. Are closed at the toe. Do not wear sandals. If you use a stepladder: Make sure that it is fully opened. Do not climb a closed stepladder. Make sure that both sides of the stepladder are locked into place. Ask someone to hold it for you, if possible. Clearly mark and make sure that you can see: Any grab bars or handrails. First and last steps. Where the edge of each step is. Use tools that help you move around (mobility aids) if they are needed. These include: Canes. Walkers. Scooters. Crutches. Turn on the lights when you go into a dark area. Replace any light bulbs as soon as they burn out. Set up your furniture so you have a clear path. Avoid moving your furniture around. If any of your floors are uneven, fix them. If there are any pets around you, be aware of where they are. Review your medicines with your doctor. Some medicines can make you feel dizzy. This can increase your chance of falling. Ask your doctor what other things that you can do to help prevent falls. This information is not intended to replace advice given to you by your health care provider. Make sure you discuss any questions you have with your health care provider. Document Released: 01/06/2009 Document Revised: 08/18/2015 Document Reviewed: 04/16/2014 Elsevier Interactive Patient Education  2017 [employer].

## 2020-12-17 NOTE — Progress Notes (Signed)
Subjective:   Billy Burns is a 80 y.o. male who presents for Medicare Annual/Subsequent preventive examination.  Virtual Visit via Telephone Note  I connected with  Billy Burns on 12/17/20 at 10:00 AM EDT by telephone and verified that I am speaking with the correct person using two identifiers.  Location: Patient: Home Provider: [facility] Persons participating in the virtual visit: patient/Nurse Health Advisor   I discussed the limitations, risks, security and privacy concerns of performing an evaluation and management service by telephone and the availability of in person appointments. The patient expressed understanding and agreed to proceed.  Interactive audio and video telecommunications were attempted between this nurse and patient, however failed, due to patient having technical difficulties OR patient did not have access to video capability.  We continued and completed visit with audio only.  Some vital signs may be absent or patient reported.   Vinal Rosengrant E Melyssa Signor, LPN   Review of Systems     Cardiac Risk Factors include: advanced age (>78men, >74 women);male gender;dyslipidemia;hypertension;sedentary lifestyle;Other (see comment), Risk factor comments: pacemaker     Objective:    Today's Vitals   12/17/20 1001  Weight: 163 lb (73.9 kg)  Height: 5\' 10"  (1.778 m)  PainSc: 3    Body mass index is 23.39 kg/m.  Advanced Directives 12/17/2020 05/28/2019 05/11/2019 05/10/2019 05/15/2017 07/30/2016 06/21/2016  Does Patient Have a Medical Advance Directive? No No - No No No No  Type of Advance Directive - - - - - - -  Does patient want to make changes to medical advance directive? - - - - - - -  Copy of [place] in Chart? - - - - - - -  Would patient like information on creating a medical advance directive? No - Patient declined No - Patient declined No - Patient declined - No - Patient declined No - Patient declined Yes (MAU/Ambulatory/Procedural Areas - Information  given)    Current Medications (verified) Outpatient Encounter Medications as of 12/17/2020  Medication Sig   acetaminophen (TYLENOL) 325 MG tablet Take 325 mg by mouth every 6 (six) hours as needed for moderate pain.    Alfalfa 500 MG TABS Take 9-12 tablets by mouth daily. 9-12 TABS/DAY    b complex vitamins tablet Take 1 tablet by mouth daily.   carvedilol (COREG) 25 MG tablet TAKE 1 TABLET BY MOUTH TWICE DAILY WITH A MEAL   cloNIDine (CATAPRES) 0.1 MG tablet Take 1 tablet by mouth twice daily   dimenhyDRINATE (DRAMAMINE) 50 MG tablet Take 50 mg by mouth daily as needed.   Flaxseed, Linseed, (FLAX SEED OIL PO) Take 1,000 mg by mouth daily.    NON FORMULARY Take 1 tablet by mouth in the morning and at bedtime. Prostate Ess plus   OVER THE COUNTER MEDICATION Take 2 tablets by mouth every evening. HerbLax   Probiotic Product (PROBIOTIC DAILY PO) Take 1 capsule by mouth daily.   vitamin C (ASCORBIC ACID) 500 MG tablet Take 500 mg by mouth 2 (two) times daily.    vitamin E (VITAMIN E) 180 MG (400 UNITS) capsule Take 400 Units by mouth daily.   No facility-administered encounter medications on file as of 12/17/2020.    Allergies (verified) Ace inhibitors, Aspirin, Sulfa antibiotics, and Toprol xl [metoprolol succinate]   History: Past Medical History:  Diagnosis Date   Adenomatous colon polyp 06/05/11   Repeat 2018 per Dr. Carlean Purl   Allergic angioedema    ACE inhibitor/beta blocker   Diverticulosis  Gastroenteritis 2012   Headache(784.0)    Hyperlipidemia    Hypertension    Influenza A 02/2011   Intermittent vertigo    Microhematuria 01/06/2012   Dr. Orland Mustard, urologist in [place], did cysto and bladder bx 06/24/12 and it was unremarkable   Prostate cancer [facility]) 2007   Dr. Harlow Asa ([facility])  Pt is s/p brachytherapy 2011.  Jan 2015 PSA 0.6 ng/ml.   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 Caryl Comes)   Syncope    ? Malignant vasovagal syncope-Pacer placed    Past Surgical History:  Procedure Laterality Date   CATARACT EXTRACTION  1984, 1994   (lens implants bilat)   COLONOSCOPY  06/05/11   16mm sigmoid polyp removed (+adenomatous), moderate diverticulosis, internal hemorrhoids.  Repeat 2018.   CYSTOSTOMY W/ BLADDER BIOPSY  06/24/12   Benign   PACEMAKER INSERTION  02/2011   [employer]. V784 Dual chamber; place in [place], [place] when pt visiting there for holiday 02/2011.   RADIOACTIVE SEED IMPLANT     prostate 07/2009.  PSA dropped to below 2 after this (Dr. Harlow Asa at [facility] in [facility])   [place] ECHOCARDIOGRAM  02/2011   Normal   Family History  Problem Relation Age of Onset   Stroke Mother        during endarterectomy   Colon cancer Father    Diabetes Neg Hx    Social History   Socioeconomic History   Marital status: Married    Spouse name: [name]   Number of children: 2   Years of education: Not on file   Highest education level: Not on file  Occupational History   Occupation: Retired    Comment: Chartered loss adjuster for [employer]  Tobacco Use   Smoking status: Former    Types: Cigarettes    Quit date: 03/26/1964    Years since quitting: 56.7   Smokeless tobacco: Never  Vaping Use   Vaping Use: Never used  Substance and Sexual Activity   Alcohol use: No   Drug use: No   Sexual activity: Not on file  Other Topics Concern   Not on file  Social History Narrative   Married, retired from [employer] 2004.No T/A/Ds.Walks daily, push mows his yard.   Lives with his daughter   Wife on Dialysis    Social Determinants of Health   Financial Resource Strain: Low Risk    Difficulty of Paying Living Expenses: Not hard at all  Food Insecurity: No Food Insecurity   Worried About Charity fundraiser in the Last Year: Never true   Arboriculturist in the Last Year: Never true  Transportation Needs: No Transportation Needs   Lack of Transportation (Medical): No   Lack of  Transportation (Non-Medical): No  Physical Activity: Insufficiently Active   Days of Exercise per Week: 7 days   Minutes of Exercise per Session: 20 min  Stress: Stress Concern Present   Feeling of Stress : To some extent  Social Connections: Engineer, building services of Communication with Friends and Family: More than three times a week   Frequency of Social Gatherings with Friends and Family: More than three times a week   Attends Religious Services: More than 4 times per year   Active Member of [employer] or Organizations: Yes   Attends Music therapist: More than 4 times per year   Marital Status: Married    Tobacco  Counseling Counseling given: Not Answered   Clinical Intake:  Pre-visit preparation completed: Yes  Pain : 0-10 Pain Score: 3  Pain Type: Chronic pain Pain Location: Generalized Pain Orientation: Right, Left Pain Descriptors / Indicators: Aching Pain Onset: More than a month ago Pain Frequency: Intermittent     BMI - recorded: 23.39 Nutritional Status: BMI of 19-24  Normal Nutritional Risks: None Diabetes: No  How often do you need to have someone help you when you read instructions, pamphlets, or other written materials from your doctor or pharmacy?: 1 - Never  Diabetic? No  Interpreter Needed?: No  Information entered by :: Graziella Connery, LPN   Activities of Daily Living In your present state of health, do you have any difficulty performing the following activities: 12/17/2020  Hearing? N  Vision? N  Difficulty concentrating or making decisions? Y  Walking or climbing stairs? N  Dressing or bathing? N  Doing errands, shopping? N  Preparing Food and eating ? N  Using the Toilet? N  In the past six months, have you accidently leaked urine? N  Do you have problems with loss of bowel control? N  Managing your Medications? N  Managing your Finances? N  Housekeeping or managing your Housekeeping? N  Some recent data might be hidden     Patient Care Team: Shelda Pal, DO as PCP - General (Family Medicine) Deboraha Sprang, MD (Cardiology) Belva Bertin (Ophthalmology)  Indicate any recent Medical Services you may have received from other than [facility] providers in the past year (date may be approximate).     Assessment:   This is a routine wellness examination for Billy Burns.  Hearing/Vision screen Hearing Screening - Comments:: Declines hearing difficulties  Vision Screening - Comments:: Injured right eye in high school - Has to wear glasses to read only - up to date with annual eye exams with Dr Sabra Heck.  Dietary issues and exercise activities discussed: Current Exercise Habits: Home exercise routine, Type of exercise: walking, Time (Minutes): 20, Frequency (Times/Week): 7, Weekly Exercise (Minutes/Week): 140, Intensity: Mild, Exercise limited by: orthopedic condition(s);cardiac condition(s)   Goals Addressed             This Visit's Progress    Patient Stated       Wants to try to make sure memory doesn't get worse       Depression Screen PHQ 2/9 Scores 12/17/2020 01/15/2020 06/21/2016 01/02/2016 11/10/2014  PHQ - 2 Score 1 0 0 0 0    Fall Risk Fall Risk  12/17/2020 06/21/2016 01/02/2016 11/10/2014  Falls in the past year? 0 No No No  Number falls in past yr: 0 - - -  Injury with Fall? 0 - - -  Risk for fall due to : History of fall(s);Impaired vision;Orthopedic patient - - -  Follow up Falls prevention discussed;Education provided - - -    FALL RISK PREVENTION PERTAINING TO THE HOME:  Any stairs in or around the home? Yes  If so, are there any without handrails? No  Home free of loose throw rugs in walkways, pet beds, electrical cords, etc? Yes  Adequate lighting in your home to reduce risk of falls? Yes   ASSISTIVE DEVICES UTILIZED TO PREVENT FALLS:  Life alert? No  Use of a cane, walker or w/c?  Sometimes if he has vertigo Grab bars in the bathroom? No  Shower chair or bench in shower?  Yes  Elevated toilet seat or a handicapped toilet? Yes   TIMED UP  AND GO:  Was the test performed? No . Telephonic visit  Cognitive Function:     6CIT Screen 12/17/2020  What Year? 0 points  What month? 0 points  What time? 0 points  Count back from 20 0 points  Months in reverse 0 points  Repeat phrase 4 points  Total Score 4    Immunizations Immunization History  Administered Date(s) Administered   Fluad Quad(high Dose 65+) 01/15/2020   Influenza Split 01/14/2012, 01/05/2019   Influenza, High Dose Seasonal PF 03/14/2015, 02/12/2018   Influenza,inj,Quad PF,6+ Mos 01/08/2013   Moderna Sars-Covid-2 Vaccination 06/03/2019, 07/01/2019, 11/14/2019   Pneumococcal Conjugate-13 05/15/2013   Pneumococcal Polysaccharide-23 11/14/2007   Td 10/02/2005   Tdap 09/06/2016   Zoster, Live 11/14/2010    TDAP status: Up to date  Flu Vaccine status: Due, Education has been provided regarding the importance of this vaccine. Advised may receive this vaccine at local pharmacy or Health Dept. Aware to provide a copy of the vaccination record if obtained from local pharmacy or Health Dept. Verbalized acceptance and understanding.  Pneumococcal vaccine status: Up to date  Covid-19 vaccine status: Completed vaccines  Qualifies for Shingles Vaccine? Yes   Zostavax completed Yes   Shingrix Completed?: No.    Education has been provided regarding the importance of this vaccine. Patient has been advised to call insurance company to determine out of pocket expense if they have not yet received this vaccine. Advised may also receive vaccine at local pharmacy or Health Dept. Verbalized acceptance and understanding.  Screening Tests Health Maintenance  Topic Date Due   Zoster Vaccines- Shingrix (1 of 2) Never done   COVID-19 Vaccine (4 - Booster for Moderna series) 02/06/2020   INFLUENZA VACCINE  10/24/2020   TETANUS/TDAP  09/07/2026   HPV VACCINES  Aged Out    Health Maintenance  Health  Maintenance Due  Topic Date Due   Zoster Vaccines- Shingrix (1 of 2) Never done   COVID-19 Vaccine (4 - Booster for Moderna series) 02/06/2020   INFLUENZA VACCINE  10/24/2020    Colorectal cancer screening: No longer required.   Lung Cancer Screening: (Low Dose CT Chest recommended if Age 45-80 years, 30 pack-year currently smoking OR have quit w/in 15years.) does not qualify.   Additional Screening:  Hepatitis C Screening: does not qualify  Vision Screening: Recommended annual ophthalmology exams for early detection of glaucoma and other disorders of the eye. Is the patient up to date with their annual eye exam?  Yes  Who is the provider or what is the name of the office in which the patient attends annual eye exams? [facility]ra Heck If pt is not established with a provider, would they like to be referred to a provider to establish care? No .   Dental Screening: Recommended annual dental exams for proper oral hygiene  Community Resource Referral / Chronic Care Management: CRR required this visit?  No   CCM required this visit?  No      Plan:     I have personally reviewed and noted the following in the patient's chart:   Medical and social history Use of alcohol, tobacco or illicit drugs  Current medications and supplements including opioid prescriptions. Patient is not currently taking opioid prescriptions. Functional ability and status Nutritional status Physical activity Advanced directives List of other physicians Hospitalizations, surgeries, and ER visits in previous 12 months Vitals Screenings to include cognitive, depression, and falls Referrals and appointments  In addition, I have reviewed and discussed with patient certain  preventive protocols, quality metrics, and best practice recommendations. A written personalized care plan for preventive services as well as general preventive health recommendations were provided to patient.     Sandrea Hammond, LPN   3/79/[id number]    Nurse Notes: None

## 2021-01-07 ENCOUNTER — Other Ambulatory Visit: Payer: Self-pay | Admitting: Family Medicine

## 2021-01-16 ENCOUNTER — Other Ambulatory Visit: Payer: Self-pay

## 2021-01-17 ENCOUNTER — Encounter: Payer: Self-pay | Admitting: Family Medicine

## 2021-01-17 ENCOUNTER — Ambulatory Visit (INDEPENDENT_AMBULATORY_CARE_PROVIDER_SITE_OTHER): Payer: Medicare HMO | Admitting: Family Medicine

## 2021-01-17 VITALS — BP 141/80 | HR 78 | Temp 98.0°F | Ht 69.0 in | Wt 163.4 lb

## 2021-01-17 DIAGNOSIS — Z0000 Encounter for general adult medical examination without abnormal findings: Secondary | ICD-10-CM | POA: Diagnosis not present

## 2021-01-17 DIAGNOSIS — J3489 Other specified disorders of nose and nasal sinuses: Secondary | ICD-10-CM

## 2021-01-17 DIAGNOSIS — Z23 Encounter for immunization: Secondary | ICD-10-CM | POA: Diagnosis not present

## 2021-01-17 DIAGNOSIS — R413 Other amnesia: Secondary | ICD-10-CM | POA: Diagnosis not present

## 2021-01-17 DIAGNOSIS — E785 Hyperlipidemia, unspecified: Secondary | ICD-10-CM

## 2021-01-17 NOTE — Patient Instructions (Addendum)
Give [place] 2-3 business days to get the results of your labs back.   Keep the diet clean and stay active.  The new Shingrix vaccine (for shingles) is a 2 shot series. It can make people feel low energy, achy and almost like they have the flu for 48 hours after injection. Please plan accordingly when deciding on when to get this shot. Call your pharmacy to get this. The second shot of the series is less severe regarding the side effects, but it still lasts 48 hours.   I recommend getting the updated bivalent covid vaccination booster at your convenience.   If labs are normal, we can check a CT scan of your head. If things change, let me know and I will order it.   Consider Flonase to help with the runny nose.   Let us know if you need anything.

## 2021-01-17 NOTE — Addendum Note (Signed)
Addended by: Sharon Seller B on: 01/17/2021 02:17 PM   Modules accepted: Orders

## 2021-01-17 NOTE — Progress Notes (Signed)
Chief Complaint  Patient presents with   Annual Exam    Well Male Billy Burns is here for a complete physical.   His last physical was >1 year ago.  Current diet: in general, a "healthy" diet.   Current exercise: walking Weight trend: stable Fatigue out of ordinary? Yes.  Seat belt? Yes.    Health maintenance Shingrix- No Tetanus- Yes Pneumonia vaccine- Yes  Memory Steadily getting worse over the past 2 years. Stays physically active. Has trouble remembering where things are. No word finding difficulties. Sleeps fine when taking L tryptophan.  Nose and eyes are having more fluid/running over past 6 mo. No pain, vision changes, fevers, itching, congestion. Has not tried anything thus far. Friend stuck finger in eye several years ago.   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 Carlean Purl   Allergic angioedema    ACE inhibitor/beta blocker   Diverticulosis    Gastroenteritis 2012   Headache(784.0)    Hyperlipidemia    Hypertension    Influenza A 02/2011   Intermittent vertigo    Microhematuria 01/06/2012   Dr. Orland Mustard, urologist in [place], did cysto and bladder bx 06/24/12 and it was unremarkable   Prostate cancer [facility]) 2007   Dr. Harlow Asa ([facility])  Pt is s/p brachytherapy 2011.  Jan 2015 PSA 0.6 ng/ml.   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 Caryl Comes)   Syncope    ? Malignant vasovagal syncope-Pacer placed     Past Surgical History:  Procedure Laterality Date   CATARACT EXTRACTION  1984, 1994   (lens implants bilat)   COLONOSCOPY  06/05/11   33mm sigmoid polyp removed (+adenomatous), moderate diverticulosis, internal hemorrhoids.  Repeat 2018.   CYSTOSTOMY W/ BLADDER BIOPSY  06/24/12   Benign   PACEMAKER INSERTION  02/2011   [employer]. L892 Dual chamber; place in [place], [place] when pt visiting there for holiday 02/2011.   RADIOACTIVE SEED IMPLANT     prostate 07/2009.  PSA  dropped to below 2 after this (Dr. Harlow Asa at [facility])   [place] ECHOCARDIOGRAM  02/2011   Normal    Medications  Current Outpatient Medications on File Prior to Visit  Medication Sig Dispense Refill   acetaminophen (TYLENOL) 325 MG tablet Take 325 mg by mouth every 6 (six) hours as needed for moderate pain.      Alfalfa 500 MG TABS Take 9-12 tablets by mouth daily. 9-12 TABS/DAY      b complex vitamins tablet Take 1 tablet by mouth daily.     carvedilol (COREG) 25 MG tablet TAKE 1 TABLET BY MOUTH TWICE DAILY WITH A MEAL 180 tablet 0   cloNIDine (CATAPRES) 0.1 MG tablet Take 1 tablet by mouth twice daily 180 tablet 0   dimenhyDRINATE (DRAMAMINE) 50 MG tablet Take 50 mg by mouth daily as needed.     Flaxseed, Linseed, (FLAX SEED OIL PO) Take 1,000 mg by mouth daily.      NON FORMULARY Take 1 tablet by mouth in the morning and at bedtime. Prostate Ess plus     OVER THE COUNTER MEDICATION Take 2 tablets by mouth every evening. HerbLax     Probiotic Product (PROBIOTIC DAILY PO) Take 1 capsule by mouth daily.     vitamin C (ASCORBIC ACID) 500 MG tablet Take 500 mg by mouth 2 (two) times daily.  vitamin E 180 MG (400 UNITS) capsule Take 400 Units by mouth daily.     Allergies Allergies  Allergen Reactions   Ace Inhibitors Other (See Comments)    REACTION: Angioedema   Aspirin Other (See Comments)    REACTION: Stomach Discomfort/TAKES 81 MG BUT CANT TAKE 325 MG ASA PER PT.   Sulfa Antibiotics     Pt does not remember - happened in high school   Toprol Xl [Metoprolol Succinate] Other (See Comments)    Angioedema    Family History Family History  Problem Relation Age of Onset   Stroke Mother        during endarterectomy   Colon cancer Father    Diabetes Neg Hx     Review of Systems: Constitutional:  no fevers Eye:  no recent significant change in vision Ears:  No changes in hearing Nose/Mouth/Throat:  no complaints of  nasal congestion, no sore throat Cardiovascular: no chest pain Respiratory:  No shortness of breath Gastrointestinal:  No change in bowel habits GU:  No frequency Integumentary:  no abnormal skin lesions reported Neurologic:  As noted above Endocrine:  denies unexplained weight changes  Exam BP (!) 141/80   Pulse 78   Temp 98 F (36.7 C) (Oral)   Ht 5\' 9"  (1.753 m)   Wt 163 lb 6 oz (74.1 kg)   SpO2 97%   BMI 24.13 kg/m  General:  well developed, well nourished, in no apparent distress Skin:  no significant moles, warts, or growths Head:  no masses, lesions, or tenderness Eyes:  pupils equal and round, sclera anicteric without injection Ears:  canals without lesions, TMs shiny without retraction, no obvious effusion, no erythema Nose:  nares patent, septum midline, mucosa normal Throat/Pharynx:  lips and gingiva without lesion; tongue and uvula midline; non-inflamed pharynx; no exudates or postnasal drainage Lungs:  clear to auscultation, breath sounds equal bilaterally, no respiratory distress Cardio:  regular rate and rhythm, no LE edema or bruits Rectal: Deferred GI: BS+, S, NT, ND, no masses or organomegaly Musculoskeletal:  symmetrical muscle groups noted without atrophy or deformity Neuro:  gait normal; deep tendon reflexes normal and symmetric Psych: well oriented with normal range of affect and appropriate judgment/insight  Assessment and Plan  Well adult exam  Hyperlipidemia, unspecified hyperlipidemia type - Plan: Lipid panel, Comprehensive metabolic panel  Memory deficit - Plan: CBC, B12, TSH  Rhinorrhea   Well 80 y.o. male. Counseled on diet and exercise. Other orders as above. New problem, uncertain prog. Will ck labs. Consider CT head and neuropsych referral if nml.  Suggested Flonase for runny nose.  Shingrix and covid bivalent booster rec'd. Flu shot today.  Follow up in 6 mo or prn.  The patient voiced understanding and agreement to the  plan.  [place], DO 01/17/21 2:14 PM

## 2021-01-18 LAB — COMPREHENSIVE METABOLIC PANEL
ALT: 19 U/L (ref 0–53)
AST: 23 U/L (ref 0–37)
Albumin: 4.3 g/dL (ref 3.5–5.2)
Alkaline Phosphatase: 104 U/L (ref 39–117)
BUN: 18 mg/dL (ref 6–23)
CO2: 25 mEq/L (ref 19–32)
Calcium: 9.3 mg/dL (ref 8.4–10.5)
Chloride: 106 mEq/L (ref 96–112)
Creatinine, Ser: 1.15 mg/dL (ref 0.40–1.50)
GFR: 59.56 mL/min — ABNORMAL LOW (ref >60.00)
Glucose, Bld: 103 mg/dL — ABNORMAL HIGH (ref 70–99)
Potassium: 4.1 mEq/L (ref 3.5–5.1)
Sodium: 139 mEq/L (ref 135–145)
Total Bilirubin: 0.5 mg/dL (ref 0.2–1.2)
Total Protein: 7.1 g/dL (ref 6.0–8.3)

## 2021-01-18 LAB — CBC
HCT: 42.8 % (ref 39.0–52.0)
Hemoglobin: 14.1 g/dL (ref 13.0–17.0)
MCHC: 32.9 g/dL (ref 30.0–36.0)
MCV: 90.1 fl (ref 78.0–100.0)
Platelets: 292 10*3/uL (ref 150.0–400.0)
RBC: 4.75 Mil/uL (ref 4.22–5.81)
RDW: 14.9 % (ref 11.5–15.5)
WBC: 7.1 10*3/uL (ref 4.0–10.5)

## 2021-01-18 LAB — LIPID PANEL
Cholesterol: 183 mg/dL (ref 0–200)
HDL: 37.8 mg/dL — ABNORMAL LOW (ref >39.00)
NonHDL: 145.17
Total CHOL/HDL Ratio: 5
Triglycerides: 356 mg/dL — ABNORMAL HIGH (ref 0.0–149.0)
VLDL: 71.2 mg/dL — ABNORMAL HIGH (ref 0.0–40.0)

## 2021-01-18 LAB — VITAMIN B12: Vitamin B-12: 1286 pg/mL — ABNORMAL HIGH (ref 211–911)

## 2021-01-18 LAB — TSH: TSH: 1.46 u[IU]/mL (ref 0.35–5.50)

## 2021-01-18 LAB — LDL CHOLESTEROL, DIRECT: Direct LDL: 119 mg/dL

## 2021-01-19 NOTE — Addendum Note (Signed)
Addended by: Jeronimo Greaves on: 01/19/2021 01:36 PM   Modules accepted: Orders

## 2021-01-20 ENCOUNTER — Other Ambulatory Visit: Payer: Medicare HMO

## 2021-02-21 ENCOUNTER — Other Ambulatory Visit (INDEPENDENT_AMBULATORY_CARE_PROVIDER_SITE_OTHER): Payer: Medicare HMO

## 2021-02-21 ENCOUNTER — Other Ambulatory Visit: Payer: Self-pay

## 2021-02-21 DIAGNOSIS — E785 Hyperlipidemia, unspecified: Secondary | ICD-10-CM

## 2021-02-21 LAB — LIPID PANEL
Cholesterol: 186 mg/dL (ref 0–200)
HDL: 48.7 mg/dL (ref >39.00)
LDL Cholesterol: 119 mg/dL — ABNORMAL HIGH (ref 0–99)
NonHDL: 137.19
Total CHOL/HDL Ratio: 4
Triglycerides: 90 mg/dL (ref 0.0–149.0)
VLDL: 18 mg/dL (ref 0.0–40.0)

## 2021-04-13 ENCOUNTER — Other Ambulatory Visit: Payer: Self-pay | Admitting: Family Medicine

## 2021-07-18 ENCOUNTER — Other Ambulatory Visit: Payer: Self-pay | Admitting: Family Medicine

## 2021-07-18 ENCOUNTER — Ambulatory Visit (INDEPENDENT_AMBULATORY_CARE_PROVIDER_SITE_OTHER): Payer: Medicare HMO | Admitting: Family Medicine

## 2021-07-18 ENCOUNTER — Encounter: Payer: Self-pay | Admitting: Family Medicine

## 2021-07-18 VITALS — BP 118/76 | HR 71 | Temp 97.3°F | Ht 69.0 in | Wt 169.0 lb

## 2021-07-18 DIAGNOSIS — I10 Essential (primary) hypertension: Secondary | ICD-10-CM | POA: Diagnosis not present

## 2021-07-18 DIAGNOSIS — R5381 Other malaise: Secondary | ICD-10-CM

## 2021-07-18 DIAGNOSIS — E785 Hyperlipidemia, unspecified: Secondary | ICD-10-CM

## 2021-07-18 DIAGNOSIS — R413 Other amnesia: Secondary | ICD-10-CM

## 2021-07-18 LAB — COMPREHENSIVE METABOLIC PANEL
ALT: 20 U/L (ref 0–53)
AST: 21 U/L (ref 0–37)
Albumin: 4.3 g/dL (ref 3.5–5.2)
Alkaline Phosphatase: 88 U/L (ref 39–117)
BUN: 18 mg/dL (ref 6–23)
CO2: 28 mEq/L (ref 19–32)
Calcium: 9 mg/dL (ref 8.4–10.5)
Chloride: 104 mEq/L (ref 96–112)
Creatinine, Ser: 1.07 mg/dL (ref 0.40–1.50)
GFR: 64.72 mL/min (ref >60.00)
Glucose, Bld: 90 mg/dL (ref 70–99)
Potassium: 3.9 mEq/L (ref 3.5–5.1)
Sodium: 140 mEq/L (ref 135–145)
Total Bilirubin: 0.7 mg/dL (ref 0.2–1.2)
Total Protein: 7 g/dL (ref 6.0–8.3)

## 2021-07-18 LAB — VITAMIN B12: Vitamin B-12: 1177 pg/mL — ABNORMAL HIGH (ref 211–911)

## 2021-07-18 LAB — LIPID PANEL
Cholesterol: 188 mg/dL (ref 0–200)
HDL: 44.3 mg/dL (ref >39.00)
LDL Cholesterol: 104 mg/dL — ABNORMAL HIGH (ref 0–99)
NonHDL: 143.82
Total CHOL/HDL Ratio: 4
Triglycerides: 200 mg/dL — ABNORMAL HIGH (ref 0.0–149.0)
VLDL: 40 mg/dL (ref 0.0–40.0)

## 2021-07-18 LAB — CBC
HCT: 42.1 % (ref 39.0–52.0)
Hemoglobin: 14.2 g/dL (ref 13.0–17.0)
MCHC: 33.6 g/dL (ref 30.0–36.0)
MCV: 89.2 fl (ref 78.0–100.0)
Platelets: 290 10*3/uL (ref 150.0–400.0)
RBC: 4.72 Mil/uL (ref 4.22–5.81)
RDW: 14.3 % (ref 11.5–15.5)
WBC: 6.6 10*3/uL (ref 4.0–10.5)

## 2021-07-18 LAB — TSH: TSH: 1.69 u[IU]/mL (ref 0.35–5.50)

## 2021-07-18 NOTE — Patient Instructions (Addendum)
Keep the diet clean and stay active. ? ?Give [place] 2-3 business days to get the results of your labs back. If labs are normal, we will check a scan of your head. If that is normal, we will proceed with a neuropsychology evaluation.  ? ?Aim to do some physical exertion for 150 minutes per week. This is typically divided into 5 days per week, 30 minutes per day. The activity should be enough to get your heart rate up. Anything is better than nothing if you have time constraints. ? ?The Shingrix vaccine (for shingles) is a 2 shot series spaced 2-6 months apart. It can make people feel low energy, achy and almost like they have the flu for 48 hours after injection. 1/5 people can have nausea and/or vomiting. Please plan accordingly when deciding on when to get this shot. Call our office for a nurse visit appointment to get this. The second shot of the series is less severe regarding the side effects, but it still lasts 48 hours.  ? ?If you do not hear anything about your referral in the next 1-2 weeks, call our office and ask for an update. ? ?Let us know if you need anything. ?

## 2021-07-18 NOTE — Progress Notes (Signed)
Chief Complaint  ?Patient presents with  ? Follow-up  ?  6 month  ? ? ?Subjective ?Billy Burns is a 81 y.o. male who presents for hypertension follow up. ?He  does not routinely  monitor home blood pressures. ?He is compliant with medications- clonidine 0.1 mg bid, Coreg 25 mg bid. ?Patient has these side effects of medication: none ?He is adhering to a healthy diet overall. ?Current exercise: walking ?No CP or SOB.  ? ?Short term memory ?Notices his short term memory has been worse. He will misplace his items frequently. Over the last 5-6 months, he has been noticing things getting worse. Wife/daughter notice it more so than he does. He will also forget things they tell him. No issues driving or forgetting what he is doing for errands. Behavior seems to unchanged. Sleeps well. Does not feel anxious or depressed.  ? ?Weakness ?Has to grab something when he gets up. Feels both legs are weak. Has noticed over past 3 mo. No pain, numbness, tingling.  ?  ?Past Medical History:  ?Diagnosis Date  ? Adenomatous colon polyp 06/05/11  ? Repeat 2018 per Dr. Carlean Purl  ? Allergic angioedema   ? ACE inhibitor/beta blocker  ? Diverticulosis   ? Gastroenteritis 2012  ? Headache(784.0)   ? Hyperlipidemia   ? Hypertension   ? Influenza A 02/2011  ? Intermittent vertigo   ? Microhematuria 01/06/2012  ? Dr. Orland Mustard, urologist in [place], did cysto and bladder bx 06/24/12 and it was unremarkable  ? Prostate cancer [facility]) 2007  ? Dr. Harlow Asa ([facility])  Pt is s/p brachytherapy 2011.  Jan 2015 PSA 0.6 ng/ml.  ? Sinus arrest   ? 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 Caryl Comes)  ? Syncope   ? ? Malignant vasovagal syncope-Pacer placed  ? ? ?Exam ?BP 118/76   Pulse 71   Temp (!) 97.3 ?F (36.3 ?C) (Oral)   Ht '5\' 9"'$  (1.753 m)   Wt 169 lb (76.7 kg)   SpO2 98%   BMI 24.96 kg/m?  ?General:  well developed, well nourished, in no apparent distress ?Heart: RRR, no bruits, no LE edema ?Lungs: clear to auscultation, no accessory muscle  use ?Neuro: Gait is slow, overall steady but cautious, DTR's equal and symmetric throughout, no clonus ?Psych: well oriented with normal range of affect and appropriate judgment/insight ? ?Essential hypertension ? ?Hyperlipidemia, unspecified hyperlipidemia type - Plan: Lipid panel ? ?Memory deficit - Plan: CBC, Comprehensive metabolic panel, TSH, S85 ? ?Chronic, stable. Cont clonidine 0.1 mg bid, Coreg 25 mg bid. Counseled on diet and exercise. ?Monitor lipids.  ?New problem, uncertain prog. Ck labs, if neg will ck CT head, if neg will refer to neuropsych.  ?New issues, will encourage routine exercise. PT.  ?F/u in 6 mo for CPE. ?The patient voiced understanding and agreement to the plan. ? ?Shelda Pal, DO ?07/18/21  ?11:05 AM ? ?

## 2021-07-19 ENCOUNTER — Other Ambulatory Visit: Payer: Self-pay | Admitting: Family Medicine

## 2021-07-19 DIAGNOSIS — E785 Hyperlipidemia, unspecified: Secondary | ICD-10-CM

## 2021-08-17 DIAGNOSIS — Z87891 Personal history of nicotine dependence: Secondary | ICD-10-CM | POA: Diagnosis not present

## 2021-08-17 DIAGNOSIS — Z950 Presence of cardiac pacemaker: Secondary | ICD-10-CM | POA: Diagnosis not present

## 2021-08-17 DIAGNOSIS — K5900 Constipation, unspecified: Secondary | ICD-10-CM | POA: Diagnosis not present

## 2021-08-17 DIAGNOSIS — M1990 Unspecified osteoarthritis, unspecified site: Secondary | ICD-10-CM | POA: Diagnosis not present

## 2021-08-17 DIAGNOSIS — I10 Essential (primary) hypertension: Secondary | ICD-10-CM | POA: Diagnosis not present

## 2021-08-17 DIAGNOSIS — I495 Sick sinus syndrome: Secondary | ICD-10-CM | POA: Diagnosis not present

## 2021-08-31 ENCOUNTER — Ambulatory Visit (HOSPITAL_BASED_OUTPATIENT_CLINIC_OR_DEPARTMENT_OTHER)
Admission: RE | Admit: 2021-08-31 | Discharge: 2021-08-31 | Disposition: A | Discharge status: Home / Self Care | Payer: Medicare HMO | Source: Ambulatory Visit | Attending: Family Medicine | Admitting: Family Medicine

## 2021-08-31 ENCOUNTER — Other Ambulatory Visit (INDEPENDENT_AMBULATORY_CARE_PROVIDER_SITE_OTHER): Payer: Medicare HMO

## 2021-08-31 ENCOUNTER — Other Ambulatory Visit: Payer: Self-pay | Admitting: *Deleted

## 2021-08-31 ENCOUNTER — Encounter (HOSPITAL_BASED_OUTPATIENT_CLINIC_OR_DEPARTMENT_OTHER): Payer: Self-pay

## 2021-08-31 DIAGNOSIS — E785 Hyperlipidemia, unspecified: Secondary | ICD-10-CM

## 2021-08-31 DIAGNOSIS — R413 Other amnesia: Secondary | ICD-10-CM

## 2021-08-31 LAB — LIPID PANEL
Cholesterol: 175 mg/dL (ref 0–200)
HDL: 45.8 mg/dL (ref >39.00)
LDL Cholesterol: 112 mg/dL — ABNORMAL HIGH (ref 0–99)
NonHDL: 129.29
Total CHOL/HDL Ratio: 4
Triglycerides: 87 mg/dL (ref 0.0–149.0)
VLDL: 17.4 mg/dL (ref 0.0–40.0)

## 2021-09-30 ENCOUNTER — Other Ambulatory Visit: Payer: Self-pay | Admitting: Family Medicine

## 2021-12-09 IMAGING — CT CT ANGIO HEAD
1 of 11 series · 5 of 35 positions shown · IV contrast (omnipaque)
Comparison: Noncontrast head CT 10/02/2006

CLINICAL DATA: Vertigo, central. Additional history provided:
7-year-old male with dizziness and consistent vertigo, vertigo
versus posterior circulation CVA

EXAM:
CT ANGIOGRAPHY HEAD AND NECK
TECHNIQUE: Multidetector CT imaging of the head and neck was performed using
the standard protocol during bolus administration of intravenous
contrast. Multiplanar CT image reconstructions and MIPs were
obtained to evaluate the vascular anatomy. Carotid stenosis
measurements (when applicable) are obtained utilizing NASCET
criteria, using the distal internal carotid diameter as the
denominator.
CONTRAST:  75mL OMNIPAQUE IOHEXOL 350 MG/ML SOLN

[Series 12: ax thins · axial · 0.39mm/px · z∈[-293,-65]mm · 5 of 343 slices shown]
[im 58/343  soft-tissue]
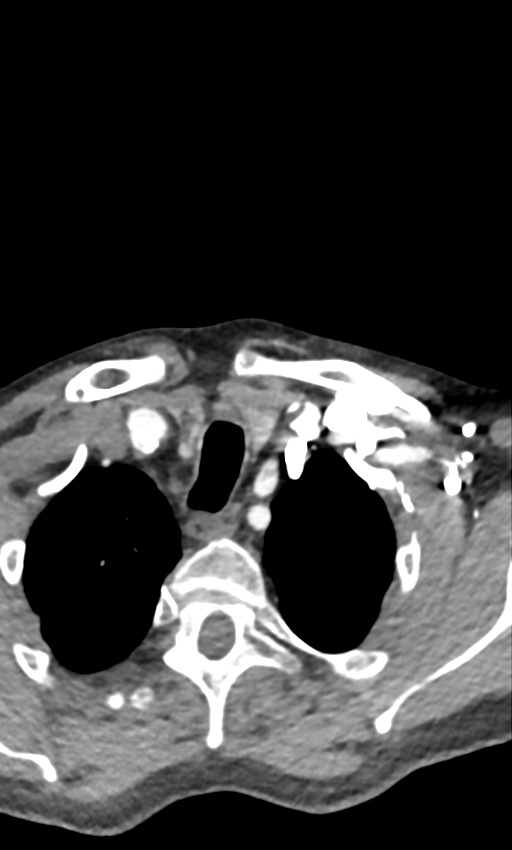
[im 115/343  bone]
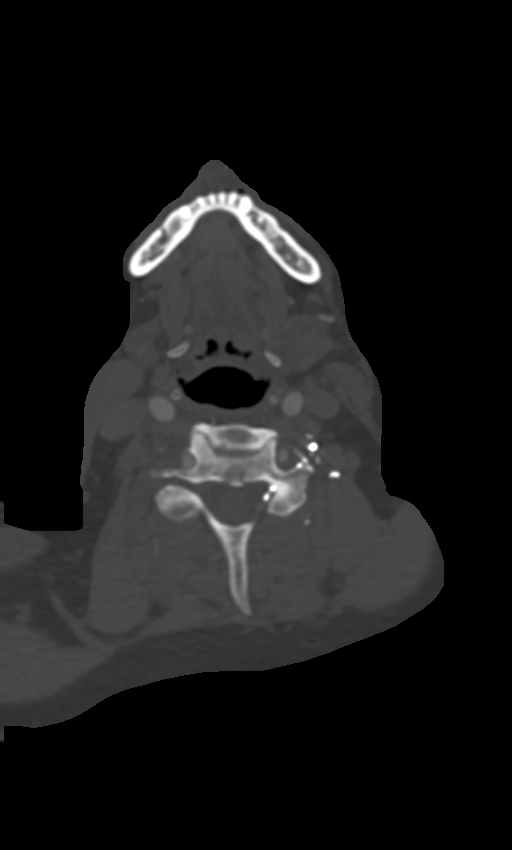
[im 172/343  soft-tissue]
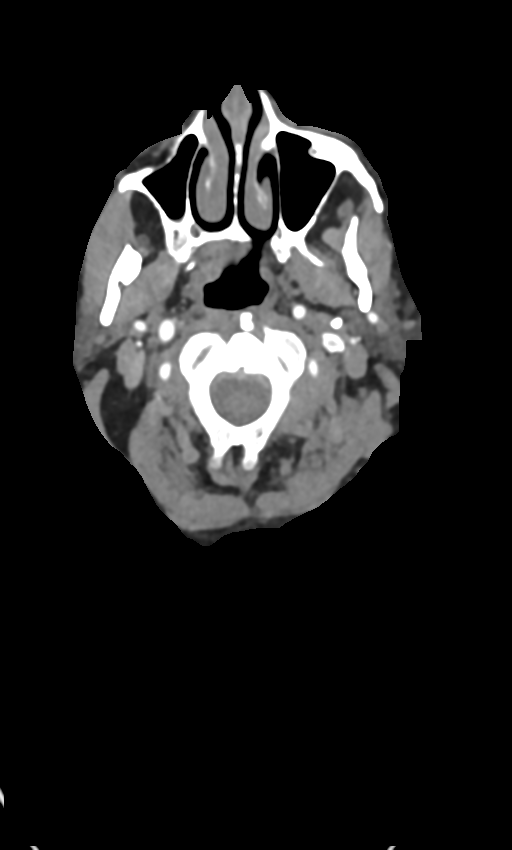
[im 229/343  bone]
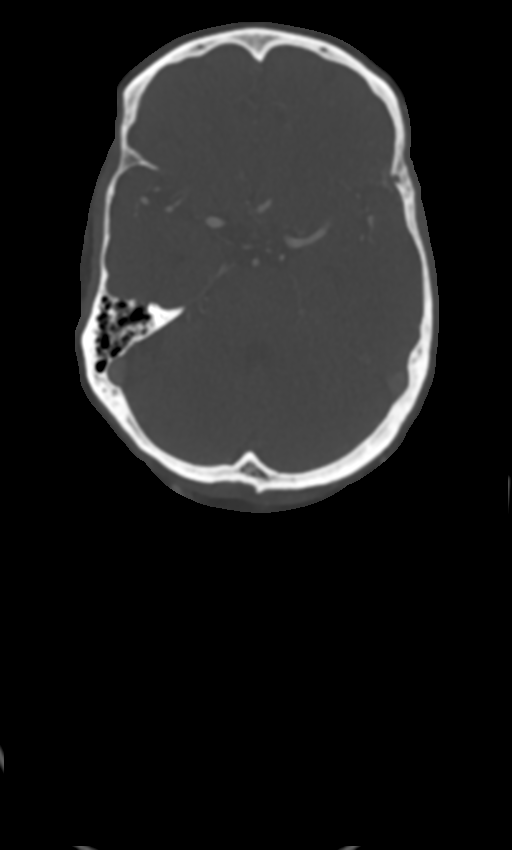
[im 286/343  soft-tissue]
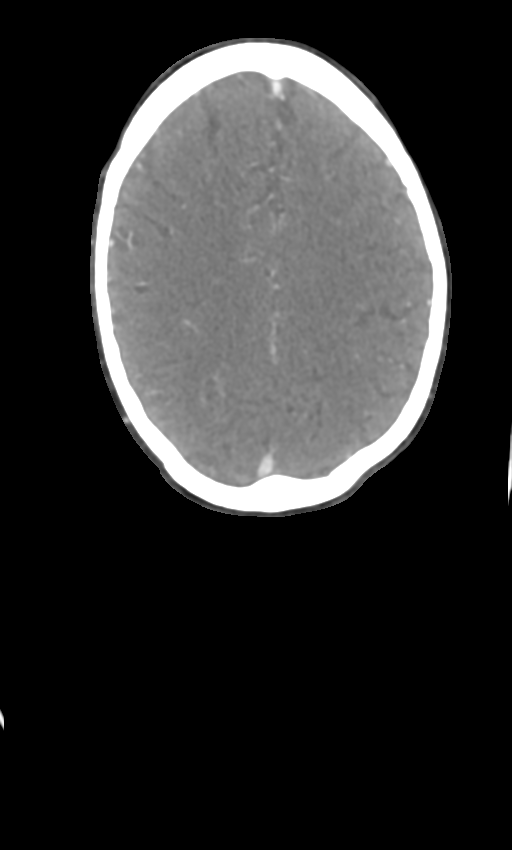

[5 of 35 positions shown; findings below may reference images not displayed]

FINDINGS: CT HEAD FINDINGS

Brain:

No evidence of acute intracranial hemorrhage.

No demarcated cortical infarction.

No evidence of intracranial mass.

No midline shift or extra-axial fluid collection.

Mild generalized parenchymal atrophy, progressed as compared to
prior CT 10/02/2006

Vascular: Reported separately

Skull: Normal. Negative for fracture or focal lesion.

Sinuses: No significant paranasal sinus disease or mastoid effusion
at the imaged levels.

Orbits: Visualized orbits demonstrate no acute abnormality.

Review of the MIP images confirms the above findings

CTA NECK FINDINGS

Aortic arch: Standard aortic branching. Scattered calcified plaque
within the visualized aortic arch and proximal major branch vessels
of the neck. No significant innominate or proximal subclavian artery
stenosis.

Right carotid system: CCA and ICA patent within the neck without
significant stenosis (50% or greater). Mild calcified plaque within
the carotid bifurcation and proximal ICA.

Left carotid system: CCA and ICA patent within the neck without
significant stenosis (50% or greater). Mild calcified plaque within
the proximal ICA.

Vertebral arteries: Codominant. The right vertebral artery is patent
throughout the neck without significant stenosis. Streak artifact
from a dense left-sided contrast bolus limits evaluation of the V1
left vertebral artery. The left vertebral artery is otherwise patent
within the neck without significant stenosis.

Skeleton: No acute bony abnormality. Cervical spondylosis without
high-grade bony spinal canal stenosis. Degenerative fusion across
the C2-C3 and C4-C5 disc spaces.

Other neck: No neck mass or cervical lymphadenopathy.

Upper chest: No consolidation within the imaged lung apices.

Review of the MIP images confirms the above findings

CTA HEAD FINDINGS

Anterior circulation:

The intracranial internal carotid arteries are patent bilaterally
with minimal scattered plaque but no significant stenosis.

The M1 middle cerebral arteries are patent bilaterally without
significant stenosis. No M2 proximal branch occlusion or high-grade
proximal arterial stenosis is identified.

The anterior cerebral arteries are patent without high-grade
proximal stenosis. The A1 left ACA is hypoplastic.

No intracranial aneurysm is identified.

Posterior circulation:

The intracranial vertebral arteries are patent bilaterally without
significant stenosis. The basilar artery is patent without
significant stenosis. Predominantly fetal origin of the left
posterior cerebral artery. The bilateral posterior cerebral arteries
are patent without high-grade proximal stenosis. Mild/moderate focal
stenosis within the distal P2 right PCA (series 15, image 22). A
right posterior communicating artery is not definitively identified
and may be hypoplastic or absent.

Venous sinuses: Within limitations of contrast timing, no convincing
thrombus.

Anatomic variants: As described

Review of the MIP images confirms the above findings
IMPRESSION: CT head:

1. No evidence of acute intracranial abnormality.
2. Mild generalized parenchymal atrophy, progressed as compared to
head CT 10/02/2006.

CTA neck:

1. The bilateral common and internal carotid arteries are patent
within the neck without significant stenosis. Mild calcified plaque
within the carotid systems.
2. The right vertebral artery is patent within the neck without
significant stenosis.
3. Streak artifact from a dense left-sided contrast bolus limits
evaluation of the V1 left vertebral artery. The left vertebral
artery is otherwise patent within the neck without significant
stenosis.

CTA head:

1. No intracranial large vessel occlusion or proximal high-grade
arterial stenosis.
2. Mild/moderate focal stenosis within the distal P2 right posterior
cerebral artery.
3. Mild atherosclerotic disease within the intracranial internal
carotid arteries without stenosis.

## 2021-12-10 IMAGING — DX DG CHEST 1V PORT
1 series · 1 of 1 positions shown · non-contrast
Comparison: 10/02/2006

CLINICAL DATA: Leukocytosis.

EXAM:
PORTABLE CHEST 1 VIEW

[chest]
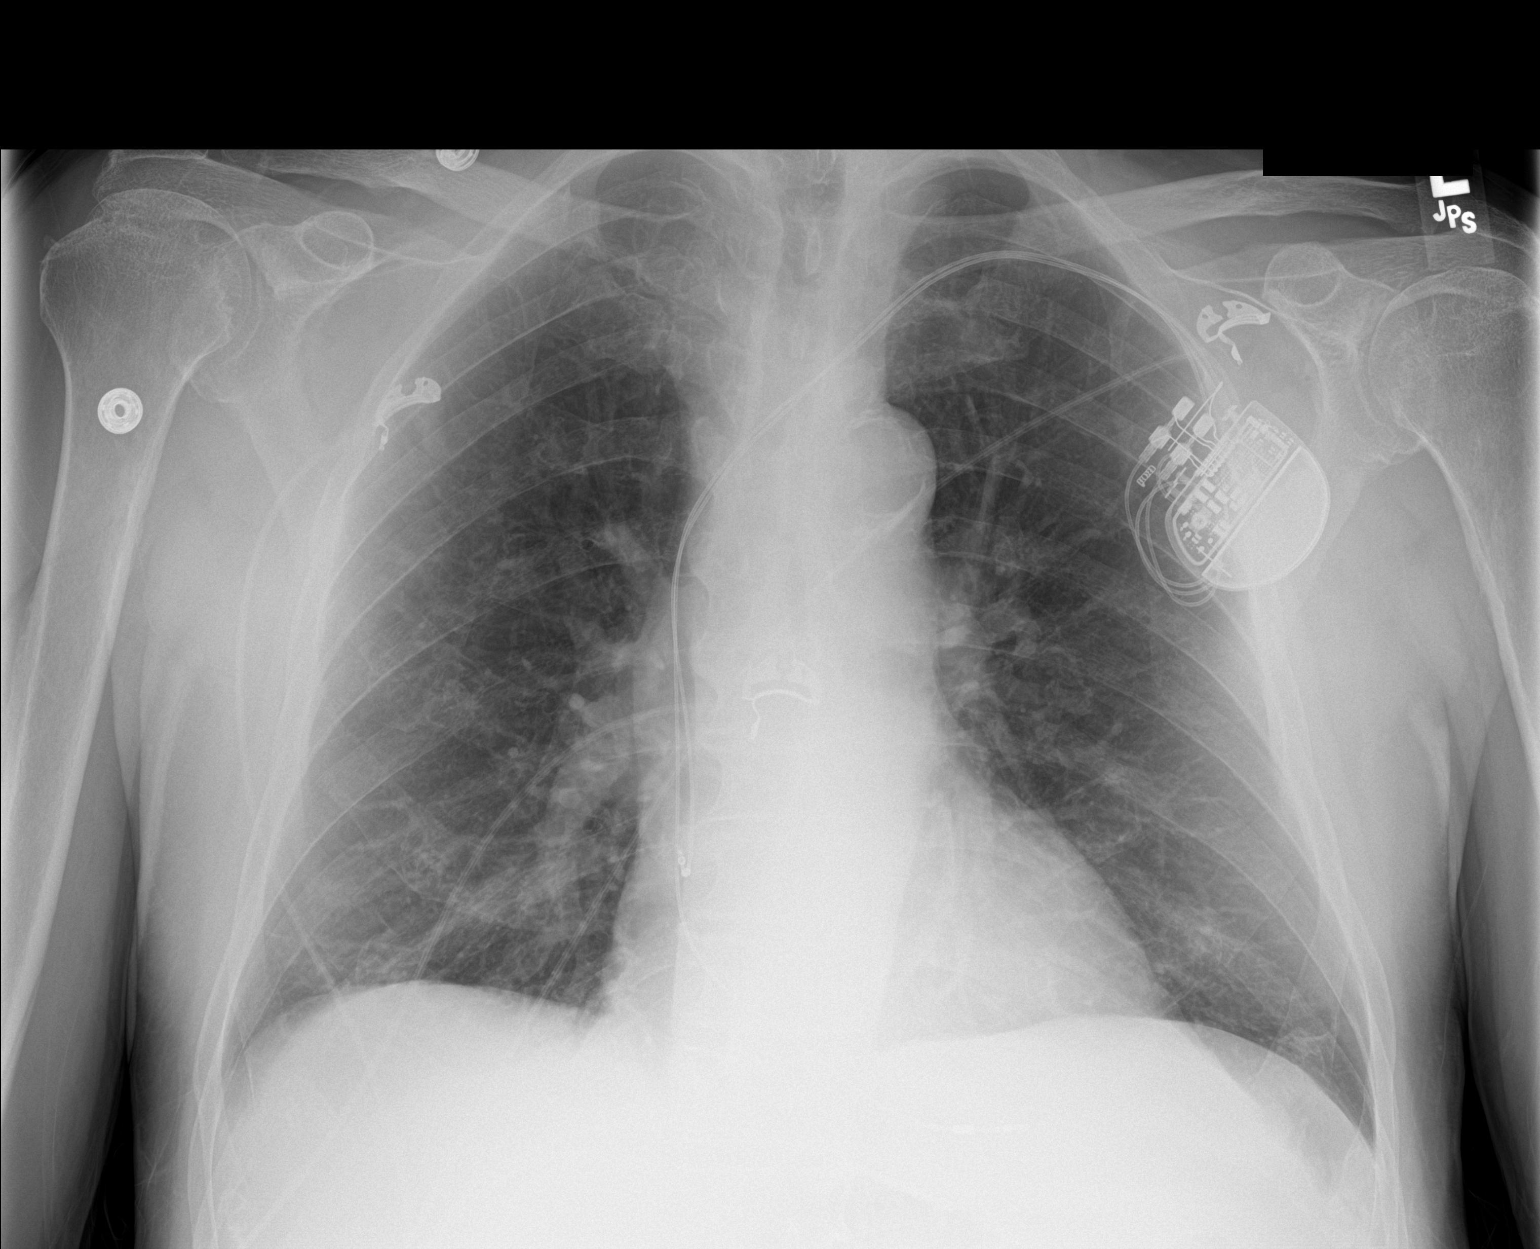

[1 of 1 positions shown; findings below may reference images not displayed]

FINDINGS: Normal heart size and mediastinal contours. Dual-chamber pacer leads
from the left in good position. No acute infiltrate or edema. No
effusion or pneumothorax. No acute osseous findings.
IMPRESSION: No active disease.

## 2021-12-11 IMAGING — CT CT HEAD W/O CM
4 series · 17 of 47 positions shown, 19 images · non-contrast
Comparison: Head CT and CT angiography 05/10/2019

CLINICAL DATA: Vertigo. Central. Unable to have MRI.

EXAM:
CT HEAD WITHOUT CONTRAST
TECHNIQUE: Contiguous axial images were obtained from the base of the skull
through the vertex without intravenous contrast.

[Series 3: head wo · axial · 0.46mm/px · z∈[-93,+37]mm · 7 of 36 slices shown, 9 images]
[im 5/36  brain]
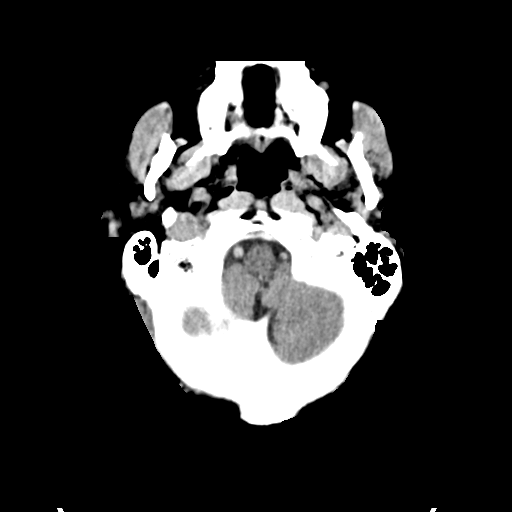
[im 5/36  bone]
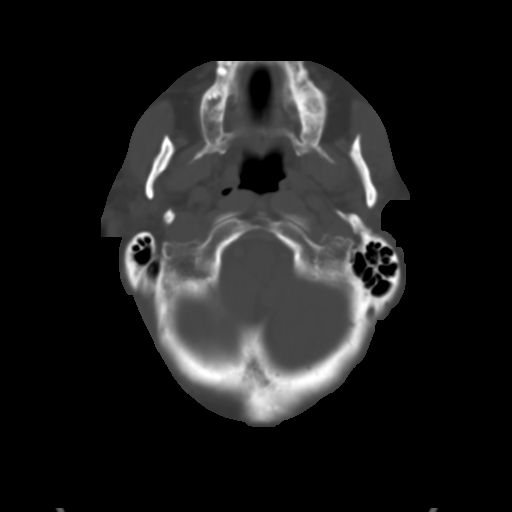
[im 9/36  brain]
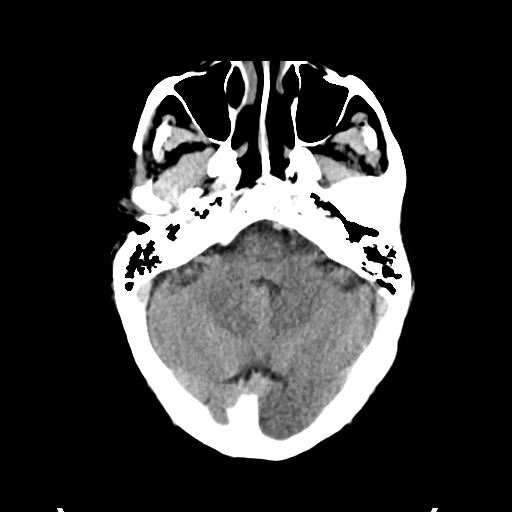
[im 14/36  brain]
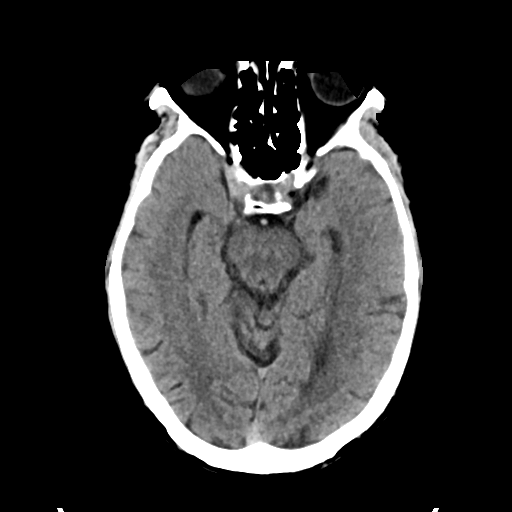
[im 18/36  brain]
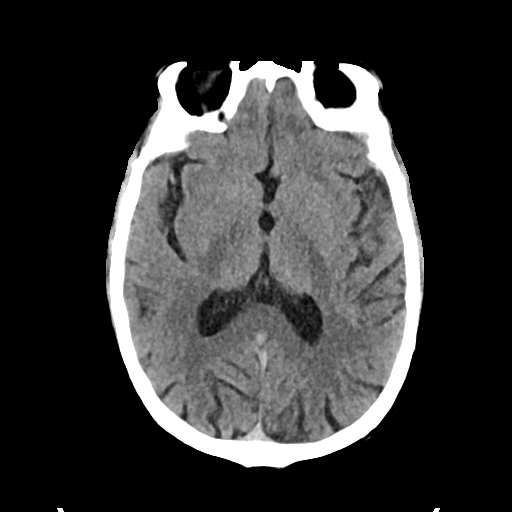
[im 22/36  brain]
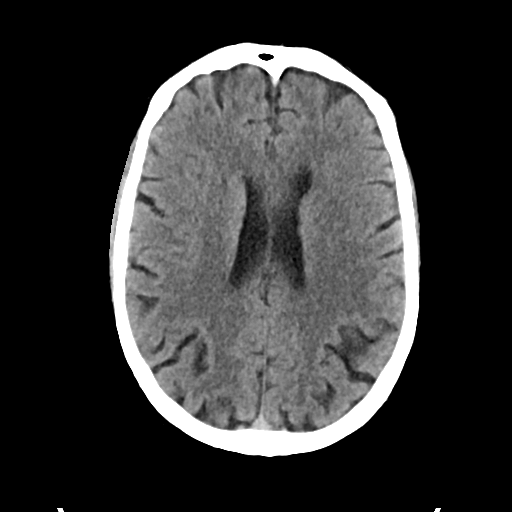
[im 22/36  bone]
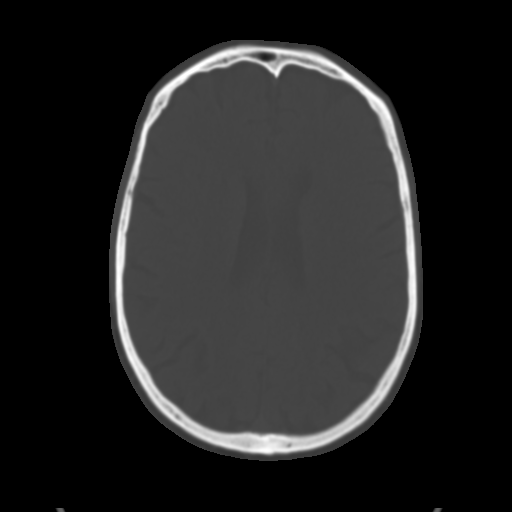
[im 27/36  brain]
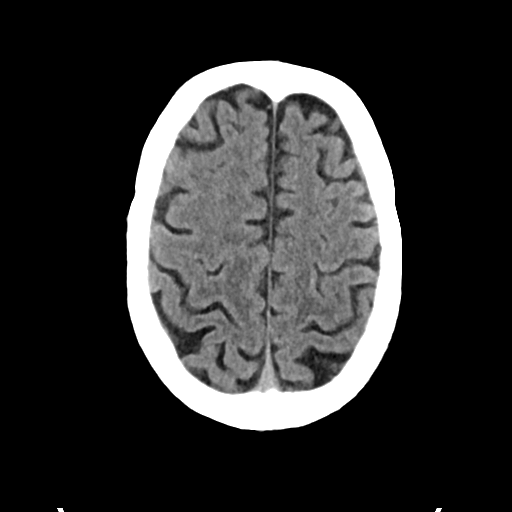
[im 31/36  brain]
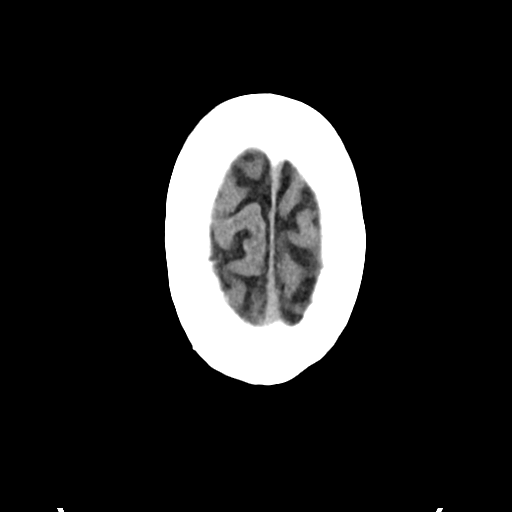

[Series 4: head bone · axial · 0.46mm/px · z∈[-97,-35]mm · 4 of 88 slices shown]
[im 9/88  bone]
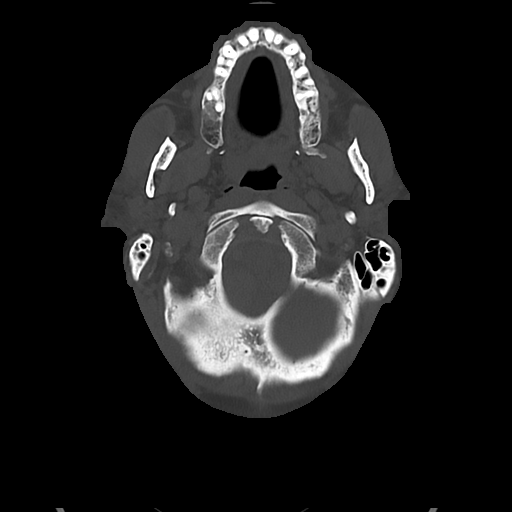
[im 18/88  bone]
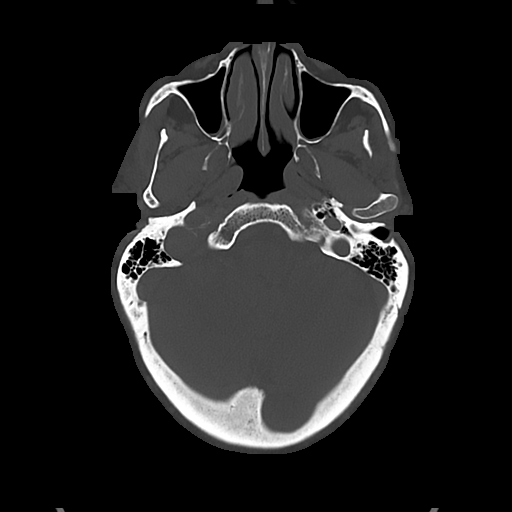
[im 27/88  bone]
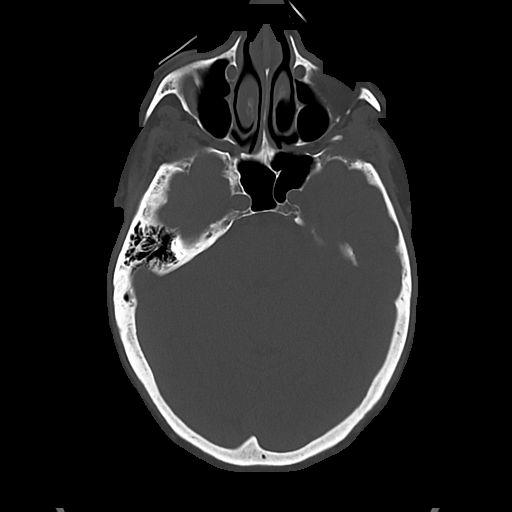
[im 40/88  bone]
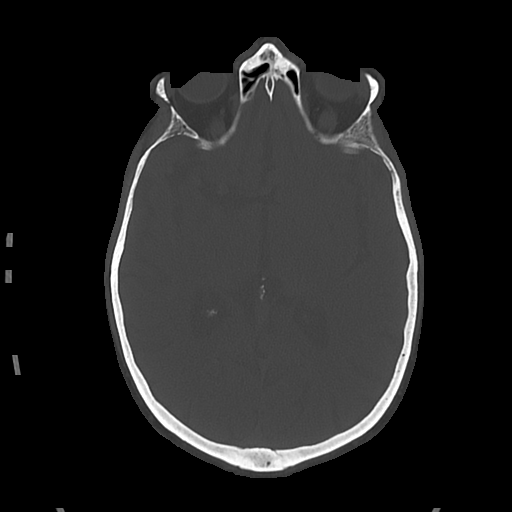

[Series 5: cor soft · coronal · 0.36mm/px · 3 of 72 slices shown]
[im 24/72  brain]
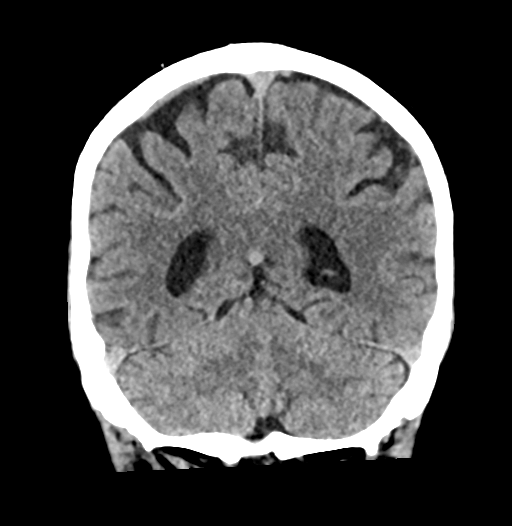
[im 32/72  brain]
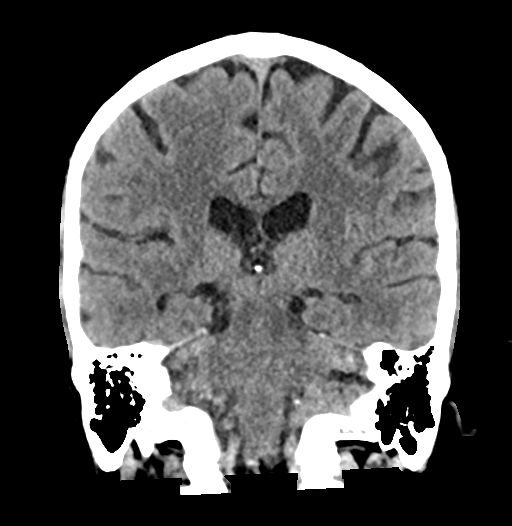
[im 40/72  brain]
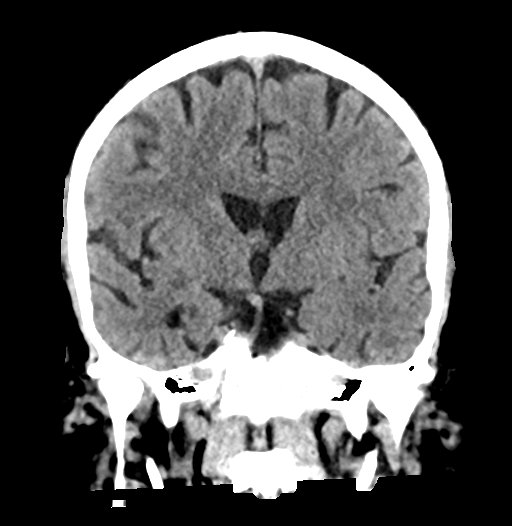

[Series 6: sag soft · sagittal · 0.37mm/px · 3 of 60 slices shown]
[im 20/60  brain]
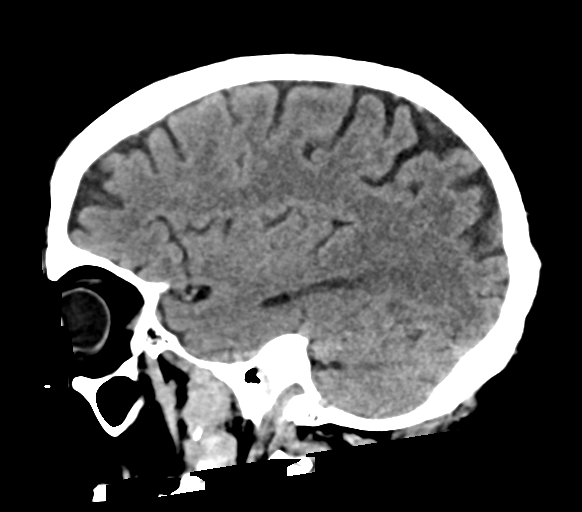
[im 30/60  brain]
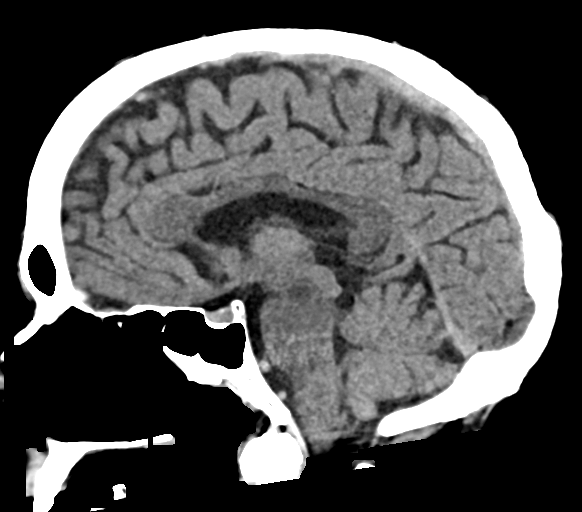
[im 40/60  brain]
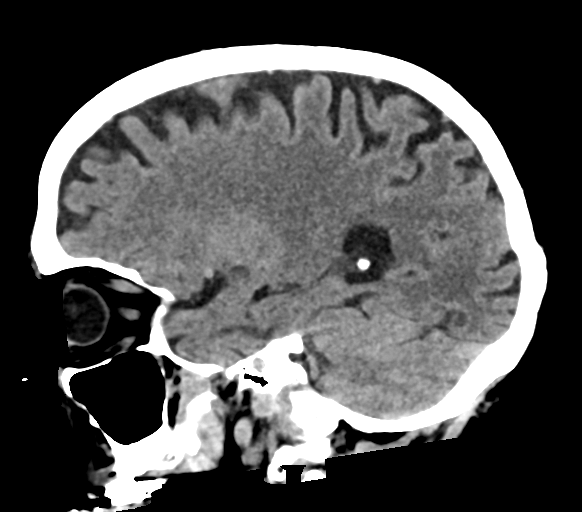

[17 of 47 positions shown; findings below may reference images not displayed]

FINDINGS: Brain: No intracranial hemorrhage, mass effect, or midline shift.
Stable brain volume. Stable chronic small vessel ischemia. No
hydrocephalus. The basilar cisterns are patent. No evidence of
territorial infarct or acute ischemia. No extra-axial or
intracranial fluid collection.

Vascular: No hyperdense vessel.

Skull: No fracture or focal lesion.

Sinuses/Orbits: Paranasal sinuses and mastoid air cells are clear.
The visualized orbits are unremarkable. Bilateral cataract
resection.

Other: None.
IMPRESSION: 1. No acute findings or change from prior exam.
2. Stable atrophy and chronic small vessel ischemia.

## 2021-12-19 ENCOUNTER — Ambulatory Visit (INDEPENDENT_AMBULATORY_CARE_PROVIDER_SITE_OTHER): Payer: Medicare HMO | Admitting: *Deleted

## 2021-12-19 VITALS — BP 196/81 | HR 68 | Ht 69.0 in | Wt 165.8 lb

## 2021-12-19 DIAGNOSIS — Z0000 Encounter for general adult medical examination without abnormal findings: Secondary | ICD-10-CM

## 2021-12-19 NOTE — Progress Notes (Signed)
Subjective:   Billy Burns is a 81 y.o. male who presents for Medicare Annual/Subsequent preventive examination.  Review of Systems    Defer to PCP Cardiac Risk Factors include: advanced age (>19mn, >>74women);hypertension;dyslipidemia     Objective:    Today's Vitals   12/19/21 0935  BP: (!) 183/82  Pulse: 68  Weight: 165 lb 12.8 oz (75.2 kg)  Height: '5\' 9"'$  (1.753 m)   Body mass index is 24.48 kg/m.     12/19/2021    9:42 AM 12/17/2020   10:07 AM 05/28/2019    3:11 PM 05/11/2019    3:42 AM 05/10/2019    9:09 AM 05/15/2017    6:35 PM 07/30/2016    9:11 AM  Advanced Directives  Does Patient Have a Medical Advance Directive? Yes No No  No No No  Type of Advance Directive Living will        Would patient like information on creating a medical advance directive?  No - Patient declined No - Patient declined No - Patient declined  No - Patient declined No - Patient declined    Current Medications (verified) Outpatient Encounter Medications as of 12/19/2021  Medication Sig   acetaminophen (TYLENOL) 325 MG tablet Take 325 mg by mouth every 6 (six) hours as needed for moderate pain.    Alfalfa 500 MG TABS Take 9-12 tablets by mouth daily. 9-12 TABS/DAY    b complex vitamins tablet Take 1 tablet by mouth daily.   carvedilol (COREG) 25 MG tablet TAKE 1 TABLET BY MOUTH TWICE DAILY WITH A MEAL   cloNIDine (CATAPRES) 0.1 MG tablet Take 1 tablet by mouth twice daily   dimenhyDRINATE (DRAMAMINE) 50 MG tablet Take 50 mg by mouth daily as needed.   Flaxseed, Linseed, (FLAX SEED OIL PO) Take 1,000 mg by mouth daily.    NON FORMULARY Take 1 tablet by mouth in the morning and at bedtime. Prostate Ess plus   OVER THE COUNTER MEDICATION Take 2 tablets by mouth every evening. HerbLax   Probiotic Product (PROBIOTIC DAILY PO) Take 1 capsule by mouth daily.   vitamin C (ASCORBIC ACID) 500 MG tablet Take 500 mg by mouth 2 (two) times daily.    vitamin E 180 MG (400 UNITS) capsule Take 400 Units by  mouth daily.   No facility-administered encounter medications on file as of 12/19/2021.    Allergies (verified) Ace inhibitors, Aspirin, Sulfa antibiotics, and Toprol xl [metoprolol succinate]   History: Past Medical History:  Diagnosis Date   Adenomatous colon polyp 06/05/11   Repeat 2018 per Dr. GCarlean Purl  Allergic angioedema    ACE inhibitor/beta blocker   Diverticulosis    Gastroenteritis 2012   Headache(784.0)    Hyperlipidemia    Hypertension    Influenza A 02/2011   Intermittent vertigo    Microhematuria 01/06/2012   Dr. POrland Mustard urologist in [place], did cysto and bladder bx 06/24/12 and it was unremarkable   Prostate [facility] ([facility] 2007   [facility]  Pt is s/p brachytherapy 2011.  Jan 2015 PSA 0.6 ng/ml.   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 KCaryl Comes   Syncope    ? Malignant vasovagal syncope-Pacer placed   Past Surgical History:  Procedure Laterality Date   CATARACT EXTRACTION  1984, 1994   (lens implants bilat)   COLONOSCOPY  06/05/11   636msigmoid polyp removed (+adenomatous), moderate diverticulosis, internal hemorrhoids.  Repeat 2018.   CYSTOSTOMY W/ BLADDER  BIOPSY  06/24/12   Benign   PACEMAKER INSERTION  02/2011   [employer]. X833 Dual chamber; place in [place], [place] when pt visiting there for holiday 02/2011.   RADIOACTIVE SEED IMPLANT     prostate 07/2009.  PSA dropped to below 2 after this (Dr. Harlow Asa at [facility])   Edgar ECHOCARDIOGRAM  02/2011   Normal   Family History  Problem Relation Age of Onset   Stroke Mother        during endarterectomy   Colon cancer Father    Diabetes Neg Hx    Social History   Socioeconomic History   Marital status: Married    Spouse name: [name]   Number of children: 2   Years of education: Not on file   Highest education level: Not on file  Occupational History   Occupation: Retired    Comment: Chartered loss adjuster for  [employer]  Tobacco Use   Smoking status: Former    Types: Cigarettes    Quit date: 03/26/1964    Years since quitting: 57.7   Smokeless tobacco: Never  Vaping Use   Vaping Use: Never used  Substance and Sexual Activity   Alcohol use: No   Drug use: No   Sexual activity: Not on file  Other Topics Concern   Not on file  Social History Narrative   Married, retired from [employer] 2004.No T/A/Ds.Walks daily, push mows his yard.   Lives with his daughter   Wife on Dialysis    Social Determinants of Health   Financial Resource Strain: Low Risk  (12/17/2020)   Overall Financial Resource Strain (CARDIA)    Difficulty of Paying Living Expenses: Not hard at all  Food Insecurity: No Food Insecurity (12/17/2020)   Hunger Vital Sign    Worried About Running Out of Food in the Last Year: Never true    Ran Out of Food in the Last Year: Never true  Transportation Needs: No Transportation Needs (12/17/2020)   PRAPARE - Hydrologist (Medical): No    Lack of Transportation (Non-Medical): No  Physical Activity: Insufficiently Active (12/17/2020)   Exercise Vital Sign    Days of Exercise per Week: 7 days    Minutes of Exercise per Session: 20 min  Stress: Stress Concern Present (12/17/2020)   [place]    Feeling of Stress : To some extent  Social Connections: Socially Integrated (12/17/2020)   Social Connection and Isolation Panel [NHANES]    Frequency of Communication with Friends and Family: More than three times a week    Frequency of Social Gatherings with Friends and Family: More than three times a week    Attends Religious Services: More than 4 times per year    Active Member of [employer] or Organizations: Yes    Attends Music therapist: More than 4 times per year    Marital Status: Married    Tobacco Counseling Counseling given: Not Answered   Clinical  Intake:  Pre-visit preparation completed: Yes  Pain : No/denies pain     Diabetes: No  How often do you need to have someone help you when you read instructions, pamphlets, or other written materials from your doctor or pharmacy?: 1 - Never  Diabetic? No  Activities of Daily Living    12/19/2021    9:44 AM  In your present state of health, do you have any  difficulty performing the following activities:  Hearing? 0  Vision? 1  Difficulty concentrating or making decisions? 1  Comment memory  Walking or climbing stairs? 0  Dressing or bathing? 0  Doing errands, shopping? 0  Preparing Food and eating ? N  Using the Toilet? N  In the past six months, have you accidently leaked urine? N  Do you have problems with loss of bowel control? N  Managing your Medications? N  Managing your Finances? N  Housekeeping or managing your Housekeeping? N    Patient Care Team: Shelda Pal, DO as PCP - General (Family Medicine) Deboraha Sprang, MD (Cardiology) Belva Bertin (Ophthalmology)  Indicate any recent Medical Services you may have received from other than [facility] providers in the past year (date may be approximate).     Assessment:   This is a routine wellness examination for Jabbar.  Hearing/Vision screen No results found.  Dietary issues and exercise activities discussed: Current Exercise Habits: Home exercise routine, Type of exercise: walking, Time (Minutes): 20, Frequency (Times/Week): 7, Weekly Exercise (Minutes/Week): 140, Intensity: Mild, Exercise limited by: None identified   Goals Addressed   None    Depression Screen    12/19/2021    9:43 AM 12/17/2020   10:05 AM 01/15/2020    9:07 AM 06/21/2016    8:49 AM 01/02/2016   11:01 AM 11/10/2014    8:06 AM  PHQ 2/9 Scores  PHQ - 2 Score 0 1 0 0 0 0    Fall Risk    12/19/2021    9:43 AM 12/17/2020   10:08 AM 06/21/2016    8:49 AM 01/02/2016   11:01 AM 11/10/2014    8:06 AM  Fall Risk   Falls in the  past year? 0 0 No No No  Number falls in past yr: 0 0     Injury with Fall? 0 0     Risk for fall due to : No Fall Risks History of fall(s);Impaired vision;Orthopedic patient     Follow up Falls evaluation completed Falls prevention discussed;Education provided       FALL RISK PREVENTION PERTAINING TO THE HOME:  Any stairs in or around the home? Yes  If so, are there any without handrails? No  Home free of loose throw rugs in walkways, pet beds, electrical cords, etc? Yes  Adequate lighting in your home to reduce risk of falls? Yes   ASSISTIVE DEVICES UTILIZED TO PREVENT FALLS:  Life alert? No  Use of a cane, walker or w/c? No  Grab bars in the bathroom? No  Shower chair or bench in shower? Yes  Elevated toilet seat or a handicapped toilet? No   TIMED UP AND GO:  Was the test performed? Yes .  Length of time to ambulate 10 feet: 8 sec.   Gait slow and steady without use of assistive device  Cognitive Function:        12/19/2021   10:02 AM 12/17/2020   10:12 AM  6CIT Screen  What Year? 0 points 0 points  What month? 3 points 0 points  What time? 0 points 0 points  Count back from 20 0 points 0 points  Months in reverse 4 points 0 points  Repeat phrase 6 points 4 points  Total Score 13 points 4 points    Immunizations Immunization History  Administered Date(s) Administered   Fluad Quad(high Dose 65+) 01/15/2020, 01/17/2021   Influenza Split 01/14/2012, 01/05/2019   Influenza, High Dose Seasonal PF 03/14/2015,  02/12/2018, 01/17/2021   Influenza,inj,Quad PF,6+ Mos 01/08/2013   Moderna Sars-Covid-2 Vaccination 06/03/2019, 07/01/2019, 11/14/2019   Pneumococcal Conjugate-13 05/15/2013   Pneumococcal Polysaccharide-23 11/14/2007   Td 10/02/2005   Tdap 09/06/2016   Zoster, Live 11/14/2010    TDAP status: Up to date  Flu Vaccine status: Declined, Education has been provided regarding the importance of this vaccine but patient still declined. Advised may receive this  vaccine at local pharmacy or Health Dept. Aware to provide a copy of the vaccination record if obtained from local pharmacy or Health Dept. Verbalized acceptance and understanding.  Pneumococcal vaccine status: Up to date  Covid-19 vaccine status: Information provided on how to obtain vaccines.   Qualifies for Shingles Vaccine? Yes   Zostavax completed Yes   Shingrix Completed?: No.    Education has been provided regarding the importance of this vaccine. Patient has been advised to call insurance company to determine out of pocket expense if they have not yet received this vaccine. Advised may also receive vaccine at local pharmacy or Health Dept. Verbalized acceptance and understanding.  Screening Tests Health Maintenance  Topic Date Due   COVID-19 Vaccine (4 - Moderna series) 01/17/2022 (Originally 01/09/2020)   INFLUENZA VACCINE  06/24/2022 (Originally 10/24/2021)   TETANUS/TDAP  09/07/2026   Pneumonia Vaccine 46+ Years old  Completed   HPV VACCINES  Aged Out   Zoster Vaccines- Shingrix  Discontinued    Health Maintenance  There are no preventive care reminders to display for this patient.   Colorectal cancer screening: No longer required.   Lung Cancer Screening: (Low Dose CT Chest recommended if Age 30-80 years, 30 pack-year currently smoking OR have quit w/in 15years.) does not qualify.   Lung Cancer Screening Referral: N/a  Additional Screening:  Hepatitis C Screening: does not qualify; Completed N/a  Vision Screening: Recommended annual ophthalmology exams for early detection of glaucoma and other disorders of the eye. Is the patient up to date with their annual eye exam?  Yes  Who is the provider or what is the name of the office in which the patient attends annual eye exams? Doesn't remember name  If pt is not established with a provider, would they like to be referred to a provider to establish care? No .   Dental Screening: Recommended annual dental exams for proper  oral hygiene  Community Resource Referral / Chronic Care Management: CRR required this visit?  No   CCM required this visit?  No      Plan:     I have personally reviewed and noted the following in the patient's chart:   Medical and social history Use of alcohol, tobacco or illicit drugs  Current medications and supplements including opioid prescriptions. Patient is not currently taking opioid prescriptions. Functional ability and status Nutritional status Physical activity Advanced directives List of other physicians Hospitalizations, surgeries, and ER visits in previous 12 months Vitals Screenings to include cognitive, depression, and falls Referrals and appointments  In addition, I have reviewed and discussed with patient certain preventive protocols, quality metrics, and best practice recommendations. A written personalized care plan for preventive services as well as general preventive health recommendations were provided to patient.     Beatris Ship, [place]   12/19/2021   Nurse Notes: None

## 2021-12-19 NOTE — Patient Instructions (Signed)
Mr. Billy Burns , Thank you for taking time to come for your Medicare Wellness Visit. I appreciate your ongoing commitment to your health goals. Please review the following plan we discussed and let me know if I can assist you in the future.   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       Patient Stated      Wants to try to make sure memory doesn't get worse        This is a list of the screening recommended for you and due dates:  Health Maintenance  Topic Date Due   COVID-19 Vaccine (4 - Moderna series) 01/17/2022*   Flu Shot  06/24/2022*   Tetanus Vaccine  09/07/2026   Pneumonia Vaccine  Completed   HPV Vaccine  Aged Out   Zoster (Shingles) Vaccine  Discontinued  *Topic was postponed. The date shown is not the original due date.      Next appointment: Follow up in one year for your annual wellness visit.   Preventive Care 72 Years and Older, Male Preventive care refers to lifestyle choices and visits with your health care provider that can promote health and wellness. What does preventive care include? A yearly physical exam. This is also called an annual well check. Dental exams once or twice a year. Routine eye exams. Ask your health care provider how often you should have your eyes checked. Personal lifestyle choices, including: Daily care of your teeth and gums. Regular physical activity. Eating a healthy diet. Avoiding tobacco and drug use. Limiting alcohol use. Practicing safe sex. Taking low doses of aspirin every day. Taking vitamin and mineral supplements as recommended by your health care provider. What happens during an annual well check? The services and screenings done by your health care provider during your annual well check will depend on your age, overall health, lifestyle risk factors, and family history of disease. Counseling  Your health care provider may ask you  questions about your: Alcohol use. Tobacco use. Drug use. Emotional well-being. Home and relationship well-being. Sexual activity. Eating habits. History of falls. Memory and ability to understand (cognition). Work and work Statistician. Screening  You may have the following tests or measurements: Height, weight, and BMI. Blood pressure. Lipid and cholesterol levels. These may be checked every 5 years, or more frequently if you are over 24 years old. Skin check. Lung cancer screening. You may have this screening every year starting at age 78 if you have a 30-pack-year history of smoking and currently smoke or have quit within the past 15 years. Fecal occult blood test (FOBT) of the stool. You may have this test every year starting at age 93. Flexible sigmoidoscopy or colonoscopy. You may have a sigmoidoscopy every 5 years or a colonoscopy every 10 years starting at age 65. Prostate cancer screening. Recommendations will vary depending on your family history and other risks. Hepatitis C blood test. Hepatitis B blood test. Sexually transmitted disease (STD) testing. Diabetes screening. This is done by checking your blood sugar (glucose) after you have not eaten for a while (fasting). You may have this done every 1-3 years. Abdominal aortic aneurysm (AAA) screening. You may need this if you are a current or former smoker. Osteoporosis. You may be screened starting at age 8 if you are at high risk. Talk with your health care provider about your test  results, treatment options, and if necessary, the need for more tests. Vaccines  Your health care provider may recommend certain vaccines, such as: Influenza vaccine. This is recommended every year. Tetanus, diphtheria, and acellular pertussis (Tdap, Td) vaccine. You may need a Td booster every 10 years. Zoster vaccine. You may need this after age 69. Pneumococcal 13-valent conjugate (PCV13) vaccine. One dose is recommended after age  17. Pneumococcal polysaccharide (PPSV23) vaccine. One dose is recommended after age 8. Talk to your health care provider about which screenings and vaccines you need and how often you need them. This information is not intended to replace advice given to you by your health care provider. Make sure you discuss any questions you have with your health care provider. Document Released: 04/08/2015 Document Revised: 11/30/2015 Document Reviewed: 01/11/2015 Elsevier Interactive Patient Education  2017 [place] Prevention in the Home Falls can cause injuries. They can happen to people of all ages. There are many things you can do to make your home safe and to help prevent falls. What can I do on the outside of my home? Regularly fix the edges of walkways and driveways and fix any cracks. Remove anything that might make you trip as you walk through a door, such as a raised step or threshold. Trim any bushes or trees on the path to your home. Use bright outdoor lighting. Clear any walking paths of anything that might make someone trip, such as rocks or tools. Regularly check to see if handrails are loose or broken. Make sure that both sides of any steps have handrails. Any raised decks and porches should have guardrails on the edges. Have any leaves, snow, or ice cleared regularly. Use sand or salt on walking paths during winter. Clean up any spills in your garage right away. This includes oil or grease spills. What can I do in the bathroom? Use night lights. Install grab bars by the toilet and in the tub and shower. Do not use towel bars as grab bars. Use non-skid mats or decals in the tub or shower. If you need to sit down in the shower, use a plastic, non-slip stool. Keep the floor dry. Clean up any water that spills on the floor as soon as it happens. Remove soap buildup in the tub or shower regularly. Attach bath mats securely with double-sided non-slip rug tape. Do not have throw  rugs and other things on the floor that can make you trip. What can I do in the bedroom? Use night lights. Make sure that you have a light by your bed that is easy to reach. Do not use any sheets or blankets that are too big for your bed. They should not hang down onto the floor. Have a firm chair that has side arms. You can use this for support while you get dressed. Do not have throw rugs and other things on the floor that can make you trip. What can I do in the kitchen? Clean up any spills right away. Avoid walking on wet floors. Keep items that you use a lot in easy-to-reach places. If you need to reach something above you, use a strong step stool that has a grab bar. Keep electrical cords out of the way. Do not use floor polish or wax that makes floors slippery. If you must use wax, use non-skid floor wax. Do not have throw rugs and other things on the floor that can make you trip. What can I do with my stairs?  Do not leave any items on the stairs. Make sure that there are handrails on both sides of the stairs and use them. Fix handrails that are broken or loose. Make sure that handrails are as long as the stairways. Check any carpeting to make sure that it is firmly attached to the stairs. Fix any carpet that is loose or worn. Avoid having throw rugs at the top or bottom of the stairs. If you do have throw rugs, attach them to the floor with carpet tape. Make sure that you have a light switch at the top of the stairs and the bottom of the stairs. If you do not have them, ask someone to add them for you. What else can I do to help prevent falls? Wear shoes that: Do not have high heels. Have rubber bottoms. Are comfortable and fit you well. Are closed at the toe. Do not wear sandals. If you use a stepladder: Make sure that it is fully opened. Do not climb a closed stepladder. Make sure that both sides of the stepladder are locked into place. Ask someone to hold it for you, if  possible. Clearly mark and make sure that you can see: Any grab bars or handrails. First and last steps. Where the edge of each step is. Use tools that help you move around (mobility aids) if they are needed. These include: Canes. Walkers. Scooters. Crutches. Turn on the lights when you go into a dark area. Replace any light bulbs as soon as they burn out. Set up your furniture so you have a clear path. Avoid moving your furniture around. If any of your floors are uneven, fix them. If there are any pets around you, be aware of where they are. Review your medicines with your doctor. Some medicines can make you feel dizzy. This can increase your chance of falling. Ask your doctor what other things that you can do to help prevent falls. This information is not intended to replace advice given to you by your health care provider. Make sure you discuss any questions you have with your health care provider. Document Released: 01/06/2009 Document Revised: 08/18/2015 Document Reviewed: 04/16/2014 Elsevier Interactive Patient Education  2017 [employer].

## 2021-12-21 ENCOUNTER — Ambulatory Visit (INDEPENDENT_AMBULATORY_CARE_PROVIDER_SITE_OTHER): Payer: Medicare HMO | Admitting: Family Medicine

## 2021-12-21 ENCOUNTER — Encounter: Payer: Self-pay | Admitting: Family Medicine

## 2021-12-21 VITALS — BP 150/86 | HR 66 | Temp 97.6°F | Resp 16 | Wt 163.0 lb

## 2021-12-21 DIAGNOSIS — I10 Essential (primary) hypertension: Secondary | ICD-10-CM

## 2021-12-21 DIAGNOSIS — D692 Other nonthrombocytopenic purpura: Secondary | ICD-10-CM | POA: Insufficient documentation

## 2021-12-21 MED ORDER — AMLODIPINE BESYLATE 5 MG PO TABS: 5.0000 mg | ORAL_TABLET | Freq: Every day | ORAL | 3 refills | Status: DC

## 2021-12-21 NOTE — Patient Instructions (Signed)
Keep checking blood pressure at home.   Keep the diet clean and stay active.  Let us know if you need anything.

## 2021-12-21 NOTE — Progress Notes (Signed)
Chief Complaint  Patient presents with   Hypertension    Patient reports having elevated bp readings at home 153/73 this morning at 8:15 am    Subjective Billy Burns is a 81 y.o. male who presents for hypertension follow up. He does monitor home blood pressures. Blood pressures ranging from 140-150's/70's on average. He is compliant with medications- Coreg 25 mg bid, clonidine 0.1 mg bid. Patient has these side effects of medication: none He is adhering to a healthy diet overall. Current exercise: walking No Cp or SOB.    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 Carlean Purl   Allergic angioedema    ACE inhibitor/beta blocker   Diverticulosis    Gastroenteritis 2012   Headache(784.0)    Hyperlipidemia    Hypertension    Influenza A 02/2011   Intermittent vertigo    Microhematuria 01/06/2012   Dr. Orland Mustard, urologist in [place], did cysto and bladder bx 06/24/12 and it was unremarkable   Prostate cancer [facility]) 2007   Dr. Harlow Asa ([facility])  Pt is s/p brachytherapy 2011.  Jan 2015 PSA 0.6 ng/ml.   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 Caryl Comes)   Syncope    ? Malignant vasovagal syncope-Pacer placed    Exam BP (!) 150/86 (BP Location: Left Arm, Cuff Size: Normal)   Pulse 66   Temp 97.6 F (36.4 C) (Oral)   Resp 16   Wt 163 lb (73.9 kg)   SpO2 97%   BMI 24.07 kg/m  General:  well developed, well nourished, in no apparent distress Heart: RRR, no bruits, no LE edema Lungs: clear to auscultation, no accessory muscle use Psych: well oriented with normal range of affect and appropriate judgment/insight  Essential hypertension - Plan: amLODipine (NORVASC) 5 MG tablet  Senile purpura (HCC)  Chronic, unstable. Cont Coreg 25 mg bid, clonidine 0.1 mg bid. Add Norvasc 5 mg/d. Angioedema w ACEi's, will avoid those and ARBs given this. Monitor BP at home. Counseled on diet and exercise. F/u in 1  mo. The patient voiced understanding and agreement to the plan.  [place], DO 12/21/21  9:12 AM

## 2022-01-15 ENCOUNTER — Other Ambulatory Visit: Payer: Self-pay | Admitting: Family Medicine

## 2022-01-16 ENCOUNTER — Encounter: Payer: Self-pay | Admitting: Family Medicine

## 2022-01-16 ENCOUNTER — Ambulatory Visit (INDEPENDENT_AMBULATORY_CARE_PROVIDER_SITE_OTHER): Payer: Medicare HMO | Admitting: Family Medicine

## 2022-01-16 VITALS — BP 120/68 | HR 66 | Temp 97.7°F | Ht 69.0 in | Wt 166.2 lb

## 2022-01-16 DIAGNOSIS — I10 Essential (primary) hypertension: Secondary | ICD-10-CM | POA: Diagnosis not present

## 2022-01-16 NOTE — Patient Instructions (Signed)
Keep the diet clean and stay active.  Artificial tears like Refresh and Systane may be used for comfort. OK to get generic version. Generally people use them every 2-4 hours, but you can use them as much as you want because there is no medication in it.  Flonase (fluticasone); nasal spray that is over the counter. 2 sprays each nostril, once daily. Aim towards the same side eye when you spray. This is available over the counter.  Let us know if you need anything.

## 2022-01-16 NOTE — Progress Notes (Signed)
Chief Complaint  Patient presents with   Follow-up    Blood pressure     Subjective Billy Burns is a 81 y.o. male who presents for hypertension follow up. He does monitor home blood pressures. Blood pressures ranging from 110's-130's/60-70's on average. He is compliant with medications- Norvasc 5 mg/d, Catapres 0.1 mg bid, Coreg 25 mg bid. Patient has these side effects of medication: none He is adhering to a healthy diet overall. Current exercise: walking No CP or SOB.    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 Carlean Purl   Allergic angioedema    ACE inhibitor/beta blocker   Diverticulosis    Gastroenteritis 2012   Headache(784.0)    Hyperlipidemia    Hypertension    Influenza A 02/2011   Intermittent vertigo    Microhematuria 01/06/2012   Dr. Orland Mustard, urologist in [place], did cysto and bladder bx 06/24/12 and it was unremarkable   Prostate cancer [facility]) 2007   Dr. Harlow Asa ([facility])  Pt is s/p brachytherapy 2011.  Jan 2015 PSA 0.6 ng/ml.   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 Caryl Comes)   Syncope    ? Malignant vasovagal syncope-Pacer placed    Exam BP 120/68 (BP Location: Left Arm, Patient Position: Sitting, Cuff Size: Normal)   Pulse 66   Temp 97.7 F (36.5 C) (Oral)   Ht '5\' 9"'$  (1.753 m)   Wt 166 lb 4 oz (75.4 kg)   SpO2 97%   BMI 24.55 kg/m  General:  well developed, well nourished, in no apparent distress Heart: RRR, no bruits, no LE edema Lungs: clear to auscultation, no accessory muscle use Psych: well oriented with normal range of affect and appropriate judgment/insight  Essential hypertension  Chronic, stable. Cont Coreg 25 mg bid, clonidine 0.1 mg bid, Norvasc 5 mg/d. Counseled on diet and exercise. F/u in 6 mo. The patient voiced understanding and agreement to the plan.  [place], DO 01/16/22  8:51 AM

## 2022-04-09 ENCOUNTER — Other Ambulatory Visit: Payer: Self-pay | Admitting: Family Medicine

## 2022-04-09 DIAGNOSIS — I10 Essential (primary) hypertension: Secondary | ICD-10-CM

## 2022-04-15 ENCOUNTER — Other Ambulatory Visit: Payer: Self-pay | Admitting: Family Medicine

## 2022-05-07 ENCOUNTER — Other Ambulatory Visit: Payer: Self-pay | Admitting: Family Medicine

## 2022-05-07 DIAGNOSIS — I10 Essential (primary) hypertension: Secondary | ICD-10-CM

## 2022-05-24 ENCOUNTER — Encounter: Payer: Self-pay | Admitting: Family Medicine

## 2022-05-24 ENCOUNTER — Ambulatory Visit (INDEPENDENT_AMBULATORY_CARE_PROVIDER_SITE_OTHER): Payer: Medicare HMO | Admitting: Family Medicine

## 2022-05-24 VITALS — BP 149/69 | HR 71 | Temp 98.2°F | Resp 18 | Ht 69.0 in | Wt 164.8 lb

## 2022-05-24 DIAGNOSIS — L308 Other specified dermatitis: Secondary | ICD-10-CM

## 2022-05-24 MED ORDER — HYDROCORTISONE 2.5 % EX CREA: TOPICAL_CREAM | Freq: Two times a day (BID) | CUTANEOUS | 0 refills | Status: DC

## 2022-05-24 NOTE — Progress Notes (Signed)
   Acute Office Visit  Subjective:     Patient ID: Billy Burns, male    DOB: 1940/04/09, 82 y.o.   MRN: [id number]  Chief Complaint  Patient presents with   Rash    Pt says the rash started 2 weeks ago on the L leg. He started using anti itch cream and now he has the rash on the R leg. The Cream doesn't seem to be working.      Patient is in today for rash.   Patient reports he has had a mild rash to his left lower leg for over 2 weeks now. States he first noticed it getting out of the shower one day. It has been very itching. He recently noticed a similar area to right upper leg, above knee, but much smaller area. He has tried OTC anti-itch cream (diphenhydramine/zinc) with little benefit. No new soaps, detergents, foods, skin care, environmental exposures. No systemic symptoms.     All review of systems negative except what is listed in the HPI      Objective:    BP (!) 149/69 (BP Location: Left Arm, Patient Position: Sitting, Cuff Size: Normal)   Pulse 71   Temp 98.2 F (36.8 C) (Oral)   Resp 18   Ht 5' 9"$  (1.753 m)   Wt 164 lb 12.8 oz (74.8 kg)   SpO2 98%   BMI 24.34 kg/m    Physical Exam Vitals reviewed.  Constitutional:      General: He is not in acute distress.    Appearance: Normal appearance. He is not ill-appearing.  HENT:     Head: Normocephalic and atraumatic.  Musculoskeletal:        General: No swelling or tenderness. Normal range of motion.  Skin:    General: Skin is warm and dry.     Findings: Rash present.     Comments: Left lower leg with mild erythematous, maculopapular rash, small area to right upper leg above knee as well; no edema, warmth, open lesions, drainage, streaking   Neurological:     General: No focal deficit present.     Mental Status: He is alert and oriented to person, place, and time. Mental status is at baseline.  Psychiatric:        Mood and Affect: Mood normal.        Behavior: Behavior normal.        Thought Content:  Thought content normal.        Judgment: Judgment normal.     No results found for any visits on 05/24/22.      Assessment & Plan:   Problem List Items Addressed This Visit   None Visit Diagnoses     Pruritic dermatitis    -  Primary Trial of hydrocortisone cream to involved area.  Patient aware of signs/symptoms requiring further/urgent evaluation.    Relevant Medications   hydrocortisone 2.5 % cream       Meds ordered this encounter  Medications   hydrocortisone 2.5 % cream    Sig: Apply topically 2 (two) times daily.    Dispense:  30 g    Refill:  0    Order Specific Question:   Supervising Provider    Answer:   Penni Homans A [[id number]]    Return if symptoms worsen or fail to improve.  Terrilyn Saver, NP

## 2022-06-03 ENCOUNTER — Other Ambulatory Visit: Payer: Self-pay | Admitting: Family Medicine

## 2022-06-03 DIAGNOSIS — I10 Essential (primary) hypertension: Secondary | ICD-10-CM

## 2022-06-06 ENCOUNTER — Other Ambulatory Visit: Payer: Self-pay | Admitting: Family Medicine

## 2022-06-06 ENCOUNTER — Telehealth: Payer: Self-pay | Admitting: Family Medicine

## 2022-06-06 DIAGNOSIS — I10 Essential (primary) hypertension: Secondary | ICD-10-CM

## 2022-06-06 NOTE — Telephone Encounter (Signed)
Pt's wife stated he finished the hydrocortisone 2.5 % cream but that rash has not gone away. She would l;ike to know what else can be done.

## 2022-06-07 DIAGNOSIS — H524 Presbyopia: Secondary | ICD-10-CM | POA: Diagnosis not present

## 2022-06-07 NOTE — Telephone Encounter (Signed)
Pt's wife notified of provider recommendations.

## 2022-06-11 ENCOUNTER — Ambulatory Visit (INDEPENDENT_AMBULATORY_CARE_PROVIDER_SITE_OTHER): Payer: Medicare HMO | Admitting: Family Medicine

## 2022-06-11 ENCOUNTER — Encounter: Payer: Self-pay | Admitting: Family Medicine

## 2022-06-11 VITALS — BP 132/72 | HR 65 | Temp 97.4°F | Ht 69.0 in | Wt 164.4 lb

## 2022-06-11 DIAGNOSIS — L309 Dermatitis, unspecified: Secondary | ICD-10-CM

## 2022-06-11 MED ORDER — LEVOCETIRIZINE DIHYDROCHLORIDE 5 MG PO TABS: 5.0000 mg | ORAL_TABLET | Freq: Every evening | ORAL | 2 refills | Status: DC

## 2022-06-11 MED ORDER — TRIAMCINOLONE ACETONIDE 0.1 % EX CREA: 1.0000 | TOPICAL_CREAM | Freq: Two times a day (BID) | CUTANEOUS | 0 refills | Status: DC

## 2022-06-11 NOTE — Progress Notes (Signed)
Chief Complaint  Patient presents with   Rash    Billy Burns is a 82 y.o. male here for a skin complaint.  Duration: a few weeks Location: inside of knees, R forearm, abd Pruritic? Yes Painful? No Drainage? No New soaps/lotions/topicals/detergents? No Sick contacts? 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 Carlean Purl   Allergic angioedema    ACE inhibitor/beta blocker   Diverticulosis    Gastroenteritis 2012   Headache(784.0)    Hyperlipidemia    Hypertension    Influenza A 02/2011   Intermittent vertigo    Microhematuria 01/06/2012   Dr. Orland Mustard, urologist in [place], did cysto and bladder bx 06/24/12 and it was unremarkable   Prostate cancer [facility]) 2007   Dr. Harlow Asa ([facility])  Pt is s/p brachytherapy 2011.  Jan 2015 PSA 0.6 ng/ml.   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 Caryl Comes)   Syncope    ? Malignant vasovagal syncope-Pacer placed    BP 132/72 (BP Location: Left Arm, Patient Position: Sitting, Cuff Size: Normal)   Pulse 65   Temp (!) 97.4 F (36.3 C) (Oral)   Ht 5\' 9"  (1.753 m)   Wt 164 lb 6 oz (74.6 kg)   SpO2 99%   BMI 24.27 kg/m  Gen: awake, alert, appearing stated age Lungs: No accessory muscle use Skin: pink/red patch over abd and mild hyperpigmented tissue over medial knees. No drainage, TTP, fluctuance, excoriation Psych: Age appropriate judgment and insight  Eczema, unspecified type - Plan: triamcinolone cream (KENALOG) 0.1 %, levocetirizine (XYZAL) 5 MG tablet  Try not to scratch as this can make things worse. Avoid scented products while dealing with this. You may resume when the itchiness resolves. Cold/cool compresses can help. Kenalog cream bid for 7 d. Xyzal qd prn.  F/u prn. The patient voiced understanding and agreement to the  plan.  [place], DO 06/11/22 9:22 AM

## 2022-06-11 NOTE — Patient Instructions (Addendum)
Try not to scratch as this can make things worse. Avoid scented products while dealing with this. You may resume when the itchiness resolves. Cold/cool compresses can help.   Claritin (loratadine), Allegra (fexofenadine), Zyrtec (cetirizine) which is also equivalent to Xyzal (levocetirizine); these are listed in order from weakest to strongest. Generic, and therefore cheaper, options are in the parentheses.   There are available OTC, and the generic versions, which may be cheaper, are in parentheses. Show this to a pharmacist if you have trouble finding any of these items.  Let us know if you are still having issues in the next few weeks.   Let us know if you need anything.

## 2022-07-17 ENCOUNTER — Encounter: Payer: Self-pay | Admitting: Family Medicine

## 2022-07-17 ENCOUNTER — Ambulatory Visit (INDEPENDENT_AMBULATORY_CARE_PROVIDER_SITE_OTHER): Payer: Medicare HMO | Admitting: Family Medicine

## 2022-07-17 VITALS — BP 108/64 | HR 66 | Temp 97.7°F | Ht 69.0 in | Wt 164.2 lb

## 2022-07-17 DIAGNOSIS — Z0000 Encounter for general adult medical examination without abnormal findings: Secondary | ICD-10-CM

## 2022-07-17 DIAGNOSIS — I10 Essential (primary) hypertension: Secondary | ICD-10-CM | POA: Diagnosis not present

## 2022-07-17 DIAGNOSIS — D692 Other nonthrombocytopenic purpura: Secondary | ICD-10-CM

## 2022-07-17 LAB — COMPREHENSIVE METABOLIC PANEL
ALT: 16 U/L (ref 0–53)
AST: 20 U/L (ref 0–37)
Albumin: 4.3 g/dL (ref 3.5–5.2)
Alkaline Phosphatase: 88 U/L (ref 39–117)
BUN: 18 mg/dL (ref 6–23)
CO2: 28 mEq/L (ref 19–32)
Calcium: 9.2 mg/dL (ref 8.4–10.5)
Chloride: 104 mEq/L (ref 96–112)
Creatinine, Ser: 1.07 mg/dL (ref 0.40–1.50)
GFR: 64.27 mL/min (ref >60.00)
Glucose, Bld: 101 mg/dL — ABNORMAL HIGH (ref 70–99)
Potassium: 4.1 mEq/L (ref 3.5–5.1)
Sodium: 138 mEq/L (ref 135–145)
Total Bilirubin: 0.9 mg/dL (ref 0.2–1.2)
Total Protein: 7 g/dL (ref 6.0–8.3)

## 2022-07-17 LAB — LIPID PANEL
Cholesterol: 167 mg/dL (ref 0–200)
HDL: 43.6 mg/dL (ref >39.00)
LDL Cholesterol: 109 mg/dL — ABNORMAL HIGH (ref 0–99)
NonHDL: 123.81
Total CHOL/HDL Ratio: 4
Triglycerides: 72 mg/dL (ref 0.0–149.0)
VLDL: 14.4 mg/dL (ref 0.0–40.0)

## 2022-07-17 LAB — CBC
HCT: 42.4 % (ref 39.0–52.0)
Hemoglobin: 14 g/dL (ref 13.0–17.0)
MCHC: 33.1 g/dL (ref 30.0–36.0)
MCV: 90 fl (ref 78.0–100.0)
Platelets: 266 10*3/uL (ref 150.0–400.0)
RBC: 4.72 Mil/uL (ref 4.22–5.81)
RDW: 14.1 % (ref 11.5–15.5)
WBC: 7.9 10*3/uL (ref 4.0–10.5)

## 2022-07-17 NOTE — Progress Notes (Signed)
Chief Complaint  Patient presents with   Annual Exam    Well Male Billy Burns is here for a complete physical.   His last physical was a year ago.  Current diet: in general, a "healthy" diet.   Current exercise: walking Weight trend: stable Fatigue out of ordinary? No. Seat belt? Yes.   Advanced directive? Yes  Health maintenance Shingrix- Declines Tetanus- Yes Pneumonia vaccine- Yes  Past Medical History:  Diagnosis Date   Adenomatous colon polyp 06/05/11   Repeat 2018 per Dr. Leone Payor   Allergic angioedema    ACE inhibitor/beta blocker   Diverticulosis    Gastroenteritis 2012   Headache(784.0)    Hyperlipidemia    Hypertension    Influenza A 02/2011   Intermittent vertigo    Microhematuria 01/06/2012   Dr. Ernestine Conrad, urologist in [place], did cysto and bladder bx 06/24/12 and it was unremarkable   Prostate cancer 2007   Dr. Gerome Sam ([facility])  Pt is s/p brachytherapy 2011.  Jan 2015 PSA 0.6 ng/ml.   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 Graciela Husbands)   Syncope    ? Malignant vasovagal syncope-Pacer placed     Past Surgical History:  Procedure Laterality Date   CATARACT EXTRACTION  1984, 1994   (lens implants bilat)   COLONOSCOPY  06/05/11   6mm sigmoid polyp removed (+adenomatous), moderate diverticulosis, internal hemorrhoids.  Repeat 2018.   CYSTOSTOMY W/ BLADDER BIOPSY  06/24/12   Benign   PACEMAKER INSERTION  02/2011   [employer]. Z610 Dual chamber; place in [place], [place] when pt visiting there for holiday 02/2011.   RADIOACTIVE SEED IMPLANT     prostate 07/2009.  PSA dropped to below 2 after this (Dr. Gerome Sam at [facility] in [facility])   TONSILLECTOMY  1950   TRANSTHORACIC ECHOCARDIOGRAM  02/2011   Normal    Medications  Current Outpatient Medications on File Prior to Visit  Medication Sig Dispense Refill   acetaminophen (TYLENOL) 325 MG tablet Take 325 mg by mouth every 6 (six) hours as needed for moderate pain.       Alfalfa 500 MG TABS Take 9-12 tablets by mouth daily. 9-12 TABS/DAY      amLODipine (NORVASC) 5 MG tablet Take 1 tablet by mouth once daily 30 tablet 0   b complex vitamins tablet Take 1 tablet by mouth daily.     carvedilol (COREG) 25 MG tablet TAKE 1 TABLET BY MOUTH TWICE DAILY WITH A MEAL 180 tablet 0   cloNIDine (CATAPRES) 0.1 MG tablet Take 1 tablet by mouth twice daily 180 tablet 0   Flaxseed, Linseed, (FLAX SEED OIL PO) Take 1,000 mg by mouth daily.      levocetirizine (XYZAL) 5 MG tablet Take 1 tablet (5 mg total) by mouth every evening. 30 tablet 2   NON FORMULARY Take 1 tablet by mouth in the morning and at bedtime. Prostate Ess plus     OVER THE COUNTER MEDICATION Take 2 tablets by mouth every evening. HerbLax     Probiotic Product (PROBIOTIC DAILY PO) Take 1 capsule by mouth daily.     triamcinolone cream (KENALOG) 0.1 % Apply 1 Application topically 2 (two) times daily. 30 g 0   vitamin C (ASCORBIC ACID) 500 MG tablet Take 500 mg by mouth 2 (two) times daily.      vitamin E 180 MG (400 UNITS) capsule Take 400 Units by mouth daily.     Allergies Allergies  Allergen Reactions  Ace Inhibitors Other (See Comments)    REACTION: Angioedema   Aspirin Other (See Comments)    REACTION: Stomach Discomfort/TAKES 81 MG BUT CANT TAKE 325 MG ASA PER PT.   Sulfa Antibiotics     Pt does not remember - happened in high school   Toprol Xl [Metoprolol Succinate] Other (See Comments)    Angioedema    Family History Family History  Problem Relation Age of Onset   Stroke Mother        during endarterectomy   Colon cancer Father    Diabetes Neg Hx     Review of Systems: Constitutional:  no fevers Eye:  no recent significant change in vision Ears:  No changes in hearing Nose/Mouth/Throat:  no complaints of nasal congestion, no sore throat Cardiovascular: no chest pain Respiratory:  No shortness of breath Gastrointestinal:  No change in bowel habits GU:  No  frequency Integumentary:  no abnormal skin lesions reported Neurologic:  memory slightly worse from last year Endocrine:  denies unexplained weight changes  Exam BP 108/64 (BP Location: Left Arm, Patient Position: Sitting, Cuff Size: Normal)   Pulse 66   Temp 97.7 F (36.5 C) (Oral)   Ht  (1.753 m)   Wt 164 lb 4 oz (74.5 kg)   SpO2 98%   BMI 24.26 kg/m  General:  well developed, well nourished, in no apparent distress Skin: purpura on forearms b/l; no significant moles, warts, or growths otherwise Head:  no masses, lesions, or tenderness Eyes:  pupils equal and round, sclera anicteric without injection Ears:  canals without lesions, TMs shiny without retraction, no obvious effusion, no erythema Nose:  nares patent, mucosa normal Throat/Pharynx:  lips and gingiva without lesion; tongue and uvula midline; non-inflamed pharynx; no exudates or postnasal drainage Lungs:  clear to auscultation, breath sounds equal bilaterally, no respiratory distress Cardio:  regular rate and rhythm, no LE edema or bruits Rectal: Deferred GI: BS+, S, NT, ND, no masses or organomegaly Musculoskeletal:  symmetrical muscle groups noted without atrophy or deformity Neuro:  gait normal; deep tendon reflexes normal and symmetric Psych: well oriented with normal range of affect and appropriate judgment/insight  Assessment and Plan  Well adult exam  Senile purpura  Essential hypertension - Plan: CBC, Comprehensive metabolic panel, Lipid panel   Well 82 y.o. male. Counseled on diet and exercise. Advanced directive form requested today.  Other orders as above. Follow up in 6 mo.  The patient voiced understanding and agreement to the plan.  Jilda Roche [place], DO 07/17/22 8:38 AM

## 2022-07-17 NOTE — Patient Instructions (Signed)
Give us 2-3 business days to get the results of your labs back.   Keep the diet clean and stay active.  Please get me a copy of your advanced directive form at your convenience.   Let us know if you need anything.  

## 2022-07-22 ENCOUNTER — Other Ambulatory Visit: Payer: Self-pay | Admitting: Family Medicine

## 2022-07-26 ENCOUNTER — Other Ambulatory Visit: Payer: Self-pay | Admitting: Family Medicine

## 2022-07-26 DIAGNOSIS — I10 Essential (primary) hypertension: Secondary | ICD-10-CM

## 2022-08-24 ENCOUNTER — Other Ambulatory Visit: Payer: Self-pay | Admitting: Family Medicine

## 2022-08-24 DIAGNOSIS — I10 Essential (primary) hypertension: Secondary | ICD-10-CM

## 2022-09-04 DIAGNOSIS — Z823 Family history of stroke: Secondary | ICD-10-CM | POA: Diagnosis not present

## 2022-09-04 DIAGNOSIS — I2510 Atherosclerotic heart disease of native coronary artery without angina pectoris: Secondary | ICD-10-CM | POA: Diagnosis not present

## 2022-09-04 DIAGNOSIS — Z973 Presence of spectacles and contact lenses: Secondary | ICD-10-CM | POA: Diagnosis not present

## 2022-09-04 DIAGNOSIS — N529 Male erectile dysfunction, unspecified: Secondary | ICD-10-CM | POA: Diagnosis not present

## 2022-09-04 DIAGNOSIS — Z950 Presence of cardiac pacemaker: Secondary | ICD-10-CM | POA: Diagnosis not present

## 2022-09-04 DIAGNOSIS — Z825 Family history of asthma and other chronic lower respiratory diseases: Secondary | ICD-10-CM | POA: Diagnosis not present

## 2022-09-04 DIAGNOSIS — Z818 Family history of other mental and behavioral disorders: Secondary | ICD-10-CM | POA: Diagnosis not present

## 2022-09-04 DIAGNOSIS — I10 Essential (primary) hypertension: Secondary | ICD-10-CM | POA: Diagnosis not present

## 2022-09-04 DIAGNOSIS — Z8546 Personal history of malignant neoplasm of prostate: Secondary | ICD-10-CM | POA: Diagnosis not present

## 2022-09-04 DIAGNOSIS — Z87891 Personal history of nicotine dependence: Secondary | ICD-10-CM | POA: Diagnosis not present

## 2022-09-04 DIAGNOSIS — K5900 Constipation, unspecified: Secondary | ICD-10-CM | POA: Diagnosis not present

## 2022-09-04 DIAGNOSIS — Z8249 Family history of ischemic heart disease and other diseases of the circulatory system: Secondary | ICD-10-CM | POA: Diagnosis not present

## 2022-09-04 DIAGNOSIS — Z008 Encounter for other general examination: Secondary | ICD-10-CM | POA: Diagnosis not present

## 2022-09-15 ENCOUNTER — Other Ambulatory Visit: Payer: Self-pay | Admitting: Family Medicine

## 2022-09-15 DIAGNOSIS — I10 Essential (primary) hypertension: Secondary | ICD-10-CM

## 2022-09-23 ENCOUNTER — Other Ambulatory Visit: Payer: Self-pay | Admitting: Family Medicine

## 2022-09-23 DIAGNOSIS — I10 Essential (primary) hypertension: Secondary | ICD-10-CM

## 2022-10-20 ENCOUNTER — Other Ambulatory Visit: Payer: Self-pay | Admitting: Family Medicine

## 2022-11-14 ENCOUNTER — Other Ambulatory Visit: Payer: Self-pay | Admitting: Family Medicine

## 2022-11-14 DIAGNOSIS — I10 Essential (primary) hypertension: Secondary | ICD-10-CM

## 2022-11-28 ENCOUNTER — Telehealth: Payer: Self-pay | Admitting: Family Medicine

## 2022-11-28 NOTE — Telephone Encounter (Signed)
Called the patient to inform that if this is something he would like to pursue he needs to schedule an appointment with his PCP to discuss. The patient was not aware of the test but will discuss further with his wife.

## 2022-11-28 NOTE — Telephone Encounter (Signed)
Called left message to call back, 

## 2022-11-28 NOTE — Telephone Encounter (Signed)
[employer] called to request CPT code of neurocognitive test that was recommended by provider. [employer] rep said patient's wife advised her that Dr Abner Greenspan recommended this test for her husband. They are trying to figure out how much this will cost. Please call her, Caley Farner, and provide the cpt code. Not sure which provider to send this too so sending it to both CMA's.

## 2022-11-29 DIAGNOSIS — H524 Presbyopia: Secondary | ICD-10-CM | POA: Diagnosis not present

## 2022-11-29 DIAGNOSIS — H52223 Regular astigmatism, bilateral: Secondary | ICD-10-CM | POA: Diagnosis not present

## 2022-11-30 ENCOUNTER — Ambulatory Visit (INDEPENDENT_AMBULATORY_CARE_PROVIDER_SITE_OTHER): Payer: Medicare HMO | Admitting: Family Medicine

## 2022-11-30 ENCOUNTER — Encounter: Payer: Self-pay | Admitting: Family Medicine

## 2022-11-30 VITALS — BP 119/70 | HR 63 | Temp 98.1°F | Ht 69.0 in | Wt 159.1 lb

## 2022-11-30 DIAGNOSIS — L209 Atopic dermatitis, unspecified: Secondary | ICD-10-CM | POA: Diagnosis not present

## 2022-11-30 DIAGNOSIS — G3184 Mild cognitive impairment, so stated: Secondary | ICD-10-CM | POA: Insufficient documentation

## 2022-11-30 DIAGNOSIS — R413 Other amnesia: Secondary | ICD-10-CM

## 2022-11-30 MED ORDER — TRIAMCINOLONE ACETONIDE 0.5 % EX OINT: 1.0000 | TOPICAL_OINTMENT | Freq: Two times a day (BID) | CUTANEOUS | 0 refills | Status: DC

## 2022-11-30 MED ORDER — DONEPEZIL HCL 5 MG PO TABS: 5.0000 mg | ORAL_TABLET | Freq: Every day | ORAL | 1 refills | Status: DC

## 2022-11-30 NOTE — Patient Instructions (Addendum)
If you do not hear anything about your referral in the next 1-2 weeks, call our office and ask for an update.  Try not to scratch as this can make things worse. Avoid scented products while dealing with this. You may resume when the itchiness resolves. Cold/cool compresses can help.   Take Metamucil or Benefiber daily.  The new pill is to prevent worsening, not revert back to your 20's.   Let us know if you need anything.

## 2022-11-30 NOTE — Progress Notes (Signed)
Chief Complaint  Patient presents with   Follow-up    Patient is having memory problems   Rash    Subjective: Patient is a 82 y.o. male here for memory issues.  Hx of worsening memory issues where someone will tell him something and he will forget within 10 min. He does not notice this all the time. CT head a few mo ago showed age related changes. Lab workup has been unremarkable. Mood stable. Sleeps relatively well.   Skin lesions Duration: 2 weeks Location: R thigh and LLE Pruritic? Yes Painful? No Drainage? No New soaps/lotions/topicals/detergents? No Sick contacts? No Other associated symptoms: has been growing Therapies tried thus far: various OTC itch creams  Past Medical History:  Diagnosis Date   Adenomatous colon polyp 06/05/11   Repeat 2018 per Dr. Leone Payor   Allergic angioedema    ACE inhibitor/beta blocker   Diverticulosis    Gastroenteritis 2012   Headache(784.0)    Hyperlipidemia    Hypertension    Influenza A 02/2011   Intermittent vertigo    Microhematuria 01/06/2012   Dr. Ernestine Conrad, urologist in [place], did cysto and bladder bx 06/24/12 and it was unremarkable   Prostate cancer [facility]) 2007   Dr. Gerome Sam ([facility])  Pt is s/p brachytherapy 2011.  Jan 2015 PSA 0.6 ng/ml.   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 Graciela Husbands)   Syncope    ? Malignant vasovagal syncope-Pacer placed    Objective: BP 119/70 (BP Location: Left Arm, Patient Position: Sitting, Cuff Size: Normal)   Pulse 63   Temp 98.1 F (36.7 C) (Oral)   Ht 5\' 9"  (1.753 m)   Wt 159 lb 2 oz (72.2 kg)   SpO2 97%   BMI 23.50 kg/m  General: Awake, appears stated age Skin: Slightly thickened and erythematous patch over the right medial distal portion of the thigh and the left medial lower leg.  Slight scale and excoriation.  There is no drainage, fluctuance, purulence, TTP, or excessive warmth. Lungs: No accessory muscle use Psych: Age appropriate judgment and insight,  normal affect and mood  Assessment and Plan: MCI (mild cognitive impairment) - Plan: Ambulatory referral to Neuropsychology, donepezil (ARICEPT) 5 MG tablet  Memory change - Plan: Ambulatory referral to Neuropsychology, donepezil (ARICEPT) 5 MG tablet  Atopic dermatitis, unspecified type - Plan: triamcinolone ointment (KENALOG) 0.5 %  1/2.  Chronic, worsening.  Refer to neuropsychology.  Start Aricept 5 mg daily.  He will consider adding Metamucil to balance out loose stools as a potential side effect.  He was counseled on this not improving things but preventing worsening.  Follow-up in 1 month to recheck. 3.  Twice daily Kenalog for up to 2 weeks.  Avoid scented products and scratching.  At least twice daily emollient use also recommended. The patient voiced understanding and agreement to the plan.  Jilda Roche [place], [place] 11/30/22  8:42 AM   Chief Complaint  Patient presents with   Follow-up    Patient is having memory problems   Rash    Billy Burns is a 82 y.o. male here for a skin complaint.

## 2022-12-04 ENCOUNTER — Emergency Department (HOSPITAL_COMMUNITY): Payer: Medicare HMO

## 2022-12-04 ENCOUNTER — Emergency Department (HOSPITAL_COMMUNITY)
Admission: EM | Admit: 2022-12-04 | Discharge: 2022-12-05 | Disposition: A | Discharge status: Home / Self Care | Payer: Medicare HMO

## 2022-12-04 ENCOUNTER — Other Ambulatory Visit: Payer: Self-pay

## 2022-12-04 DIAGNOSIS — Z950 Presence of cardiac pacemaker: Secondary | ICD-10-CM | POA: Diagnosis not present

## 2022-12-04 DIAGNOSIS — K4020 Bilateral inguinal hernia, without obstruction or gangrene, not specified as recurrent: Secondary | ICD-10-CM | POA: Diagnosis not present

## 2022-12-04 DIAGNOSIS — R1111 Vomiting without nausea: Secondary | ICD-10-CM | POA: Diagnosis not present

## 2022-12-04 DIAGNOSIS — Z79899 Other long term (current) drug therapy: Secondary | ICD-10-CM | POA: Diagnosis not present

## 2022-12-04 DIAGNOSIS — R21 Rash and other nonspecific skin eruption: Secondary | ICD-10-CM | POA: Insufficient documentation

## 2022-12-04 DIAGNOSIS — R55 Syncope and collapse: Secondary | ICD-10-CM | POA: Insufficient documentation

## 2022-12-04 DIAGNOSIS — Z20822 Contact with and (suspected) exposure to covid-19: Secondary | ICD-10-CM | POA: Insufficient documentation

## 2022-12-04 DIAGNOSIS — R112 Nausea with vomiting, unspecified: Secondary | ICD-10-CM | POA: Diagnosis not present

## 2022-12-04 DIAGNOSIS — R110 Nausea: Secondary | ICD-10-CM | POA: Diagnosis not present

## 2022-12-04 DIAGNOSIS — K529 Noninfective gastroenteritis and colitis, unspecified: Secondary | ICD-10-CM | POA: Diagnosis not present

## 2022-12-04 DIAGNOSIS — I10 Essential (primary) hypertension: Secondary | ICD-10-CM | POA: Insufficient documentation

## 2022-12-04 DIAGNOSIS — I959 Hypotension, unspecified: Secondary | ICD-10-CM | POA: Diagnosis not present

## 2022-12-04 DIAGNOSIS — K5730 Diverticulosis of large intestine without perforation or abscess without bleeding: Secondary | ICD-10-CM | POA: Diagnosis not present

## 2022-12-04 DIAGNOSIS — K449 Diaphragmatic hernia without obstruction or gangrene: Secondary | ICD-10-CM | POA: Diagnosis not present

## 2022-12-04 DIAGNOSIS — R109 Unspecified abdominal pain: Secondary | ICD-10-CM | POA: Diagnosis not present

## 2022-12-04 DIAGNOSIS — Z743 Need for continuous supervision: Secondary | ICD-10-CM | POA: Diagnosis not present

## 2022-12-04 LAB — CBC
HCT: 45.7 % (ref 39.0–52.0)
Hemoglobin: 14.8 g/dL (ref 13.0–17.0)
MCH: 29.3 pg (ref 26.0–34.0)
MCHC: 32.4 g/dL (ref 30.0–36.0)
MCV: 90.5 fL (ref 80.0–100.0)
Platelets: 245 10*3/uL (ref 150–400)
RBC: 5.05 MIL/uL (ref 4.22–5.81)
RDW: 13.4 % (ref 11.5–15.5)
WBC: 14.5 10*3/uL — ABNORMAL HIGH (ref 4.0–10.5)
nRBC: 0 % (ref 0.0–0.2)

## 2022-12-04 LAB — COMPREHENSIVE METABOLIC PANEL
ALT: 20 U/L (ref 0–44)
AST: 26 U/L (ref 15–41)
Albumin: 4.3 g/dL (ref 3.5–5.0)
Alkaline Phosphatase: 91 U/L (ref 38–126)
Anion gap: 9 (ref 5–15)
BUN: 13 mg/dL (ref 8–23)
CO2: 22 mmol/L (ref 22–32)
Calcium: 8.9 mg/dL (ref 8.9–10.3)
Chloride: 105 mmol/L (ref 98–111)
Creatinine, Ser: 0.82 mg/dL (ref 0.61–1.24)
GFR, Estimated: >60 mL/min (ref >60)
Glucose, Bld: 123 mg/dL — ABNORMAL HIGH (ref 70–99)
Potassium: 3.8 mmol/L (ref 3.5–5.1)
Sodium: 136 mmol/L (ref 135–145)
Total Bilirubin: 1.3 mg/dL — ABNORMAL HIGH (ref 0.3–1.2)
Total Protein: 7.8 g/dL (ref 6.5–8.1)

## 2022-12-04 LAB — RESP PANEL BY RT-PCR (RSV, FLU A&B, COVID)  RVPGX2
Influenza A by PCR: NEGATIVE
Influenza B by PCR: NEGATIVE
Resp Syncytial Virus by PCR: NEGATIVE
SARS Coronavirus 2 by RT PCR: NEGATIVE

## 2022-12-04 LAB — URINALYSIS, ROUTINE W REFLEX MICROSCOPIC
Bacteria, UA: NONE SEEN
Bilirubin Urine: NEGATIVE
Glucose, UA: NEGATIVE mg/dL
Ketones, ur: 20 mg/dL — AB
Leukocytes,Ua: NEGATIVE
Nitrite: NEGATIVE
Protein, ur: NEGATIVE mg/dL
Specific Gravity, Urine: 1.011 (ref 1.005–1.030)
pH: 6 (ref 5.0–8.0)

## 2022-12-04 LAB — LIPASE, BLOOD: Lipase: 21 U/L (ref 11–51)

## 2022-12-04 LAB — TROPONIN I (HIGH SENSITIVITY): Troponin I (High Sensitivity): 7 ng/L (ref <18)

## 2022-12-04 MED ORDER — ONDANSETRON HCL 4 MG/2ML IJ SOLN
4.0000 mg | Freq: Once | INTRAMUSCULAR | Status: AC
  Administered 2022-12-04: 4 mg via INTRAVENOUS
  Filled 2022-12-04: qty 2

## 2022-12-04 MED ORDER — LACTATED RINGERS IV BOLUS
1000.0000 mL | Freq: Once | INTRAVENOUS | Status: AC
  Administered 2022-12-04: 1000 mL via INTRAVENOUS

## 2022-12-04 MED ORDER — ONDANSETRON HCL 4 MG PO TABS: 4.0000 mg | ORAL_TABLET | Freq: Four times a day (QID) | ORAL | 0 refills | Status: AC

## 2022-12-04 MED ORDER — ONDANSETRON 4 MG PO TBDP: 4.0000 mg | ORAL_TABLET | Freq: Once | ORAL | Status: DC

## 2022-12-04 NOTE — Discharge Instructions (Signed)
You are seen emergency department for your nausea vomiting and passing out.  He did not have any life-threatening emergent injuries or illnesses that require hospitalization at this time.  We are prescribing you some nausea medicine to take.  Please try to maintain adequate hydration with frequent small sips of clear liquids.  Please follow-up with your primary doctor.  Turn immediately develop any fevers, chills, chest pain, shortness of breath, abdominal pain, or any new or worsening symptoms that are concerning to you.

## 2022-12-04 NOTE — ED Provider Notes (Addendum)
[place] EMERGENCY DEPARTMENT AT [facility] Provider Note   CSN: [id number] Arrival date & time: 12/04/22  1635     History  Chief Complaint  Patient presents with   Emesis    Billy Burns is a 82 y.o. male.  Who is a 82 year old male presenting emergency department for syncope.  Patient reports nausea vomiting diarrhea since yesterday.  Reports being relatively okay this morning, then this afternoon reports that he had several syncopal episodes occurring when he stands up. He would feel lightheaded and pass out. Would lower himself to the ground first. Did not hit his head.  Does have some abrasions to his arms.  Denies any chest pain or shortness of breath.  Denies abdominal pain.  Only nausea.  No sick contacts.  Also complaining of rash to his inside right lower extremity for which his primary doctor gave medications for several days ago, but he is unsure what medication that was.   Emesis      Home Medications Prior to Admission medications   Medication Sig Start Date End Date Taking? Authorizing Provider  ondansetron (ZOFRAN) 4 MG tablet Take 1 tablet (4 mg total) by mouth every 6 (six) hours for 5 days. 12/04/22 12/09/22 Yes Torrian Canion, Harmon Dun, DO  acetaminophen (TYLENOL) 325 MG tablet Take 325 mg by mouth every 6 (six) hours as needed for moderate pain.     [provider]  Alfalfa 500 MG TABS Take 9-12 tablets by mouth daily. 9-12 TABS/DAY     [provider]  amLODipine (NORVASC) 5 MG tablet Take 1 tablet by mouth once daily 11/14/22   Sharlene Dory, DO  b complex vitamins tablet Take 1 tablet by mouth daily.    [provider]  carvedilol (COREG) 25 MG tablet TAKE 1 TABLET BY MOUTH TWICE DAILY WITH A MEAL 10/22/22   Wendling, Jilda Roche, DO  cloNIDine (CATAPRES) 0.1 MG tablet Take 1 tablet by mouth twice daily 10/22/22   Carmelia Roller, Jilda Roche, DO  donepezil (ARICEPT) 5 MG tablet Take 1 tablet (5 mg total) by mouth at  bedtime. 11/30/22   Wendling, Jilda Roche, DO  Flaxseed, Linseed, (FLAX SEED OIL PO) Take 1,000 mg by mouth daily.     [provider]  levocetirizine (XYZAL) 5 MG tablet Take 1 tablet (5 mg total) by mouth every evening. 06/11/22   Sharlene Dory, DO  NON FORMULARY Take 1 tablet by mouth in the morning and at bedtime. Prostate Ess plus    [provider]  OVER THE COUNTER MEDICATION Take 2 tablets by mouth every evening. HerbLax    [provider]  Probiotic Product (PROBIOTIC DAILY PO) Take 1 capsule by mouth daily.    [provider]  triamcinolone ointment (KENALOG) 0.5 % Apply 1 Application topically 2 (two) times daily. 11/30/22   Sharlene Dory, DO  vitamin C (ASCORBIC ACID) 500 MG tablet Take 500 mg by mouth 2 (two) times daily.     [provider]  vitamin E 180 MG (400 UNITS) capsule Take 400 Units by mouth daily.    [provider]      Allergies    Ace inhibitors, Aspirin, Sulfa antibiotics, and Toprol xl [metoprolol succinate]    Review of Systems   Review of Systems  Gastrointestinal:  Positive for vomiting.    Physical Exam Updated Vital Signs BP (!) 160/76 (BP Location: Right Arm)   Pulse 85   Temp 98 F (36.7 C) (Oral)  Resp 17   Ht 5\' 9"  (1.753 m)   Wt 72.4 kg   SpO2 98%   BMI 23.57 kg/m  Physical Exam Vitals and nursing note reviewed.  Constitutional:      General: He is not in acute distress.    Appearance: He is not toxic-appearing.  HENT:     Head: Normocephalic and atraumatic.     Nose: Nose normal.     Mouth/Throat:     Mouth: Mucous membranes are moist.  Eyes:     Conjunctiva/sclera: Conjunctivae normal.  Cardiovascular:     Rate and Rhythm: Normal rate and regular rhythm.  Pulmonary:     Effort: Pulmonary effort is normal.     Breath sounds: Normal breath sounds.  Abdominal:     General: Abdomen is flat. There is no distension.     Palpations: Abdomen is soft.      Tenderness: There is no abdominal tenderness. There is no guarding or rebound.  Musculoskeletal:        General: Normal range of motion.  Skin:    General: Skin is warm and dry.     Capillary Refill: Capillary refill takes less than 2 seconds.  Neurological:     General: No focal deficit present.     Mental Status: He is alert.  Psychiatric:        Mood and Affect: Mood normal.        Behavior: Behavior normal.     ED Results / Procedures / Treatments   Labs (all labs ordered are listed, but only abnormal results are displayed) Labs Reviewed  COMPREHENSIVE METABOLIC PANEL - Abnormal; Notable for the following components:      Result Value   Glucose, Bld 123 (*)    Total Bilirubin 1.3 (*)    All other components within normal limits  CBC - Abnormal; Notable for the following components:   WBC 14.5 (*)    All other components within normal limits  URINALYSIS, ROUTINE W REFLEX MICROSCOPIC - Abnormal; Notable for the following components:   Hgb urine dipstick MODERATE (*)    Ketones, ur 20 (*)    All other components within normal limits  RESP PANEL BY RT-PCR (RSV, FLU A&B, COVID)  RVPGX2  LIPASE, BLOOD  TROPONIN I (HIGH SENSITIVITY)    EKG None  Radiology CT Renal Stone Study  Result Date: 12/04/2022 CLINICAL DATA:  Abdominal pain.  Concern for kidney stone. EXAM: CT ABDOMEN AND PELVIS WITHOUT CONTRAST TECHNIQUE: Multidetector CT imaging of the abdomen and pelvis was performed following the standard protocol without IV contrast. RADIATION DOSE REDUCTION: This exam was performed according to the departmental dose-optimization program which includes automated exposure control, adjustment of the mA and/or kV according to patient size and/or use of iterative reconstruction technique. COMPARISON:  CT abdomen pelvis dated 05/25/2013. FINDINGS: Evaluation of this exam is limited in the absence of intravenous contrast. Lower chest: Minimal bibasilar subpleural atelectasis. The  visualized lung bases are otherwise clear. Cardiac pacemaker wires noted. No intra-abdominal free air.  Small free fluid in the pelvis. Hepatobiliary: The liver is unremarkable. No biliary dilatation. The gallbladder is unremarkable. Pancreas: Unremarkable. No pancreatic ductal dilatation or surrounding inflammatory changes. Spleen: Normal in size without focal abnormality. Adrenals/Urinary Tract: The adrenal glands are unremarkable. There is no hydronephrosis or nephrolithiasis on either side. The visualized ureters and urinary bladder unremarkable. Stomach/Bowel: There is a small hiatal hernia. There is sigmoid diverticulosis and scattered colonic diverticula without active inflammatory changes. There is no bowel  obstruction or active inflammation. The appendix is normal. Vascular/Lymphatic: Moderate aortoiliac atherosclerotic disease. The IVC is unremarkable. No portal venous gas. There is no adenopathy. Reproductive: Prostate brachytherapy seeds. Other: Small fat containing bilateral inguinal hernias. No fluid collection. Musculoskeletal: Osteopenia with degenerative changes. No acute osseous pathology. IMPRESSION: 1. No hydronephrosis or nephrolithiasis. 2. Colonic diverticulosis. No bowel obstruction. Normal appendix. 3.  Aortic Atherosclerosis (ICD10-I70.0). Electronically Signed   By: Elgie Collard M.D.   On: 12/04/2022 19:46   DG Chest Portable 1 View  Result Date: 12/04/2022 CLINICAL DATA:  Syncope. EXAM: PORTABLE CHEST 1 VIEW COMPARISON:  May 11, 2019. FINDINGS: The heart size and mediastinal contours are within normal limits. Left-sided pacemaker is unchanged position. Lungs are clear. The visualized skeletal structures are unremarkable. IMPRESSION: No active disease. Electronically Signed   By: Lupita Raider M.D.   On: 12/04/2022 18:05    Procedures Procedures    Medications Ordered in ED Medications  lactated ringers bolus 1,000 mL (0 mLs Intravenous Stopped 12/04/22 2255)   ondansetron (ZOFRAN) injection 4 mg (4 mg Intravenous Given 12/04/22 1719)    ED Course/ Medical Decision Making/ A&P Clinical Course as of 12/04/22 2334  Tue Dec 04, 2022  2200 Patient workup largely reassuring.  He has a mild leukocytosis that goes along with a viral gastroenteritis type picture with his nausea and vomiting x 2 days.  He has benign abdominal exam.  He did have some hematuria on his UA, concern for possible stone.  However CT scan negative for acute pathology.  Flu COVID negative.  No significant metabolic derangements to help explain his nausea vomiting.  His EKG, chest x-ray and troponin also reassuring.  ACS unlikely.  No pneumonia or pneumothorax on CXR. Patient received IV fluids and Zofran.  Tolerating p.o. in emergency department.  I personally ambulated patient and he is asymptomatic currently.  I suspect his syncopal episode is vasovagal versus orthostatic.  Given patient's negative workup provide he is stable for discharge at this time.  Follow-up outpatient. [TY]    Clinical Course User Index [TY] Coral Spikes, DO                                 Medical Decision Making Is a well-appearing 82 year old male presenting emergency department for syncope in the setting of nausea vomiting diarrhea.  He is afebrile nontachycardic he is hypertensive.  Physical exam reassuring with some minor skin abrasions to forearms, but no overt injuries.  Appears to be normal sinus rhythm on the monitor at a rate in the upper 70s low 80s on my independent interpretation.  Will get syncope screening labs, EKG chest x-ray.  Will give IV fluids.  Check orthostatics.  Will give Zofran for nausea.  See ED course for further MDM disposition.  Amount and/or Complexity of Data Reviewed Labs: ordered. Radiology: ordered. ECG/medicine tests: ordered.  Risk Prescription drug management.   With his nausea and vomitting, suspect viral etiology. Evaluated with flu/covid. They were negative.         Final Clinical Impression(s) / ED Diagnoses Final diagnoses:  Gastroenteritis    Rx / DC Orders ED Discharge Orders          Ordered    ondansetron (ZOFRAN) 4 MG tablet  Every 6 hours        12/04/22 2203              Coral Spikes, DO  12/04/22 2322    Coral Spikes, DO 12/04/22 2335    Coral Spikes, DO 12/13/22 [phone]

## 2022-12-04 NOTE — ED Notes (Signed)
Pt ambulated around the room, a bit shaky when it comes to turns and stated he feels that he is about to pass out.

## 2022-12-04 NOTE — ED Triage Notes (Addendum)
Pt BIB EMS from home c/o n/v/d x 2 days. Pt actively vomiting in triage.

## 2022-12-14 ENCOUNTER — Other Ambulatory Visit: Payer: Self-pay | Admitting: Family Medicine

## 2022-12-14 DIAGNOSIS — I10 Essential (primary) hypertension: Secondary | ICD-10-CM

## 2022-12-25 ENCOUNTER — Ambulatory Visit (INDEPENDENT_AMBULATORY_CARE_PROVIDER_SITE_OTHER): Payer: Medicare HMO | Admitting: Family Medicine

## 2022-12-25 ENCOUNTER — Encounter: Payer: Self-pay | Admitting: Family Medicine

## 2022-12-25 VITALS — BP 138/80 | HR 63 | Temp 98.3°F | Ht 69.0 in | Wt 157.4 lb

## 2022-12-25 DIAGNOSIS — F339 Major depressive disorder, recurrent, unspecified: Secondary | ICD-10-CM | POA: Diagnosis not present

## 2022-12-25 DIAGNOSIS — L308 Other specified dermatitis: Secondary | ICD-10-CM

## 2022-12-25 DIAGNOSIS — L989 Disorder of the skin and subcutaneous tissue, unspecified: Secondary | ICD-10-CM

## 2022-12-25 DIAGNOSIS — R413 Other amnesia: Secondary | ICD-10-CM

## 2022-12-25 LAB — COMPREHENSIVE METABOLIC PANEL
ALT: 77 U/L — ABNORMAL HIGH (ref 0–53)
AST: 27 U/L (ref 0–37)
Albumin: 4.1 g/dL (ref 3.5–5.2)
Alkaline Phosphatase: 284 U/L — ABNORMAL HIGH (ref 39–117)
BUN: 15 mg/dL (ref 6–23)
CO2: 28 meq/L (ref 19–32)
Calcium: 9.1 mg/dL (ref 8.4–10.5)
Chloride: 103 meq/L (ref 96–112)
Creatinine, Ser: 1.18 mg/dL (ref 0.40–1.50)
GFR: 56.97 mL/min — ABNORMAL LOW (ref >60.00)
Glucose, Bld: 99 mg/dL (ref 70–99)
Potassium: 4.1 meq/L (ref 3.5–5.1)
Sodium: 138 meq/L (ref 135–145)
Total Bilirubin: 0.6 mg/dL (ref 0.2–1.2)
Total Protein: 6.9 g/dL (ref 6.0–8.3)

## 2022-12-25 LAB — CBC
HCT: 40.5 % (ref 39.0–52.0)
Hemoglobin: 13 g/dL (ref 13.0–17.0)
MCHC: 32.2 g/dL (ref 30.0–36.0)
MCV: 91.7 fl (ref 78.0–100.0)
Platelets: 346 K/uL (ref 150.0–400.0)
RBC: 4.42 Mil/uL (ref 4.22–5.81)
RDW: 14.4 % (ref 11.5–15.5)
WBC: 6.2 K/uL (ref 4.0–10.5)

## 2022-12-25 LAB — VITAMIN B12: Vitamin B-12: 1059 pg/mL — ABNORMAL HIGH (ref 211–911)

## 2022-12-25 LAB — TSH: TSH: 1.93 u[IU]/mL (ref 0.35–5.50)

## 2022-12-25 MED ORDER — CLOTRIMAZOLE-BETAMETHASONE 1-0.05 % EX CREA
1.0000 | TOPICAL_CREAM | Freq: Every day | CUTANEOUS | 0 refills | Status: AC
Start: 2022-12-25 — End: ?

## 2022-12-25 MED ORDER — SERTRALINE HCL 25 MG PO TABS
25.0000 mg | ORAL_TABLET | Freq: Every day | ORAL | 3 refills | Status: DC
Start: 2022-12-25 — End: 2023-04-24

## 2022-12-25 NOTE — Progress Notes (Signed)
Chief Complaint  Patient presents with   Follow-up    From ER     Subjective: Patient is a 82 y.o. male here for ER follow-up.  He is here with his wife.  I most recently saw the patient on 11/30/2022.  He was prescribed Aricept to help with possible dementia.  He was also referred to the neuropsychology team.  He has not had his appointment with them yet.  He took Aricept for 3 out of 4 days and had nausea, vomiting, lightheadedness, diarrhea, and 2 falls.  He went to the emergency department and received IV fluids and Zofran.  He did not resume the medication and has had no issues since then.  He is eating and drinking normally again.  His memory seems to get worse with time.  A year and a half ago, he did have a negative lab workup and CT of the head.  He has difficulty finding words and remembering basic things.  He is getting to the point where he is also forgetting the names of his children.  When he drives, he does not forget where he is or where he is going.  He sleeps well.  He stays active with frequent walking.  He tries to do puzzles and games to keep himself mentally active.  No known family history of dementia.  He told me his mood was stable at the last visit but with his wife here, it is revealed that he has a longstanding history of uncontrolled anxiety/depression.  He is not following with a therapist and does not take any medication routinely.  No homicidal or suicidal ideation.  No self-medication.  The patient was also diagnosed with a skin rash at his last visit.  He was prescribed triamcinolone 0.5% cream, apply twice daily.  He reports compliance and no adverse effects.  He is also using an over-the-counter lotion from [employer] that is also helping.  It has not gone away though.  It is not spreading and there is no drainage from it.  The patient reports a small lesion on his nose that has been there for the past year.  It will scab over and sometimes bleed and then steadily heal.   No new lotions, soaps, topicals, detergents, drainage, pain, or change in size.  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 Leone Payor   Allergic angioedema    ACE inhibitor/beta blocker   Diverticulosis    Gastroenteritis 2012   Headache(784.0)    Hyperlipidemia    Hypertension    Influenza A 02/2011   Intermittent vertigo    Microhematuria 01/06/2012   Dr. Ernestine Conrad, urologist in [place], did cysto and bladder bx 06/24/12 and it was unremarkable   Prostate cancer [facility]) 2007   Dr. Gerome Sam ([facility]  Pt is s/p brachytherapy 2011.  Jan 2015 PSA 0.6 ng/ml.   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 Graciela Husbands)   Syncope    ? Malignant vasovagal syncope-Pacer placed    Objective: BP 138/80 (BP Location: Left Arm, Patient Position: Sitting, Cuff Size: Normal)   Pulse 63   Temp 98.3 F (36.8 C) (Oral)   Ht 5\' 9"  (1.753 m)   Wt 157 lb 6 oz (71.4 kg)   SpO2 97%   BMI 23.24 kg/m  General: Awake, appears stated age Heart: RRR, no LE edema Lungs: CTAB, no rales, wheezes or rhonchi. No accessory muscle use  Skin: See below; on his right distal medial thigh, there is a pinkish patch with the scale overlying it.  There is a similar lesion over the left postero-medial calf Psych: Age appropriate judgment and insight, normal affect and mood    Assessment and Plan: Memory loss - Plan: MR Brain Wo Contrast, CBC, Comprehensive metabolic panel, TSH, B12  Depression, recurrent (HCC) - Plan: sertraline (ZOLOFT) 25 MG tablet  Pruritic dermatitis - Plan: clotrimazole-betamethasone (LOTRISONE) cream  Skin lesion - Plan: Ambulatory referral to Dermatology  Seems to be getting worse.  We will keep him off of Aricept given the side effects he experienced.  Could consider memantine down the road.  Could be 2/2 given new piece of hx. Tx as below. Will ck MRI brain. Pending neuropsych eval. Repeat labs as below.  Start Zoloft  25 mg/d. Counseling info provided. Counseled on exercise.  I do believe this could be contributing to the memory issues.  F/u in 1 mo.  Trial Lotrisone. Refer derm. Could consider punch biopsy if no better.  Refer derm, concern for SCC. Pic as above. Will shave biopsy if we can't get her in. Referral was placed urgent. The patient and his spouse voiced understanding and agreement to the plan.  I spent 48 minutes with the patient and his spouse discussing the above plans in addition to reviewing his chart on the same day of the visit.  Jilda Roche Utting, DO 12/25/22  11:42 AM

## 2022-12-25 NOTE — Patient Instructions (Signed)
If you do not hear anything about your referral in the next 1-2 weeks, call our office and ask for an update.  Give [place] 2-3 business days to get the results of your labs back.   We will be in touch with your MRI results.   Please consider counseling. Contact [phone] to schedule an appointment or inquire about cost/insurance coverage.  [facility] located at [address], [address], [place], [place].  Phone number = [phone].  Dr. Regan Lemming - Adult Psychiatry.    [facility] located at [address] [place], [place], [place]. Phone number = [phone].   [facility] located at [address] [place], [place].  Phone number = [phone].   [facility] located at [address] [place], [place], [place].  Phone number = [phone].  Aim to do some physical exertion for 150 minutes per week. This is typically divided into 5 days per week, 30 minutes per day. The activity should be enough to get your heart rate up. Anything is better than nothing if you have time constraints.  Let us know if you need anything.

## 2022-12-26 ENCOUNTER — Other Ambulatory Visit: Payer: Self-pay | Admitting: Family Medicine

## 2022-12-26 DIAGNOSIS — R7401 Elevation of levels of liver transaminase levels: Secondary | ICD-10-CM

## 2022-12-26 DIAGNOSIS — R748 Abnormal levels of other serum enzymes: Secondary | ICD-10-CM

## 2023-01-01 ENCOUNTER — Other Ambulatory Visit (INDEPENDENT_AMBULATORY_CARE_PROVIDER_SITE_OTHER): Payer: Medicare HMO

## 2023-01-01 DIAGNOSIS — R748 Abnormal levels of other serum enzymes: Secondary | ICD-10-CM

## 2023-01-01 DIAGNOSIS — R7401 Elevation of levels of liver transaminase levels: Secondary | ICD-10-CM

## 2023-01-01 DIAGNOSIS — D1801 Hemangioma of skin and subcutaneous tissue: Secondary | ICD-10-CM | POA: Diagnosis not present

## 2023-01-01 DIAGNOSIS — D485 Neoplasm of uncertain behavior of skin: Secondary | ICD-10-CM | POA: Diagnosis not present

## 2023-01-01 DIAGNOSIS — L814 Other melanin hyperpigmentation: Secondary | ICD-10-CM | POA: Diagnosis not present

## 2023-01-01 LAB — HEPATIC FUNCTION PANEL
ALT: 31 U/L (ref 0–53)
AST: 23 U/L (ref 0–37)
Albumin: 4.3 g/dL (ref 3.5–5.2)
Alkaline Phosphatase: 208 U/L — ABNORMAL HIGH (ref 39–117)
Bilirubin, Direct: 0.2 mg/dL (ref 0.0–0.3)
Total Bilirubin: 0.8 mg/dL (ref 0.2–1.2)
Total Protein: 7.2 g/dL (ref 6.0–8.3)

## 2023-01-01 LAB — IBC + FERRITIN
Ferritin: 168 ng/mL (ref 22.0–322.0)
Iron: 112 ug/dL (ref 42–165)
Saturation Ratios: 30.7 % (ref 20.0–50.0)
TIBC: 365.4 ug/dL (ref 250.0–450.0)
Transferrin: 261 mg/dL (ref 212.0–360.0)

## 2023-01-01 LAB — GAMMA GT: GGT: 49 U/L (ref 7–51)

## 2023-01-03 ENCOUNTER — Other Ambulatory Visit: Payer: Self-pay | Admitting: Family Medicine

## 2023-01-03 DIAGNOSIS — R413 Other amnesia: Secondary | ICD-10-CM

## 2023-01-04 NOTE — Progress Notes (Signed)
Pt did not answer phone for AWV.   This encounter was created in error - please disregard.

## 2023-01-05 ENCOUNTER — Ambulatory Visit (HOSPITAL_BASED_OUTPATIENT_CLINIC_OR_DEPARTMENT_OTHER)
Admission: RE | Admit: 2023-01-05 | Discharge: 2023-01-05 | Disposition: A | Discharge status: Home / Self Care | Payer: Medicare HMO | Source: Ambulatory Visit | Attending: Family Medicine | Admitting: Family Medicine

## 2023-01-05 DIAGNOSIS — R413 Other amnesia: Secondary | ICD-10-CM | POA: Insufficient documentation

## 2023-01-05 LAB — HEPATITIS B SURFACE ANTIGEN: Hepatitis B Surface Ag: NONREACTIVE

## 2023-01-05 LAB — ALKALINE PHOSPHATASE ISOENZYMES
Alkaline phosphatase (APISO): 188 U/L — ABNORMAL HIGH (ref 35–144)
Bone Isoenzymes: 20 % — ABNORMAL LOW (ref 28–66)
Intestinal Isoenzymes: 0 % — ABNORMAL LOW (ref 1–24)
Liver Isoenzymes: 80 % — ABNORMAL HIGH (ref 25–69)
Macrohepatic isoenzymes: 0 % (ref <=0)
Placental isoenzymes: 0 % (ref <=0)

## 2023-01-05 LAB — HEPATITIS C ANTIBODY: Hepatitis C Ab: NONREACTIVE

## 2023-01-07 ENCOUNTER — Other Ambulatory Visit: Payer: Self-pay | Admitting: Family Medicine

## 2023-01-07 DIAGNOSIS — R748 Abnormal levels of other serum enzymes: Secondary | ICD-10-CM

## 2023-01-12 ENCOUNTER — Other Ambulatory Visit: Payer: Self-pay | Admitting: Family Medicine

## 2023-01-12 DIAGNOSIS — I10 Essential (primary) hypertension: Secondary | ICD-10-CM

## 2023-01-15 ENCOUNTER — Telehealth (HOSPITAL_BASED_OUTPATIENT_CLINIC_OR_DEPARTMENT_OTHER): Payer: Self-pay

## 2023-01-16 ENCOUNTER — Ambulatory Visit: Payer: Medicare HMO | Admitting: Family Medicine

## 2023-01-16 ENCOUNTER — Encounter: Payer: Self-pay | Admitting: Family Medicine

## 2023-01-16 VITALS — BP 112/64 | HR 68 | Temp 98.0°F | Ht 69.0 in | Wt 155.5 lb

## 2023-01-16 DIAGNOSIS — I10 Essential (primary) hypertension: Secondary | ICD-10-CM

## 2023-01-16 DIAGNOSIS — F339 Major depressive disorder, recurrent, unspecified: Secondary | ICD-10-CM | POA: Diagnosis not present

## 2023-01-16 NOTE — Progress Notes (Signed)
Chief Complaint  Patient presents with   Follow-up    6 month     Subjective Billy Burns is a 82 y.o. male who presents for hypertension follow up. He does not monitor home blood pressures. He is compliant with medications- Coreg 25 mg bid, clonidine 0.1 mg bid, Norvasc 5 mg/d. Patient has these side effects of medication: none He is adhering to a healthy diet overall. Current exercise: walking No CP or SOB.   Depression, recurrent Started on Zoloft 25 mg/d. Reports compliance, no AE's. Reports improvement in his mood, having less worry. A message from his wife relayed that he is no longer having the mood swings. He is following with a therapist. No SI or HI. No self medication.    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 Leone Payor   Allergic angioedema    ACE inhibitor/beta blocker   Diverticulosis    Gastroenteritis 2012   Headache(784.0)    Hyperlipidemia    Hypertension    Influenza A 02/2011   Intermittent vertigo    Microhematuria 01/06/2012   Dr. Ernestine Conrad, urologist in [place], did cysto and bladder bx 06/24/12 and it was unremarkable   Prostate cancer [facility]) 2007   Dr. Gerome Sam ([facility])  Pt is s/p brachytherapy 2011.  Jan 2015 PSA 0.6 ng/ml.   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 Graciela Husbands)   Syncope    ? Malignant vasovagal syncope-Pacer placed    Exam BP 112/64 (BP Location: Left Arm, Patient Position: Sitting, Cuff Size: Normal)   Pulse 68   Temp 98 F (36.7 C) (Oral)   Ht 5\' 9"  (1.753 m)   Wt 155 lb 8 oz (70.5 kg)   SpO2 96%   BMI 22.96 kg/m  General:  well developed, well nourished, in no apparent distress Heart: RRR, no bruits, no LE edema Lungs: clear to auscultation, no accessory muscle use Psych: well oriented with normal range of affect and appropriate judgment/insight  Essential hypertension  Depression, recurrent (HCC)  Chronic, stable. Cont Coreg 25 mg/d,  Norvasc 5 mg/d, clonidine 0.1 mg bid. Counseled on diet and exercise. Chronic, stable. Cont Zoloft 25 mg/d.  F/u in 6 mo. The patient voiced understanding and agreement to the plan.  Jilda Roche [place]iser, DO 01/16/23  8:21 AM

## 2023-01-16 NOTE — Patient Instructions (Addendum)
Keep the diet clean and stay active.  We ordered the ultrasound to get a better look at the liver.   We will stay on the Zoloft for now.   Let us know if you need anything.

## 2023-01-20 ENCOUNTER — Other Ambulatory Visit: Payer: Self-pay | Admitting: Family Medicine

## 2023-01-24 ENCOUNTER — Other Ambulatory Visit: Payer: Self-pay | Admitting: Family Medicine

## 2023-01-29 ENCOUNTER — Ambulatory Visit (HOSPITAL_BASED_OUTPATIENT_CLINIC_OR_DEPARTMENT_OTHER)
Admission: RE | Admit: 2023-01-29 | Discharge: 2023-01-29 | Disposition: A | Discharge status: Home / Self Care | Payer: Medicare HMO | Source: Ambulatory Visit | Attending: Family Medicine | Admitting: Family Medicine

## 2023-01-29 DIAGNOSIS — R748 Abnormal levels of other serum enzymes: Secondary | ICD-10-CM

## 2023-01-30 ENCOUNTER — Other Ambulatory Visit: Payer: Self-pay

## 2023-01-30 DIAGNOSIS — R7401 Elevation of levels of liver transaminase levels: Secondary | ICD-10-CM

## 2023-01-30 DIAGNOSIS — I10 Essential (primary) hypertension: Secondary | ICD-10-CM

## 2023-02-09 ENCOUNTER — Other Ambulatory Visit: Payer: Self-pay | Admitting: Family Medicine

## 2023-02-09 DIAGNOSIS — I10 Essential (primary) hypertension: Secondary | ICD-10-CM

## 2023-02-26 DIAGNOSIS — F0390 Unspecified dementia without behavioral disturbance: Secondary | ICD-10-CM | POA: Diagnosis not present

## 2023-02-28 DIAGNOSIS — D485 Neoplasm of uncertain behavior of skin: Secondary | ICD-10-CM | POA: Diagnosis not present

## 2023-02-28 DIAGNOSIS — L905 Scar conditions and fibrosis of skin: Secondary | ICD-10-CM | POA: Diagnosis not present

## 2023-03-05 ENCOUNTER — Other Ambulatory Visit: Payer: Medicare HMO

## 2023-03-12 ENCOUNTER — Other Ambulatory Visit (INDEPENDENT_AMBULATORY_CARE_PROVIDER_SITE_OTHER): Payer: Medicare HMO

## 2023-03-12 DIAGNOSIS — I10 Essential (primary) hypertension: Secondary | ICD-10-CM

## 2023-03-12 DIAGNOSIS — R7401 Elevation of levels of liver transaminase levels: Secondary | ICD-10-CM | POA: Diagnosis not present

## 2023-03-12 LAB — LIPID PANEL
Cholesterol: 199 mg/dL (ref 0–200)
HDL: 49.1 mg/dL (ref >39.00)
LDL Cholesterol: 133 mg/dL — ABNORMAL HIGH (ref 0–99)
NonHDL: 149.69
Total CHOL/HDL Ratio: 4
Triglycerides: 81 mg/dL (ref 0.0–149.0)
VLDL: 16.2 mg/dL (ref 0.0–40.0)

## 2023-03-12 LAB — HEPATIC FUNCTION PANEL
ALT: 17 U/L (ref 0–53)
AST: 21 U/L (ref 0–37)
Albumin: 4.4 g/dL (ref 3.5–5.2)
Alkaline Phosphatase: 108 U/L (ref 39–117)
Bilirubin, Direct: 0.2 mg/dL (ref 0.0–0.3)
Total Bilirubin: 1 mg/dL (ref 0.2–1.2)
Total Protein: 7 g/dL (ref 6.0–8.3)

## 2023-03-14 DIAGNOSIS — C44311 Basal cell carcinoma of skin of nose: Secondary | ICD-10-CM | POA: Diagnosis not present

## 2023-04-21 ENCOUNTER — Other Ambulatory Visit: Payer: Self-pay | Admitting: Family Medicine

## 2023-04-24 ENCOUNTER — Other Ambulatory Visit: Payer: Self-pay | Admitting: Family Medicine

## 2023-04-24 DIAGNOSIS — F339 Major depressive disorder, recurrent, unspecified: Secondary | ICD-10-CM

## 2023-05-06 DIAGNOSIS — L821 Other seborrheic keratosis: Secondary | ICD-10-CM | POA: Diagnosis not present

## 2023-05-06 DIAGNOSIS — D1801 Hemangioma of skin and subcutaneous tissue: Secondary | ICD-10-CM | POA: Diagnosis not present

## 2023-05-06 DIAGNOSIS — Z85828 Personal history of other malignant neoplasm of skin: Secondary | ICD-10-CM | POA: Diagnosis not present

## 2023-05-06 DIAGNOSIS — L814 Other melanin hyperpigmentation: Secondary | ICD-10-CM | POA: Diagnosis not present

## 2023-05-12 DIAGNOSIS — F0390 Unspecified dementia without behavioral disturbance: Secondary | ICD-10-CM | POA: Diagnosis not present

## 2023-05-12 DIAGNOSIS — I440 Atrioventricular block, first degree: Secondary | ICD-10-CM | POA: Diagnosis not present

## 2023-05-12 DIAGNOSIS — R55 Syncope and collapse: Secondary | ICD-10-CM | POA: Diagnosis not present

## 2023-05-12 DIAGNOSIS — Z743 Need for continuous supervision: Secondary | ICD-10-CM | POA: Diagnosis not present

## 2023-05-12 DIAGNOSIS — Z950 Presence of cardiac pacemaker: Secondary | ICD-10-CM | POA: Diagnosis not present

## 2023-05-12 DIAGNOSIS — I499 Cardiac arrhythmia, unspecified: Secondary | ICD-10-CM | POA: Diagnosis not present

## 2023-05-12 DIAGNOSIS — R001 Bradycardia, unspecified: Secondary | ICD-10-CM | POA: Diagnosis not present

## 2023-05-13 DIAGNOSIS — I440 Atrioventricular block, first degree: Secondary | ICD-10-CM | POA: Diagnosis not present

## 2023-05-21 ENCOUNTER — Ambulatory Visit: Payer: Medicare HMO | Admitting: Family Medicine

## 2023-07-27 ENCOUNTER — Other Ambulatory Visit: Payer: Self-pay | Admitting: Family Medicine

## 2023-07-27 DIAGNOSIS — F339 Major depressive disorder, recurrent, unspecified: Secondary | ICD-10-CM

## 2023-08-01 ENCOUNTER — Telehealth: Payer: Self-pay | Admitting: Emergency Medicine

## 2023-08-01 NOTE — Telephone Encounter (Signed)
 Copied from CRM [id number]. Topic: Clinical - Medical Advice >> Aug 01, 2023  9:34 AM [name] wrote: Reason for CRM: Patients daughter(Krystal) called to inform PCP that the patient took 3 of his sertraline  (ZOLOFT ) 25 MG tablets this morning when he is only supposed to take 1 a day; [name] would like to speak to the PCP or nurse about what side effects could happen; please follow up with Krystal #[phone]

## 2023-08-01 NOTE — Telephone Encounter (Signed)
 Only 3 should be fine. Maybe a slightly upset stomach but this isn't a terrible situation. Ty.

## 2023-08-02 NOTE — Telephone Encounter (Signed)
 Called pt daughter back lvm message to call our office back if she has any question.

## 2023-08-06 ENCOUNTER — Ambulatory Visit: Payer: Self-pay

## 2023-08-06 ENCOUNTER — Ambulatory Visit: Admitting: Family Medicine

## 2023-08-06 NOTE — Telephone Encounter (Signed)
  Chief Complaint: right groin pain and swelling Symptoms: golfball size swelling to right groin (warm and painful) Frequency: x today Pertinent Negatives: Patient denies sores/scratches/cuts in the area, pulsating at site, hx of abscess or hernia Disposition: [] ED /[] Urgent Care (no appt availability in office) / [x] Appointment(In office/virtual)/ []  [facility]/ [] Home Care/ [] Refused Recommended Disposition /[] [place] [place] [facility]/ []  Follow-up with PCP Additional Notes: Patient's daughter, [name], calling in for triage. She states this morning the patient noticed swelling to his right groin and complains of pain when bending to sit down or stand up. She is giving patient 1,000mg  of tylenol  for pain now at this time and is agreeable to an acute visit with PCP this morning.  Copied from CRM [id number]. Topic: Clinical - Red Word Triage >> Aug 06, 2023  7:43 AM Kita Perish H wrote: [facility] that prompted transfer to Nurse Triage: Lump on right lower side of groin area, having pain and warm to the touch. Reason for Disposition  [1] Single large node AND [2] size > 1 inch (2.5 cm) AND [3] no fever  Answer Assessment - Initial Assessment Questions 1. LOCATION: "Where is the swollen node located?" "Is the matching node on the other side of the body also swollen?"      Right side of groin.  2. SIZE: "How big is the node?" (e.g., inches or centimeters; or compared to common objects such as pea, bean, marble, golf ball)      Larger than a golf ball, states it might be oblong.  3. ONSET: "When did the swelling start?"      This morning.  4. NECK NODES: "Is there a sore throat, runny nose or other symptoms of a cold?"      N/A.  5. GROIN OR ARMPIT NODES: "Is there a sore, scratch, cut or painful red area on that arm or leg?"      Denies. Warm to the touch and painful.  6. FEVER: "Do you have a fever?" If Yes, ask: "What is it, how was it measured, and when did it start?"       Denies.  7. CAUSE: "What do you think is causing the swollen lymph nodes?"     No history of hernias or abscesses.  8. OTHER SYMPTOMS: "Do you have any other symptoms?"     Denies.  9. PREGNANCY: "Is there any chance you are pregnant?" "When was your last menstrual period?"     N/A.  Protocols used: Lymph Nodes - Swollen-A-AH

## 2023-08-07 ENCOUNTER — Other Ambulatory Visit: Payer: Self-pay | Admitting: Family Medicine

## 2023-08-07 DIAGNOSIS — I10 Essential (primary) hypertension: Secondary | ICD-10-CM

## 2023-08-29 ENCOUNTER — Ambulatory Visit

## 2023-08-29 VITALS — BP 112/64 | Ht 69.0 in | Wt 161.0 lb

## 2023-08-29 DIAGNOSIS — Z2821 Immunization not carried out because of patient refusal: Secondary | ICD-10-CM | POA: Diagnosis not present

## 2023-08-29 DIAGNOSIS — Z0000 Encounter for general adult medical examination without abnormal findings: Secondary | ICD-10-CM

## 2023-08-29 NOTE — Progress Notes (Signed)
 Because this visit was a virtual/telehealth visit,  certain criteria was not obtained, such a blood pressure, CBG if applicable, and timed get up and go. Any medications not marked as "taking" were not mentioned during the medication reconciliation part of the visit. Any vitals not documented were not able to be obtained due to this being a telehealth visit or patient was unable to self-report a recent blood pressure reading due to a lack of equipment at home via telehealth. Vitals that have been documented are verbally provided by the patient.  This visit was performed by a medical professional under my direct supervision. I was immediately available for consultation/collaboration. I have reviewed and agree with the Annual Wellness Visit documentation.  Subjective:   Billy Burns is a 83 y.o. who presents for a Medicare Wellness preventive visit.  As a reminder, Annual Wellness Visits don't include a physical exam, and some assessments may be limited, especially if this visit is performed virtually. We may recommend an in-person follow-up visit with your provider if needed.  Visit Complete: Virtual I connected with  Billy Burns on 08/29/23 by a audio enabled telemedicine application and verified that I am speaking with the correct person using two identifiers.  Patient Location: Home  Provider Location: Home Office  I discussed the limitations of evaluation and management by telemedicine. The patient expressed understanding and agreed to proceed.  Vital Signs: Because this visit was a virtual/telehealth visit, some criteria may be missing or patient reported. Any vitals not documented were not able to be obtained and vitals that have been documented are patient reported.  VideoDeclined- This patient declined Librarian, academic. Therefore the visit was completed with audio only.  Persons Participating in Visit: Patient.  AWV Questionnaire: No: Patient Medicare  AWV questionnaire was not completed prior to this visit.  Cardiac Risk Factors include: advanced age (>69men, >21 women);hypertension;male gender;dyslipidemia     Objective:     Today's Vitals   08/29/23 1404  BP: 112/64  Weight: 161 lb (73 kg)  Height: 5\' 9"  (1.753 m)   Body mass index is 23.78 kg/m.     08/29/2023    2:18 PM 12/04/2022    4:45 PM 12/19/2021    9:42 AM 12/17/2020   10:07 AM 05/28/2019    3:11 PM 05/11/2019    3:42 AM 05/10/2019    9:09 AM  Advanced Directives  Does Patient Have a Medical Advance Directive? No No Yes No No  No  Type of Advance Directive   Living will      Would patient like information on creating a medical advance directive? No - Patient declined   No - Patient declined No - Patient declined No - Patient declined     Current Medications (verified) Outpatient Encounter Medications as of 08/29/2023  Medication Sig   acetaminophen  (TYLENOL ) 325 MG tablet Take 325 mg by mouth every 6 (six) hours as needed for moderate pain.    Alfalfa 500 MG TABS Take 9-12 tablets by mouth daily. 9-12 TABS/DAY    amLODipine  (NORVASC ) 5 MG tablet Take 1 tablet by mouth once daily   b complex vitamins tablet Take 1 tablet by mouth daily.   carvedilol  (COREG ) 25 MG tablet TAKE 1 TABLET BY MOUTH TWICE DAILY WITH A MEAL   cloNIDine  (CATAPRES ) 0.1 MG tablet Take 1 tablet by mouth twice daily   clotrimazole -betamethasone  (LOTRISONE ) cream Apply 1 Application topically daily.   Flaxseed, Linseed, (FLAX SEED OIL PO) Take 1,000 mg by mouth  daily.    levocetirizine (XYZAL ) 5 MG tablet Take 1 tablet (5 mg total) by mouth every evening.   NON FORMULARY Take 1 tablet by mouth in the morning and at bedtime. Prostate Ess plus   OVER THE COUNTER MEDICATION Take 2 tablets by mouth every evening. HerbLax   Probiotic Product (PROBIOTIC DAILY PO) Take 1 capsule by mouth daily.   sertraline  (ZOLOFT ) 25 MG tablet Take 1 tablet by mouth once daily   vitamin C (ASCORBIC ACID) 500 MG tablet  Take 500 mg by mouth 2 (two) times daily.    vitamin E 180 MG (400 UNITS) capsule Take 400 Units by mouth daily.   No facility-administered encounter medications on file as of 08/29/2023.    Allergies (verified) Ace inhibitors, Aspirin, Sulfa antibiotics, and Toprol xl [metoprolol succinate]   History: Past Medical History:  Diagnosis Date   Adenomatous colon polyp 06/05/11   Repeat 2018 per Dr. Willy Harvest   Allergic angioedema    ACE inhibitor/beta blocker   Diverticulosis    Gastroenteritis 2012   Headache(784.0)    Hyperlipidemia    Hypertension    Influenza A 02/2011   Intermittent vertigo    Microhematuria 01/06/2012   Dr. Jetta Morrow, urologist in [place], did cysto and bladder bx 06/24/12 and it was unremarkable   Prostate cancer [facility]) 2007   Dr. Katie Parks ([facility])  Pt is s/p brachytherapy 2011.  Jan 2015 PSA 0.6 ng/ml.   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 Rodolfo Clan)   Syncope    ? Malignant vasovagal syncope-Pacer placed   Past Surgical History:  Procedure Laterality Date   CATARACT EXTRACTION  1984, 1994   (lens implants bilat)   COLONOSCOPY  06/05/11   6mm sigmoid polyp removed (+adenomatous), moderate diverticulosis, internal hemorrhoids.  Repeat 2018.   CYSTOSTOMY W/ BLADDER BIOPSY  06/24/12   Benign   PACEMAKER INSERTION  02/2011   [employer]. W098 Dual chamber; place in [place], [place] when pt visiting there for holiday 02/2011.   RADIOACTIVE SEED IMPLANT     prostate 07/2009.  PSA dropped to below 2 after this (Dr. Katie Parks at [facility] in [facility])   TONSILLECTOMY  1950   TRANSTHORACIC ECHOCARDIOGRAM  02/2011   Normal   Family History  Problem Relation Age of Onset   Stroke Mother        during endarterectomy   Colon cancer Father    Diabetes Neg Hx    Social History   Socioeconomic History   Marital status: Married    Spouse name: Ninette Basque   Number of children: 2   Years of education: Not on file   Highest  education level: Not on file  Occupational History   Occupation: Retired    Comment: Data processing manager for [employer]  Tobacco Use   Smoking status: Former    Current packs/day: 0.00    Types: Cigarettes    Quit date: 03/26/1964    Years since quitting: 59.4   Smokeless tobacco: Never  Vaping Use   Vaping status: Never Used  Substance and Sexual Activity   Alcohol use: No   Drug use: No   Sexual activity: Not on file  Other Topics Concern   Not on file  Social History Narrative   Married, retired from [employer] 2004.No T/A/Ds.Walks daily, push mows his yard.   Lives with his daughter   Wife on Dialysis    Social Drivers of Corporate investment banker  Strain: Low Risk  (08/29/2023)   Overall Financial Resource Strain (CARDIA)    Difficulty of Paying Living Expenses: Not hard at all  Food Insecurity: No Food Insecurity (08/29/2023)   Hunger Vital Sign    Worried About Running Out of Food in the Last Year: Never true    Ran Out of Food in the Last Year: Never true  Transportation Needs: No Transportation Needs (08/29/2023)   PRAPARE - Administrator, Civil Service (Medical): No    Lack of Transportation (Non-Medical): No  Physical Activity: Insufficiently Active (08/29/2023)   Exercise Vital Sign    Days of Exercise per Week: 7 days    Minutes of Exercise per Session: 20 min  Stress: No Stress Concern Present (08/29/2023)   [employer] of Occupational Health - Occupational Stress Questionnaire    Feeling of Stress : Only a little  Social Connections: Socially Integrated (08/29/2023)   Social Connection and Isolation Panel [NHANES]    Frequency of Communication with Friends and Family: More than three times a week    Frequency of Social Gatherings with Friends and Family: More than three times a week    Attends Religious Services: More than 4 times per year    Active Member of [employer] or Organizations: Yes    Attends Engineer, structural: More than 4  times per year    Marital Status: Married    Tobacco Counseling Counseling given: Not Answered    Clinical Intake:  Pre-visit preparation completed: Yes  Pain : No/denies pain     BMI - recorded: 23.78 Nutritional Status: BMI of 19-24  Normal Nutritional Risks: None Diabetes: No  Lab Results  Component Value Date   HGBA1C 5.7 05/24/2016   HGBA1C 5.7 05/07/2012   HGBA1C 5.8 05/05/2008     How often do you need to have someone help you when you read instructions, pamphlets, or other written materials from your doctor or pharmacy?: 3 - Sometimes What is the last grade level you completed in school?: some colleges  Interpreter Needed?: No  Information entered by :: Juliann Ochoa   Activities of Daily Living     08/29/2023    2:16 PM  In your present state of health, do you have any difficulty performing the following activities:  Hearing? 0  Vision? 0  Difficulty concentrating or making decisions? 0  Walking or climbing stairs? 0  Dressing or bathing? 0  Doing errands, shopping? 0  Preparing Food and eating ? N  Using the Toilet? N  In the past six months, have you accidently leaked urine? N  Do you have problems with loss of bowel control? N  Managing your Medications? N  Managing your Finances? N  Housekeeping or managing your Housekeeping? N    Patient Care Team: Jobe Mulder, DO as PCP - General (Family Medicine) Verona Goodwill, MD (Cardiology) Jo Mouse (Ophthalmology)  I have updated your Care Teams any recent Medical Services you may have received from other providers in the past year.     Assessment:    This is a routine wellness examination for Billy Burns.  Hearing/Vision screen Hearing Screening - Comments:: Patient has no hearing difficulties Vision Screening - Comments:: Patient has a glasses   Goals Addressed             This Visit's Progress    Patient Stated       To stay alive       Depression Screen  08/29/2023    2:20 PM 07/17/2022    8:10 AM 12/19/2021    9:43 AM 12/17/2020   10:05 AM 01/15/2020    9:07 AM 06/21/2016    8:49 AM 01/02/2016   11:01 AM  PHQ 2/9 Scores  PHQ - 2 Score 2 0 0 1 0 0 0  PHQ- 9 Score 3 0         Fall Risk     08/29/2023    2:18 PM 01/16/2023    8:00 AM 07/17/2022    8:10 AM 12/19/2021    9:43 AM 12/17/2020   10:08 AM  Fall Risk   Falls in the past year? 0 1 0 0 0  Number falls in past yr: 0 1 0 0 0  Comment  had 3 falls.     Injury with Fall? 0 1 0 0 0  Comment  bruises only. Spent the night in the hospital     Risk for fall due to : No Fall Risks Impaired balance/gait No Fall Risks No Fall Risks History of fall(s);Impaired vision;Orthopedic patient  Follow up Falls evaluation completed Falls evaluation completed Falls evaluation completed Falls evaluation completed Falls prevention discussed;Education provided    MEDICARE RISK AT HOME:  Medicare Risk at Home Any stairs in or around the home?: Yes If so, are there any without handrails?: No Home free of loose throw rugs in walkways, pet beds, electrical cords, etc?: Yes Adequate lighting in your home to reduce risk of falls?: Yes Life alert?: No Use of a cane, walker or w/c?: No Grab bars in the bathroom?: No Shower chair or bench in shower?: Yes Elevated toilet seat or a handicapped toilet?: No  TIMED UP AND GO:  Was the test performed?  No  Cognitive Function: Impaired: Patient has current diagnosis of cognitive impairment.        08/29/2023    2:24 PM 08/29/2023    2:14 PM 12/19/2021   10:02 AM 12/17/2020   10:12 AM  6CIT Screen  What Year? 4 points 0 points 0 points 0 points  What month? 3 points 0 points 3 points 0 points  What time? 0 points 0 points 0 points 0 points  Count back from 20 4 points 0 points 0 points 0 points  Months in reverse 4 points 0 points 4 points 0 points  Repeat phrase 8 points 0 points 6 points 4 points  Total Score 23 points 0 points 13 points 4 points     Immunizations Immunization History  Administered Date(s) Administered   Fluad Quad(high Dose 65+) 01/15/2020, 01/17/2021   Influenza Split 01/14/2012, 01/05/2019   Influenza, High Dose Seasonal PF 03/14/2015, 02/12/2018, 01/17/2021   Influenza,inj,Quad PF,6+ Mos 01/08/2013   Moderna Sars-Covid-2 Vaccination 06/03/2019, 07/01/2019, 11/14/2019   Pneumococcal Conjugate-13 05/15/2013   Pneumococcal Polysaccharide-23 11/14/2007   Td 10/02/2005   Tdap 09/06/2016   Zoster, Live 11/14/2010    Screening Tests Health Maintenance  Topic Date Due   COVID-19 Vaccine (4 - 2024-25 season) 11/25/2022   INFLUENZA VACCINE  10/25/2023   Medicare Annual Wellness (AWV)  08/28/2024   DTaP/Tdap/Td (3 - Td or Tdap) 09/07/2026   Pneumonia Vaccine 62+ Years old  Completed   HPV VACCINES  Aged Out   Meningococcal B Vaccine  Aged Out   Zoster Vaccines- Shingrix  Discontinued    Health Maintenance  Health Maintenance Due  Topic Date Due   COVID-19 Vaccine (4 - 2024-25 season) 11/25/2022   Health Maintenance Items Addressed:  Additional Screening:  Vision Screening: Recommended annual ophthalmology exams for early detection of glaucoma and other disorders of the eye. Would you like a referral to an eye doctor? No    Dental Screening: Recommended annual dental exams for proper oral hygiene  Community Resource Referral / Chronic Care Management: CRR required this visit?  No   CCM required this visit?  No   Plan:    I have personally reviewed and noted the following in the patient's chart:   Medical and social history Use of alcohol, tobacco or illicit drugs  Current medications and supplements including opioid prescriptions. Patient is not currently taking opioid prescriptions. Functional ability and status Nutritional status Physical activity Advanced directives List of other physicians Hospitalizations, surgeries, and ER visits in previous 12 months Vitals Screenings to  include cognitive, depression, and falls Referrals and appointments  In addition, I have reviewed and discussed with patient certain preventive protocols, quality metrics, and best practice recommendations. A written personalized care plan for preventive services as well as general preventive health recommendations were provided to patient.   Freeda Jerry, [place]   08/29/2023   After Visit Summary: (MyChart) Due to this being a telephonic visit, the after visit summary with patients personalized plan was offered to patient via MyChart   Notes: Nothing significant to report at this time.

## 2023-08-29 NOTE — Patient Instructions (Signed)
 Billy Burns , Thank you for taking time out of your busy schedule to complete your Annual Wellness Visit with me. I enjoyed our conversation and look forward to speaking with you again next year. I, as well as your care team,  appreciate your ongoing commitment to your health goals. Please review the following plan we discussed and let me know if I can assist you in the future. Your Game plan/ To Do List    Referrals: If you haven't heard from the office you've been referred to, please reach out to them at the phone provided.  none Follow up Visits: Next Medicare AWV with our clinical staff: 09/04/2023   Have you seen your provider in the last 6 months (3 months if uncontrolled diabetes)? No Next Office Visit with your provider: n/a  Clinician Recommendations:  Aim for 30 minutes of exercise or brisk walking, 6-8 glasses of water, and 5 servings of fruits and vegetables each day.       This is a list of the screening recommended for you and due dates:  Health Maintenance  Topic Date Due   COVID-19 Vaccine (4 - 2024-25 season) 11/25/2022   Flu Shot  10/25/2023   Medicare Annual Wellness Visit  08/28/2024   DTaP/Tdap/Td vaccine (3 - Td or Tdap) 09/07/2026   Pneumonia Vaccine  Completed   HPV Vaccine  Aged Out   Meningitis B Vaccine  Aged Out   Zoster (Shingles) Vaccine  Discontinued    Advanced directives: (Declined) Advance directive discussed with you today. Even though you declined this today, please call our office should you change your mind, and we can give you the proper paperwork for you to fill out. Advance Care Planning is important because it:  [x]  Makes sure you receive the medical care that is consistent with your values, goals, and preferences  [x]  It provides guidance to your family and loved ones and reduces their decisional burden about whether or not they are making the right decisions based on your wishes.  Follow the link provided in your after visit summary or read  over the paperwork we have mailed to you to help you started getting your Advance Directives in place. If you need assistance in completing these, please reach out to us  so that we can help you!  See attachments for Preventive Care and Fall Prevention Tips.

## 2023-10-09 DIAGNOSIS — R102 Pelvic and perineal pain: Secondary | ICD-10-CM | POA: Diagnosis not present

## 2023-10-09 DIAGNOSIS — R55 Syncope and collapse: Secondary | ICD-10-CM | POA: Diagnosis not present

## 2023-10-09 DIAGNOSIS — S0990XA Unspecified injury of head, initial encounter: Secondary | ICD-10-CM | POA: Diagnosis not present

## 2023-10-09 DIAGNOSIS — W19XXXA Unspecified fall, initial encounter: Secondary | ICD-10-CM | POA: Diagnosis not present

## 2023-10-09 DIAGNOSIS — S2243XA Multiple fractures of ribs, bilateral, initial encounter for closed fracture: Secondary | ICD-10-CM | POA: Diagnosis not present

## 2023-10-09 DIAGNOSIS — M47816 Spondylosis without myelopathy or radiculopathy, lumbar region: Secondary | ICD-10-CM | POA: Diagnosis not present

## 2023-10-09 DIAGNOSIS — G319 Degenerative disease of nervous system, unspecified: Secondary | ICD-10-CM | POA: Diagnosis not present

## 2023-10-09 DIAGNOSIS — S2241XA Multiple fractures of ribs, right side, initial encounter for closed fracture: Secondary | ICD-10-CM | POA: Diagnosis not present

## 2023-10-09 DIAGNOSIS — K5730 Diverticulosis of large intestine without perforation or abscess without bleeding: Secondary | ICD-10-CM | POA: Diagnosis not present

## 2023-10-09 DIAGNOSIS — Z043 Encounter for examination and observation following other accident: Secondary | ICD-10-CM | POA: Diagnosis not present

## 2023-10-09 DIAGNOSIS — S199XXA Unspecified injury of neck, initial encounter: Secondary | ICD-10-CM | POA: Diagnosis not present

## 2023-10-09 DIAGNOSIS — M47814 Spondylosis without myelopathy or radiculopathy, thoracic region: Secondary | ICD-10-CM | POA: Diagnosis not present

## 2023-10-09 DIAGNOSIS — I4430 Unspecified atrioventricular block: Secondary | ICD-10-CM | POA: Diagnosis not present

## 2023-10-10 DIAGNOSIS — S22078A Other fracture of T9-T10 vertebra, initial encounter for closed fracture: Secondary | ICD-10-CM | POA: Diagnosis not present

## 2023-10-10 DIAGNOSIS — G309 Alzheimer's disease, unspecified: Secondary | ICD-10-CM | POA: Diagnosis not present

## 2023-10-10 DIAGNOSIS — S22069A Unspecified fracture of T7-T8 vertebra, initial encounter for closed fracture: Secondary | ICD-10-CM | POA: Diagnosis not present

## 2023-10-10 DIAGNOSIS — F32A Depression, unspecified: Secondary | ICD-10-CM | POA: Diagnosis not present

## 2023-10-10 DIAGNOSIS — S22059A Unspecified fracture of T5-T6 vertebra, initial encounter for closed fracture: Secondary | ICD-10-CM | POA: Diagnosis not present

## 2023-10-10 DIAGNOSIS — I358 Other nonrheumatic aortic valve disorders: Secondary | ICD-10-CM | POA: Diagnosis not present

## 2023-10-10 DIAGNOSIS — S2243XA Multiple fractures of ribs, bilateral, initial encounter for closed fracture: Secondary | ICD-10-CM | POA: Diagnosis not present

## 2023-10-10 DIAGNOSIS — I517 Cardiomegaly: Secondary | ICD-10-CM | POA: Diagnosis not present

## 2023-10-10 DIAGNOSIS — M6281 Muscle weakness (generalized): Secondary | ICD-10-CM | POA: Diagnosis not present

## 2023-10-10 DIAGNOSIS — I440 Atrioventricular block, first degree: Secondary | ICD-10-CM | POA: Diagnosis not present

## 2023-10-10 DIAGNOSIS — R55 Syncope and collapse: Secondary | ICD-10-CM | POA: Diagnosis not present

## 2023-10-10 DIAGNOSIS — R079 Chest pain, unspecified: Secondary | ICD-10-CM | POA: Diagnosis not present

## 2023-10-10 DIAGNOSIS — S22079A Unspecified fracture of T9-T10 vertebra, initial encounter for closed fracture: Secondary | ICD-10-CM | POA: Diagnosis not present

## 2023-10-10 DIAGNOSIS — F02811 Dementia in other diseases classified elsewhere, unspecified severity, with agitation: Secondary | ICD-10-CM | POA: Diagnosis not present

## 2023-10-10 DIAGNOSIS — F0283 Dementia in other diseases classified elsewhere, unspecified severity, with mood disturbance: Secondary | ICD-10-CM | POA: Diagnosis not present

## 2023-10-10 DIAGNOSIS — T1490XA Injury, unspecified, initial encounter: Secondary | ICD-10-CM | POA: Diagnosis not present

## 2023-10-10 DIAGNOSIS — S22089A Unspecified fracture of T11-T12 vertebra, initial encounter for closed fracture: Secondary | ICD-10-CM | POA: Diagnosis not present

## 2023-10-10 DIAGNOSIS — S22058A Other fracture of T5-T6 vertebra, initial encounter for closed fracture: Secondary | ICD-10-CM | POA: Diagnosis not present

## 2023-10-10 DIAGNOSIS — I10 Essential (primary) hypertension: Secondary | ICD-10-CM | POA: Diagnosis not present

## 2023-10-10 DIAGNOSIS — I480 Paroxysmal atrial fibrillation: Secondary | ICD-10-CM | POA: Diagnosis not present

## 2023-10-10 DIAGNOSIS — M25552 Pain in left hip: Secondary | ICD-10-CM | POA: Diagnosis not present

## 2023-10-10 DIAGNOSIS — Z87891 Personal history of nicotine dependence: Secondary | ICD-10-CM | POA: Diagnosis not present

## 2023-10-10 DIAGNOSIS — E785 Hyperlipidemia, unspecified: Secondary | ICD-10-CM | POA: Diagnosis not present

## 2023-10-10 DIAGNOSIS — M2578 Osteophyte, vertebrae: Secondary | ICD-10-CM | POA: Diagnosis not present

## 2023-10-10 DIAGNOSIS — F03918 Unspecified dementia, unspecified severity, with other behavioral disturbance: Secondary | ICD-10-CM | POA: Diagnosis not present

## 2023-10-10 DIAGNOSIS — S22068A Other fracture of T7-T8 thoracic vertebra, initial encounter for closed fracture: Secondary | ICD-10-CM | POA: Diagnosis not present

## 2023-10-10 DIAGNOSIS — S22058D Other fracture of T5-T6 vertebra, subsequent encounter for fracture with routine healing: Secondary | ICD-10-CM | POA: Diagnosis not present

## 2023-10-10 DIAGNOSIS — Z781 Physical restraint status: Secondary | ICD-10-CM | POA: Diagnosis not present

## 2023-10-10 DIAGNOSIS — S199XXA Unspecified injury of neck, initial encounter: Secondary | ICD-10-CM | POA: Diagnosis not present

## 2023-10-10 DIAGNOSIS — Z45010 Encounter for checking and testing of cardiac pacemaker pulse generator [battery]: Secondary | ICD-10-CM | POA: Diagnosis not present

## 2023-10-14 DIAGNOSIS — R55 Syncope and collapse: Secondary | ICD-10-CM | POA: Diagnosis not present

## 2023-10-14 DIAGNOSIS — Z45018 Encounter for adjustment and management of other part of cardiac pacemaker: Secondary | ICD-10-CM | POA: Diagnosis not present

## 2023-10-16 DIAGNOSIS — S72145A Nondisplaced intertrochanteric fracture of left femur, initial encounter for closed fracture: Secondary | ICD-10-CM | POA: Diagnosis not present

## 2023-10-16 DIAGNOSIS — W19XXXD Unspecified fall, subsequent encounter: Secondary | ICD-10-CM | POA: Diagnosis not present

## 2023-10-16 DIAGNOSIS — R918 Other nonspecific abnormal finding of lung field: Secondary | ICD-10-CM | POA: Diagnosis not present

## 2023-10-16 DIAGNOSIS — F03918 Unspecified dementia, unspecified severity, with other behavioral disturbance: Secondary | ICD-10-CM | POA: Diagnosis not present

## 2023-10-16 DIAGNOSIS — G9389 Other specified disorders of brain: Secondary | ICD-10-CM | POA: Diagnosis not present

## 2023-10-16 DIAGNOSIS — S2243XD Multiple fractures of ribs, bilateral, subsequent encounter for fracture with routine healing: Secondary | ICD-10-CM | POA: Diagnosis not present

## 2023-10-16 DIAGNOSIS — Z7982 Long term (current) use of aspirin: Secondary | ICD-10-CM | POA: Diagnosis not present

## 2023-10-16 DIAGNOSIS — S22069D Unspecified fracture of T7-T8 vertebra, subsequent encounter for fracture with routine healing: Secondary | ICD-10-CM | POA: Diagnosis not present

## 2023-10-16 DIAGNOSIS — R55 Syncope and collapse: Secondary | ICD-10-CM | POA: Diagnosis not present

## 2023-10-16 DIAGNOSIS — W19XXXA Unspecified fall, initial encounter: Secondary | ICD-10-CM | POA: Diagnosis not present

## 2023-10-16 DIAGNOSIS — S22059D Unspecified fracture of T5-T6 vertebra, subsequent encounter for fracture with routine healing: Secondary | ICD-10-CM | POA: Diagnosis not present

## 2023-10-16 DIAGNOSIS — Z4689 Encounter for fitting and adjustment of other specified devices: Secondary | ICD-10-CM | POA: Diagnosis not present

## 2023-10-16 DIAGNOSIS — M80052D Age-related osteoporosis with current pathological fracture, left femur, subsequent encounter for fracture with routine healing: Secondary | ICD-10-CM | POA: Diagnosis not present

## 2023-10-16 DIAGNOSIS — M6281 Muscle weakness (generalized): Secondary | ICD-10-CM | POA: Diagnosis not present

## 2023-10-16 DIAGNOSIS — Z950 Presence of cardiac pacemaker: Secondary | ICD-10-CM | POA: Diagnosis not present

## 2023-10-16 DIAGNOSIS — S79929A Unspecified injury of unspecified thigh, initial encounter: Secondary | ICD-10-CM | POA: Diagnosis not present

## 2023-10-16 DIAGNOSIS — M542 Cervicalgia: Secondary | ICD-10-CM | POA: Diagnosis not present

## 2023-10-16 DIAGNOSIS — S72142A Displaced intertrochanteric fracture of left femur, initial encounter for closed fracture: Secondary | ICD-10-CM | POA: Diagnosis not present

## 2023-10-16 DIAGNOSIS — S22089D Unspecified fracture of T11-T12 vertebra, subsequent encounter for fracture with routine healing: Secondary | ICD-10-CM | POA: Diagnosis not present

## 2023-10-16 DIAGNOSIS — S79912A Unspecified injury of left hip, initial encounter: Secondary | ICD-10-CM | POA: Diagnosis not present

## 2023-10-16 DIAGNOSIS — S72009A Fracture of unspecified part of neck of unspecified femur, initial encounter for closed fracture: Secondary | ICD-10-CM | POA: Diagnosis not present

## 2023-10-16 DIAGNOSIS — T1490XA Injury, unspecified, initial encounter: Secondary | ICD-10-CM | POA: Diagnosis not present

## 2023-10-16 DIAGNOSIS — S22079D Unspecified fracture of T9-T10 vertebra, subsequent encounter for fracture with routine healing: Secondary | ICD-10-CM | POA: Diagnosis not present

## 2023-10-16 DIAGNOSIS — G309 Alzheimer's disease, unspecified: Secondary | ICD-10-CM | POA: Diagnosis not present

## 2023-10-16 DIAGNOSIS — R0902 Hypoxemia: Secondary | ICD-10-CM | POA: Diagnosis not present

## 2023-10-16 DIAGNOSIS — I10 Essential (primary) hypertension: Secondary | ICD-10-CM | POA: Diagnosis not present

## 2023-10-16 DIAGNOSIS — F0283 Dementia in other diseases classified elsewhere, unspecified severity, with mood disturbance: Secondary | ICD-10-CM | POA: Diagnosis not present

## 2023-10-16 DIAGNOSIS — M80052A Age-related osteoporosis with current pathological fracture, left femur, initial encounter for fracture: Secondary | ICD-10-CM | POA: Diagnosis not present

## 2023-10-16 DIAGNOSIS — F0393 Unspecified dementia, unspecified severity, with mood disturbance: Secondary | ICD-10-CM | POA: Diagnosis not present

## 2023-10-17 DIAGNOSIS — Z8546 Personal history of malignant neoplasm of prostate: Secondary | ICD-10-CM | POA: Diagnosis not present

## 2023-10-17 DIAGNOSIS — M542 Cervicalgia: Secondary | ICD-10-CM | POA: Diagnosis not present

## 2023-10-17 DIAGNOSIS — I10 Essential (primary) hypertension: Secondary | ICD-10-CM | POA: Diagnosis not present

## 2023-10-17 DIAGNOSIS — S72145A Nondisplaced intertrochanteric fracture of left femur, initial encounter for closed fracture: Secondary | ICD-10-CM | POA: Diagnosis not present

## 2023-10-17 DIAGNOSIS — I2510 Atherosclerotic heart disease of native coronary artery without angina pectoris: Secondary | ICD-10-CM | POA: Diagnosis not present

## 2023-10-17 DIAGNOSIS — G9389 Other specified disorders of brain: Secondary | ICD-10-CM | POA: Diagnosis not present

## 2023-10-17 DIAGNOSIS — R918 Other nonspecific abnormal finding of lung field: Secondary | ICD-10-CM | POA: Diagnosis not present

## 2023-10-17 DIAGNOSIS — S2231XA Fracture of one rib, right side, initial encounter for closed fracture: Secondary | ICD-10-CM | POA: Diagnosis not present

## 2023-10-17 DIAGNOSIS — T1490XA Injury, unspecified, initial encounter: Secondary | ICD-10-CM | POA: Diagnosis not present

## 2023-10-17 DIAGNOSIS — D62 Acute posthemorrhagic anemia: Secondary | ICD-10-CM | POA: Diagnosis not present

## 2023-10-17 DIAGNOSIS — M6281 Muscle weakness (generalized): Secondary | ICD-10-CM | POA: Diagnosis not present

## 2023-10-17 DIAGNOSIS — R0902 Hypoxemia: Secondary | ICD-10-CM | POA: Diagnosis not present

## 2023-10-17 DIAGNOSIS — F0280 Dementia in other diseases classified elsewhere without behavioral disturbance: Secondary | ICD-10-CM | POA: Diagnosis not present

## 2023-10-17 DIAGNOSIS — Z66 Do not resuscitate: Secondary | ICD-10-CM | POA: Diagnosis not present

## 2023-10-17 DIAGNOSIS — S72142A Displaced intertrochanteric fracture of left femur, initial encounter for closed fracture: Secondary | ICD-10-CM | POA: Diagnosis not present

## 2023-10-17 DIAGNOSIS — M80052A Age-related osteoporosis with current pathological fracture, left femur, initial encounter for fracture: Secondary | ICD-10-CM | POA: Diagnosis not present

## 2023-10-17 DIAGNOSIS — S2232XA Fracture of one rib, left side, initial encounter for closed fracture: Secondary | ICD-10-CM | POA: Diagnosis not present

## 2023-10-17 DIAGNOSIS — F329 Major depressive disorder, single episode, unspecified: Secondary | ICD-10-CM | POA: Diagnosis not present

## 2023-10-17 DIAGNOSIS — Z950 Presence of cardiac pacemaker: Secondary | ICD-10-CM | POA: Diagnosis not present

## 2023-10-17 DIAGNOSIS — G309 Alzheimer's disease, unspecified: Secondary | ICD-10-CM | POA: Diagnosis not present

## 2023-10-17 DIAGNOSIS — S22009A Unspecified fracture of unspecified thoracic vertebra, initial encounter for closed fracture: Secondary | ICD-10-CM | POA: Diagnosis not present

## 2023-10-18 DIAGNOSIS — G8918 Other acute postprocedural pain: Secondary | ICD-10-CM | POA: Diagnosis not present

## 2023-10-18 DIAGNOSIS — M80052A Age-related osteoporosis with current pathological fracture, left femur, initial encounter for fracture: Secondary | ICD-10-CM | POA: Diagnosis not present

## 2023-10-20 DIAGNOSIS — G308 Other Alzheimer's disease: Secondary | ICD-10-CM | POA: Diagnosis not present

## 2023-10-20 DIAGNOSIS — Z4689 Encounter for fitting and adjustment of other specified devices: Secondary | ICD-10-CM | POA: Diagnosis not present

## 2023-10-20 DIAGNOSIS — F4323 Adjustment disorder with mixed anxiety and depressed mood: Secondary | ICD-10-CM | POA: Diagnosis not present

## 2023-10-20 DIAGNOSIS — M80052D Age-related osteoporosis with current pathological fracture, left femur, subsequent encounter for fracture with routine healing: Secondary | ICD-10-CM | POA: Diagnosis not present

## 2023-10-20 DIAGNOSIS — S2239XG Fracture of one rib, unspecified side, subsequent encounter for fracture with delayed healing: Secondary | ICD-10-CM | POA: Diagnosis not present

## 2023-10-20 DIAGNOSIS — F329 Major depressive disorder, single episode, unspecified: Secondary | ICD-10-CM | POA: Diagnosis not present

## 2023-10-20 DIAGNOSIS — M6281 Muscle weakness (generalized): Secondary | ICD-10-CM | POA: Diagnosis not present

## 2023-10-20 DIAGNOSIS — I2510 Atherosclerotic heart disease of native coronary artery without angina pectoris: Secondary | ICD-10-CM | POA: Diagnosis not present

## 2023-10-20 DIAGNOSIS — Z7982 Long term (current) use of aspirin: Secondary | ICD-10-CM | POA: Diagnosis not present

## 2023-10-20 DIAGNOSIS — G309 Alzheimer's disease, unspecified: Secondary | ICD-10-CM | POA: Diagnosis not present

## 2023-10-20 DIAGNOSIS — I10 Essential (primary) hypertension: Secondary | ICD-10-CM | POA: Diagnosis not present

## 2023-10-20 DIAGNOSIS — S2243XD Multiple fractures of ribs, bilateral, subsequent encounter for fracture with routine healing: Secondary | ICD-10-CM | POA: Diagnosis not present

## 2023-10-20 DIAGNOSIS — S22069D Unspecified fracture of T7-T8 vertebra, subsequent encounter for fracture with routine healing: Secondary | ICD-10-CM | POA: Diagnosis not present

## 2023-10-20 DIAGNOSIS — S22059D Unspecified fracture of T5-T6 vertebra, subsequent encounter for fracture with routine healing: Secondary | ICD-10-CM | POA: Diagnosis not present

## 2023-10-20 DIAGNOSIS — S2243XA Multiple fractures of ribs, bilateral, initial encounter for closed fracture: Secondary | ICD-10-CM | POA: Diagnosis not present

## 2023-10-20 DIAGNOSIS — Z950 Presence of cardiac pacemaker: Secondary | ICD-10-CM | POA: Diagnosis not present

## 2023-10-20 DIAGNOSIS — S22089D Unspecified fracture of T11-T12 vertebra, subsequent encounter for fracture with routine healing: Secondary | ICD-10-CM | POA: Diagnosis not present

## 2023-10-20 DIAGNOSIS — F0283 Dementia in other diseases classified elsewhere, unspecified severity, with mood disturbance: Secondary | ICD-10-CM | POA: Diagnosis not present

## 2023-10-20 DIAGNOSIS — F02B18 Dementia in other diseases classified elsewhere, moderate, with other behavioral disturbance: Secondary | ICD-10-CM | POA: Diagnosis not present

## 2023-10-20 DIAGNOSIS — Z743 Need for continuous supervision: Secondary | ICD-10-CM | POA: Diagnosis not present

## 2023-10-20 DIAGNOSIS — M9701XA Periprosthetic fracture around internal prosthetic right hip joint, initial encounter: Secondary | ICD-10-CM | POA: Diagnosis not present

## 2023-10-20 DIAGNOSIS — F0393 Unspecified dementia, unspecified severity, with mood disturbance: Secondary | ICD-10-CM | POA: Diagnosis not present

## 2023-10-20 DIAGNOSIS — W19XXXD Unspecified fall, subsequent encounter: Secondary | ICD-10-CM | POA: Diagnosis not present

## 2023-10-20 DIAGNOSIS — R4589 Other symptoms and signs involving emotional state: Secondary | ICD-10-CM | POA: Diagnosis not present

## 2023-10-20 DIAGNOSIS — S22079D Unspecified fracture of T9-T10 vertebra, subsequent encounter for fracture with routine healing: Secondary | ICD-10-CM | POA: Diagnosis not present

## 2023-10-20 DIAGNOSIS — I959 Hypotension, unspecified: Secondary | ICD-10-CM | POA: Diagnosis not present

## 2023-10-20 DIAGNOSIS — W19XXXA Unspecified fall, initial encounter: Secondary | ICD-10-CM | POA: Diagnosis not present

## 2023-10-21 DIAGNOSIS — I10 Essential (primary) hypertension: Secondary | ICD-10-CM | POA: Diagnosis not present

## 2023-10-21 DIAGNOSIS — M6281 Muscle weakness (generalized): Secondary | ICD-10-CM | POA: Diagnosis not present

## 2023-10-21 DIAGNOSIS — Z950 Presence of cardiac pacemaker: Secondary | ICD-10-CM | POA: Diagnosis not present

## 2023-10-21 DIAGNOSIS — I2510 Atherosclerotic heart disease of native coronary artery without angina pectoris: Secondary | ICD-10-CM | POA: Diagnosis not present

## 2023-10-21 DIAGNOSIS — F329 Major depressive disorder, single episode, unspecified: Secondary | ICD-10-CM | POA: Diagnosis not present

## 2023-10-21 DIAGNOSIS — S2239XG Fracture of one rib, unspecified side, subsequent encounter for fracture with delayed healing: Secondary | ICD-10-CM | POA: Diagnosis not present

## 2023-10-23 DIAGNOSIS — S2239XG Fracture of one rib, unspecified side, subsequent encounter for fracture with delayed healing: Secondary | ICD-10-CM | POA: Diagnosis not present

## 2023-10-23 DIAGNOSIS — I2510 Atherosclerotic heart disease of native coronary artery without angina pectoris: Secondary | ICD-10-CM | POA: Diagnosis not present

## 2023-10-23 DIAGNOSIS — F329 Major depressive disorder, single episode, unspecified: Secondary | ICD-10-CM | POA: Diagnosis not present

## 2023-10-23 DIAGNOSIS — I10 Essential (primary) hypertension: Secondary | ICD-10-CM | POA: Diagnosis not present

## 2023-10-23 DIAGNOSIS — Z950 Presence of cardiac pacemaker: Secondary | ICD-10-CM | POA: Diagnosis not present

## 2023-10-23 DIAGNOSIS — M6281 Muscle weakness (generalized): Secondary | ICD-10-CM | POA: Diagnosis not present

## 2023-10-24 ENCOUNTER — Telehealth: Payer: Self-pay | Admitting: Family Medicine

## 2023-10-24 NOTE — Telephone Encounter (Signed)
 He's in a SNF? I can't help him if he's there right now. If he's been DC's home, he will need some sort of F2F visit per medicare requirements. Happy to help with this though.

## 2023-10-24 NOTE — Telephone Encounter (Signed)
 Copied from CRM [id number]. Topic: General - Other >> Oct 24, 2023 11:52 AM Avram MATSU wrote: Reason for CRM: Daughter stated her dad had a few falls and was wondering if the pt can get a HH aid sent to the house for Skilled Nursing/custodial care. She stated insurance is asking for a letter of recommendation and as of right now he does received short term care at [facility] in [place].

## 2023-10-25 DIAGNOSIS — F329 Major depressive disorder, single episode, unspecified: Secondary | ICD-10-CM | POA: Diagnosis not present

## 2023-10-25 DIAGNOSIS — Z950 Presence of cardiac pacemaker: Secondary | ICD-10-CM | POA: Diagnosis not present

## 2023-10-25 DIAGNOSIS — M6281 Muscle weakness (generalized): Secondary | ICD-10-CM | POA: Diagnosis not present

## 2023-10-25 DIAGNOSIS — I2510 Atherosclerotic heart disease of native coronary artery without angina pectoris: Secondary | ICD-10-CM | POA: Diagnosis not present

## 2023-10-25 DIAGNOSIS — I10 Essential (primary) hypertension: Secondary | ICD-10-CM | POA: Diagnosis not present

## 2023-10-25 DIAGNOSIS — S2239XG Fracture of one rib, unspecified side, subsequent encounter for fracture with delayed healing: Secondary | ICD-10-CM | POA: Diagnosis not present

## 2023-10-25 NOTE — Telephone Encounter (Signed)
 Called pt daughter Lvm her know if he is still in SNF, he hasbe d/c home first.Call our office back if they  Have any question.

## 2023-10-26 DIAGNOSIS — S098XXA Other specified injuries of head, initial encounter: Secondary | ICD-10-CM | POA: Diagnosis not present

## 2023-10-26 DIAGNOSIS — I4430 Unspecified atrioventricular block: Secondary | ICD-10-CM | POA: Diagnosis not present

## 2023-10-26 DIAGNOSIS — M545 Low back pain, unspecified: Secondary | ICD-10-CM | POA: Diagnosis not present

## 2023-10-26 DIAGNOSIS — G9341 Metabolic encephalopathy: Secondary | ICD-10-CM | POA: Diagnosis not present

## 2023-10-26 DIAGNOSIS — I959 Hypotension, unspecified: Secondary | ICD-10-CM | POA: Diagnosis not present

## 2023-10-26 DIAGNOSIS — S3993XA Unspecified injury of pelvis, initial encounter: Secondary | ICD-10-CM | POA: Diagnosis not present

## 2023-10-26 DIAGNOSIS — M9701XA Periprosthetic fracture around internal prosthetic right hip joint, initial encounter: Secondary | ICD-10-CM | POA: Diagnosis not present

## 2023-10-26 DIAGNOSIS — R531 Weakness: Secondary | ICD-10-CM | POA: Diagnosis not present

## 2023-10-26 DIAGNOSIS — Z7189 Other specified counseling: Secondary | ICD-10-CM | POA: Diagnosis not present

## 2023-10-26 DIAGNOSIS — G309 Alzheimer's disease, unspecified: Secondary | ICD-10-CM | POA: Diagnosis not present

## 2023-10-26 DIAGNOSIS — W19XXXA Unspecified fall, initial encounter: Secondary | ICD-10-CM | POA: Diagnosis not present

## 2023-10-26 DIAGNOSIS — S3991XA Unspecified injury of abdomen, initial encounter: Secondary | ICD-10-CM | POA: Diagnosis not present

## 2023-10-26 DIAGNOSIS — S2243XA Multiple fractures of ribs, bilateral, initial encounter for closed fracture: Secondary | ICD-10-CM | POA: Diagnosis not present

## 2023-10-26 DIAGNOSIS — S728X2A Other fracture of left femur, initial encounter for closed fracture: Secondary | ICD-10-CM | POA: Diagnosis not present

## 2023-10-26 DIAGNOSIS — S299XXA Unspecified injury of thorax, initial encounter: Secondary | ICD-10-CM | POA: Diagnosis not present

## 2023-10-26 DIAGNOSIS — Z515 Encounter for palliative care: Secondary | ICD-10-CM | POA: Diagnosis not present

## 2023-10-26 DIAGNOSIS — S199XXA Unspecified injury of neck, initial encounter: Secondary | ICD-10-CM | POA: Diagnosis not present

## 2023-10-26 DIAGNOSIS — F0280 Dementia in other diseases classified elsewhere without behavioral disturbance: Secondary | ICD-10-CM | POA: Diagnosis not present

## 2023-10-26 DIAGNOSIS — M978XXA Periprosthetic fracture around other internal prosthetic joint, initial encounter: Secondary | ICD-10-CM | POA: Diagnosis not present

## 2023-10-26 DIAGNOSIS — Y92129 Unspecified place in nursing home as the place of occurrence of the external cause: Secondary | ICD-10-CM | POA: Diagnosis not present

## 2023-10-26 DIAGNOSIS — S2241XA Multiple fractures of ribs, right side, initial encounter for closed fracture: Secondary | ICD-10-CM | POA: Diagnosis not present

## 2023-10-26 DIAGNOSIS — M546 Pain in thoracic spine: Secondary | ICD-10-CM | POA: Diagnosis not present

## 2023-10-27 DIAGNOSIS — M8468XA Pathological fracture in other disease, other site, initial encounter for fracture: Secondary | ICD-10-CM | POA: Diagnosis not present

## 2023-10-27 DIAGNOSIS — F32A Depression, unspecified: Secondary | ICD-10-CM | POA: Diagnosis not present

## 2023-10-27 DIAGNOSIS — Z7982 Long term (current) use of aspirin: Secondary | ICD-10-CM | POA: Diagnosis not present

## 2023-10-27 DIAGNOSIS — G309 Alzheimer's disease, unspecified: Secondary | ICD-10-CM | POA: Diagnosis not present

## 2023-10-27 DIAGNOSIS — F02C3 Dementia in other diseases classified elsewhere, severe, with mood disturbance: Secondary | ICD-10-CM | POA: Diagnosis not present

## 2023-10-27 DIAGNOSIS — Z4789 Encounter for other orthopedic aftercare: Secondary | ICD-10-CM | POA: Diagnosis not present

## 2023-10-27 DIAGNOSIS — Z950 Presence of cardiac pacemaker: Secondary | ICD-10-CM | POA: Diagnosis not present

## 2023-10-27 DIAGNOSIS — M9702XA Periprosthetic fracture around internal prosthetic left hip joint, initial encounter: Secondary | ICD-10-CM | POA: Diagnosis not present

## 2023-10-27 DIAGNOSIS — S51811A Laceration without foreign body of right forearm, initial encounter: Secondary | ICD-10-CM | POA: Diagnosis not present

## 2023-10-27 DIAGNOSIS — R296 Repeated falls: Secondary | ICD-10-CM | POA: Diagnosis not present

## 2023-10-27 DIAGNOSIS — Z66 Do not resuscitate: Secondary | ICD-10-CM | POA: Diagnosis not present

## 2023-10-27 DIAGNOSIS — S51012A Laceration without foreign body of left elbow, initial encounter: Secondary | ICD-10-CM | POA: Diagnosis not present

## 2023-10-27 DIAGNOSIS — S3993XA Unspecified injury of pelvis, initial encounter: Secondary | ICD-10-CM | POA: Diagnosis not present

## 2023-10-27 DIAGNOSIS — Z515 Encounter for palliative care: Secondary | ICD-10-CM | POA: Diagnosis not present

## 2023-10-27 DIAGNOSIS — F02C18 Dementia in other diseases classified elsewhere, severe, with other behavioral disturbance: Secondary | ICD-10-CM | POA: Diagnosis not present

## 2023-10-27 DIAGNOSIS — S72142A Displaced intertrochanteric fracture of left femur, initial encounter for closed fracture: Secondary | ICD-10-CM | POA: Diagnosis not present

## 2023-10-27 DIAGNOSIS — D62 Acute posthemorrhagic anemia: Secondary | ICD-10-CM | POA: Diagnosis not present

## 2023-10-27 DIAGNOSIS — S299XXA Unspecified injury of thorax, initial encounter: Secondary | ICD-10-CM | POA: Diagnosis not present

## 2023-10-27 DIAGNOSIS — R634 Abnormal weight loss: Secondary | ICD-10-CM | POA: Diagnosis not present

## 2023-10-27 DIAGNOSIS — M25562 Pain in left knee: Secondary | ICD-10-CM | POA: Diagnosis not present

## 2023-10-27 DIAGNOSIS — G4700 Insomnia, unspecified: Secondary | ICD-10-CM | POA: Diagnosis not present

## 2023-10-27 DIAGNOSIS — S0001XA Abrasion of scalp, initial encounter: Secondary | ICD-10-CM | POA: Diagnosis not present

## 2023-10-27 DIAGNOSIS — I10 Essential (primary) hypertension: Secondary | ICD-10-CM | POA: Diagnosis not present

## 2023-10-27 DIAGNOSIS — M8588 Other specified disorders of bone density and structure, other site: Secondary | ICD-10-CM | POA: Diagnosis not present

## 2023-10-27 DIAGNOSIS — J984 Other disorders of lung: Secondary | ICD-10-CM | POA: Diagnosis not present

## 2023-10-27 DIAGNOSIS — Z79899 Other long term (current) drug therapy: Secondary | ICD-10-CM | POA: Diagnosis not present

## 2023-11-01 ENCOUNTER — Ambulatory Visit: Payer: Self-pay | Admitting: *Deleted

## 2023-11-01 NOTE — Telephone Encounter (Signed)
  FYI Only or Action Required?: Action required by provider: clinical question for provider and recommendations for OTC treatment for burning with urination.  Patient was last seen in primary care on 01/16/2023 by Frann Mabel Mt, DO.  Called Nurse Triage reporting Pain.  Symptoms began today.  Interventions attempted: Nothing.  Symptoms are: gradually worsening.  Triage Disposition: See Physician Within 24 Hours  Patient/caregiver understands and will follow disposition?: No, wishes to speak with PCP                Copied from CRM #[id number]. Topic: Clinical - Red Word Triage >> Nov 01, 2023  2:41 PM Jasmin G wrote: Red Word that prompted transfer to Nurse Triage: Pt got discharged from [facility] recently due experiencing burning while urinating, he was put on a catheter most of time. I scheduled appt for him on 8/15, but daughter wanted to see if there was anything else that could be done or prescribed in the meantime to deal with the pain. Reason for Disposition  All other males with painful urination  Answer Assessment - Initial Assessment Questions Offered patient earlier appt than 11/11/23 for hospital f/u appt. Daughter requesting to keep appt and if PCP could recommend OTC medications or treatments for burning with urination. Daughter unsure if skin to end of penis red. Reports no discolored urine, no odor no temp. May send recommendations via my chart account. Recommended wash with mild soapy and water for hygiene and rinse thoroughly and tap dry.     1. SEVERITY: How bad is the pain?  (e.g., Scale 1-10; mild, moderate, or severe)     Burning with every urination per patient daughter on DPR 2. FREQUENCY: How many times have you had painful urination today?      Every urination 3. PATTERN: Is pain present every time you urinate or just sometimes?      everytime 4. ONSET: When did the painful urination start?      Since being home from  hospital 5. FEVER: Do you have a fever? If Yes, ask: What is your temperature, how was it measured, and when did it start?     na 6. PAST UTI: Have you had a urine infection before? If Yes, ask: When was the last time? and What happened that time?      Na  7. CAUSE: What do you think is causing the painful urination?     Possible skin irritation from external urinary catheter while in hospital 8. OTHER SYMPTOMS: Do you have any other symptoms? (e.g., flank pain, penis discharge, scrotal pain, blood in urine)     No other sx per daughter.  Protocols used: Urination Pain - Male-A-AH

## 2023-11-02 DIAGNOSIS — I10 Essential (primary) hypertension: Secondary | ICD-10-CM | POA: Diagnosis not present

## 2023-11-02 DIAGNOSIS — D649 Anemia, unspecified: Secondary | ICD-10-CM | POA: Diagnosis not present

## 2023-11-02 DIAGNOSIS — S0180XD Unspecified open wound of other part of head, subsequent encounter: Secondary | ICD-10-CM | POA: Diagnosis not present

## 2023-11-02 DIAGNOSIS — F32A Depression, unspecified: Secondary | ICD-10-CM | POA: Diagnosis not present

## 2023-11-02 DIAGNOSIS — G309 Alzheimer's disease, unspecified: Secondary | ICD-10-CM | POA: Diagnosis not present

## 2023-11-02 DIAGNOSIS — G9341 Metabolic encephalopathy: Secondary | ICD-10-CM | POA: Diagnosis not present

## 2023-11-02 DIAGNOSIS — Z556 Problems related to health literacy: Secondary | ICD-10-CM | POA: Diagnosis not present

## 2023-11-02 DIAGNOSIS — Z9181 History of falling: Secondary | ICD-10-CM | POA: Diagnosis not present

## 2023-11-02 DIAGNOSIS — D72819 Decreased white blood cell count, unspecified: Secondary | ICD-10-CM | POA: Diagnosis not present

## 2023-11-02 DIAGNOSIS — S72142D Displaced intertrochanteric fracture of left femur, subsequent encounter for closed fracture with routine healing: Secondary | ICD-10-CM | POA: Diagnosis not present

## 2023-11-02 DIAGNOSIS — F0283 Dementia in other diseases classified elsewhere, unspecified severity, with mood disturbance: Secondary | ICD-10-CM | POA: Diagnosis not present

## 2023-11-02 DIAGNOSIS — S2243XD Multiple fractures of ribs, bilateral, subsequent encounter for fracture with routine healing: Secondary | ICD-10-CM | POA: Diagnosis not present

## 2023-11-02 DIAGNOSIS — S51801D Unspecified open wound of right forearm, subsequent encounter: Secondary | ICD-10-CM | POA: Diagnosis not present

## 2023-11-02 DIAGNOSIS — M9702XD Periprosthetic fracture around internal prosthetic left hip joint, subsequent encounter: Secondary | ICD-10-CM | POA: Diagnosis not present

## 2023-11-04 ENCOUNTER — Telehealth: Payer: Self-pay | Admitting: Neurology

## 2023-11-04 ENCOUNTER — Other Ambulatory Visit: Payer: Self-pay

## 2023-11-04 ENCOUNTER — Encounter: Payer: Self-pay | Admitting: Family Medicine

## 2023-11-04 ENCOUNTER — Telehealth: Payer: Self-pay

## 2023-11-04 NOTE — Telephone Encounter (Signed)
 Called pt daughter Lvm to tried see about getting him on tomorrow at [phone]. Ask her to call us  Back let us  know if it will work.

## 2023-11-04 NOTE — Telephone Encounter (Signed)
 Copied from CRM [id number]. Topic: Clinical - Medical Advice >> Nov 04, 2023  9:41 AM Vena H wrote: Reason for CRM: Nurse Manager April from [facility] called in to notify that pt's daughter states his blood pressure was 100 systolic and then 30 mins after taking medications it went down to 95. Pt's daughter Butler is wanting a call back [phone]

## 2023-11-04 NOTE — Telephone Encounter (Signed)
 Pt daughter called back appt set for tomorrow [phone].

## 2023-11-05 ENCOUNTER — Ambulatory Visit (INDEPENDENT_AMBULATORY_CARE_PROVIDER_SITE_OTHER): Admitting: Family Medicine

## 2023-11-05 ENCOUNTER — Encounter: Payer: Self-pay | Admitting: Family Medicine

## 2023-11-05 VITALS — BP 110/64 | HR 74 | Temp 98.0°F | Resp 16 | Ht 69.0 in | Wt 127.0 lb

## 2023-11-05 DIAGNOSIS — R300 Dysuria: Secondary | ICD-10-CM | POA: Diagnosis not present

## 2023-11-05 DIAGNOSIS — R42 Dizziness and giddiness: Secondary | ICD-10-CM | POA: Diagnosis not present

## 2023-11-05 DIAGNOSIS — I10 Essential (primary) hypertension: Secondary | ICD-10-CM | POA: Diagnosis not present

## 2023-11-05 DIAGNOSIS — R29898 Other symptoms and signs involving the musculoskeletal system: Secondary | ICD-10-CM

## 2023-11-05 NOTE — Patient Instructions (Addendum)
 We will be in touch with the urine results.   Send me a message Friday if we are still symptomatic.   Monitor BP and symptoms at home.   Stay hydrated.  Constipation can contribute to pain with urination.   OT should help with the left arm weakness.   We are stopping the clonidine  and carvedilol  for now.   Let us  know if you need anything.

## 2023-11-05 NOTE — Progress Notes (Signed)
 Chief Complaint  Patient presents with   Hypertension    BP     Subjective Billy Burns is a 83 y.o. male who presents for hypertension follow up. Here w son who helps w the history.  He does monitor home blood pressures. Blood pressures ranging from 90's/50-60's on average. He is compliant with medications-clonidine  0.1 mg twice daily, Coreg  25 mg twice daily, Norvasc  5 mg daily Patient has these side effects of medication: none He is not adhering to a healthy diet overall. Decreased appetite.  He is down around 30 pounds over the past few months. He is getting very lightheaded and seeing stars when he stands up. Current exercise: none No CP or SOB.   Over the past 4 days, has had burning with urination.  It is improved.  He has some some constipation and his children just started giving him MiraLAX.  No bleeding or discharge.  This does not an issue in the hospital per his son.  Since his hospitalization a couple weeks ago, he has had weakness in his left upper extremity.  CT scanning was unremarkable.  He has a pacemaker in place so an MRI was not done.  He also has left lower extremity weakness but his left hip was what was operated on.   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 Avram   Allergic angioedema    ACE inhibitor/beta blocker   Diverticulosis    Gastroenteritis 2012   Headache(784.0)    Hyperlipidemia    Hypertension    Influenza A 02/2011   Intermittent vertigo    Microhematuria 01/06/2012   Dr. Sid, urologist in [place], did cysto and bladder bx 06/24/12 and it was unremarkable   Prostate cancer [facility]) 2007   Dr. Gwenith ([facility] in [facility])  Pt is s/p brachytherapy 2011.  Jan 2015 PSA 0.6 ng/ml.   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 Fernande)   Syncope    ? Malignant vasovagal syncope-Pacer placed    Exam BP 110/64 (BP Location: Left Arm, Patient Position: Sitting)   Pulse 74    Temp 98 F (36.7 C) (Oral)   Resp 16   Ht 5' 9 (1.753 m)   Wt 127 lb (57.6 kg)   SpO2 97%   BMI 18.75 kg/m  General:  well developed, well nourished, in no apparent distress Heart: RRR, no bruits, no LE edema Abd: BS+, S, NT, ND Neuro: 5/5 strength in RUE. 4/5 strength with L elbow flexion/extension. 5/5 strength L shoulder internal/ext rotation.  Lungs: clear to auscultation, no accessory muscle use Psych: Limited judgment  Essential hypertension  Lightheadedness  Upper extremity weakness  Dysuria - Plan: Urine Microscopic Only, Urine Culture  He is on too much medication.  Continue amlodipine  5 mg daily.  Stop Coreg  25 mg twice daily, clonidine  0.1 mg twice daily.  Monitor blood pressure and symptoms at home.  Send message Friday if still having symptoms.  At that point we will take away his amlodipine . Secondary to #1.  He needs to increase his hydration and oral intake. I think he may have had a stroke.  Cannot verify as he has a pacemaker and they will not do an MRI.  Occupational Therapy and physical therapy are working with him at home.  Continue with this. Check urine microscopy and culture.  Could be due to constipation.  Agree with MiraLAX.  Need to push fluids.  F/u in 2 weeks to recheck #1. The patient and his son voiced understanding and agreement to the plan.  Mabel Mt [place], DO 11/05/23  5:02 PM

## 2023-11-06 ENCOUNTER — Ambulatory Visit: Payer: Self-pay | Admitting: Family Medicine

## 2023-11-06 LAB — URINALYSIS, MICROSCOPIC ONLY

## 2023-11-06 MED ORDER — CEPHALEXIN 500 MG PO CAPS
500.0000 mg | ORAL_CAPSULE | Freq: Three times a day (TID) | ORAL | 0 refills | Status: DC
Start: 2023-11-06 — End: 2023-11-08

## 2023-11-06 NOTE — Progress Notes (Signed)
 Chief Complaint:  1. Closed comminuted intertrochanteric fracture of proximal end of left femur, initial encounter    (CMD)  Ambulatory referral to Orthopedic Surgery   Ambulatory referral to Home Health   Ambulatory referral to Orthopedic Surgery      HPI:  HPI Billy Burns is a 83 y.o.  male,  who is seen today for  follow-up evaluation of  1. Closed comminuted intertrochanteric fracture of proximal end of left femur, initial encounter    (CMD)  Ambulatory referral to Orthopedic Surgery   Ambulatory referral to Home Health   Ambulatory referral to Orthopedic Surgery    .   Billy Burns is 3 weeks status post surgical management of their fracture(s).   Billy Burns is accompanied in clinic today by his son They are recovering at home with HHPT. Home from rehab 1 wk ago. Readmitted to [facility] after fall with rib fractures.   They have been WBAT on the affected limb.    Billy Burns has been compliant with these restrictions. They deny fever, drainage from incisions or splint.   Billy Burns has been not been taking aspirin apparently for DVT prophylaxis since discharge from facility.   Problem List[1]  Allergies: Allergies[2]  Medications: Home Medications           * acetaminophen  (TYLENOL ) 500 mg tablet   * amLODIPine  (NORVASC ) 5 mg tablet   * amLODIPine  (NORVASC ) 5 mg tablet   * ascorbic acid/elderberry fruit (AIRBORNE, ELDERBERRY, ORAL)   * aspirin 81 mg chewable tablet   * aspirin 81 mg EC tablet    Take 1 tablet (81 mg total) by mouth 2 (two) times a day.   * carvediloL  (COREG ) 25 mg tablet   * cloNIDine  (CATAPRES ) 0.1 mg tablet   * cloNIDine  (CATAPRES ) 0.1 mg tablet   * multivit-min-folic acid-lycopen-lutein (Centrum Silver Men) 300-60-600-300 mcg tab   * MULTIVITAMIN ORAL   * NON FORMULARY   * polyethylene glycol (MIRALAX) 17 gram powd powder   * senna (SENOKOT) 8.6 mg tablet    Take 1 tablet (8.6 mg total) by mouth 2 (two) times a day.   * sertraline   (ZOLOFT ) 25 mg tablet   *    *  *  *     Flag for Review           * carvediloL  (COREG ) 25 mg tablet       Review of Systems Constitutional: Patient denies fever, chills, weight loss Respiratory: Patient denies shortness of breath Cardiovascular: Patient denies chest pain  All other systems were reviewed and are negative except as indicated in the HPI   Objective: Vital Signs: Vitals:   11/07/23 1026  BP: (!) 95/54  Pulse: 89  Temp: 97.9 F (36.6 C)  SpO2: 99%    PHYSICAL EXAM GENERAL: No acute distress.   Well nourished and well hydrated. Appears stated age. PSYCHIATRIC: Normal mood, affect, behavior and judgement HEENT: Normocephalic, atraumatic. Extraocular movements intact.  Mucous membranes moist   NECK: Supple, trachea midline.  RESPIRATORY: Normal respiratory effort and work of breathing  CARDIOVASCULAR: Intact distal pulses SKIN: Warm and dry, no rash. NEUROLOGIC: Alert and oriented.  MUSCULOSKELETAL:  Gait: Not evaluated  LEFT LOWER EXTREMITY INCISION(S): Clean, dry, intact, scabbing over all incisions without drainage.  No open wounds. INSPECTION: Nml, symmetric, skin intact, no rashes, ulcers or lesions ALIGNMENT: Nml skeletal alignment no deformity PALPATION: Expected TTP about lateral distal femur.  Mild tenderness over the hip. ROM: Able to  range knee actively near full extension to approximately 125 degrees of flexion.  Able to resist hip flexion and knee and knee extension against gravity SENSORY: sensation is intact to light touch in:  superficial peroneal nerve distribution (over dorsum of foot) deep peroneal nerve distribution (over first dorsal web space) sural nerve distribution (posterior to lateral malleolus) saphenous nerve distribution (over medial malleolus)  MOTOR:  + motor EHL (great toe dorsiflexion) + FHL (great toe plantar flexion)  + TA (ankle dorsiflexion)  + GSC (ankle plantar flexion)  VASCULAR:  toes warm and  well-perfused LYMPH: No lymphadenopathy  RADIOLOGY I have personally reviewed and interpreted the following radiology studies: none  Assessment:  83 y.o. male is 3 weeks status post surgical management of:  1. Closed comminuted intertrochanteric fracture of proximal end of left femur, initial encounter    (CMD)  Ambulatory referral to Orthopedic Surgery   Ambulatory referral to Home Health   Ambulatory referral to Orthopedic Surgery      Plan:  -Wound/incision care/instructions:  Sutures removed.  Okay to shower and get incision wet.  Avoid submergence in water for an additional week. -Weight bearing status: WBAT LLE -Precautions/Limitations: fall -Work Status: NA -PT/OT: Updated home health PT orders given for weightbearing as tolerated and strengthening.  No restrictions.  Fall precautions. -Pain medication: OTC pain medications  -DVT prophylaxis: mobilize, EC ASA for 3 more weeks   Patient understands risks/benefits of prophylactic treatment for deep venous thrombosis, including the fact that treatment can lead to increased bleeding, seroma formation, drainage, infection, possibly necessitating further surgery.  However, we have discussed that the risk of DVT leading to possible pulmonary embolus and death outweigh these risks and the pt will continue prophylactic treatment.   -RTC in 3 weeks with XROA AP and lateral x-ray of the left femur. -Osteoporosis referral given.  Recommend vitamin D supplementation.  Patient has not been taking. -All questions were answered to the patient's satisfaction.  Billy Nat Banner, MD       [1] Patient Active Problem List Diagnosis  . Major neurocognitive disorder due to Alzheimer's disease (CMD)  . Trauma  . Vasovagal syncope  . Closed comminuted intertrochanteric fracture of proximal end of left femur, initial encounter    (CMD)  . Essential hypertension  . History of prostate cancer  . Hyperlipidemia  . Pacemaker  .  Peri-prosthetic fracture around prosthetic hip  . Multiple closed fractures of ribs of both sides  . Palliative care encounter  [2] Allergies Allergen Reactions  . Ace Inhibitors Other (See Comments)    REACTION: Angioedema  . Ace Inhibitors     Per MAR  . Aspirin Other (See Comments)    REACTION: Stomach Discomfort/TAKES 81 MG BUT CANT TAKE 325 MG ASA PER PT.  SABRA Aspirin     Per MAR  . Beta-Adrenergic Agents     Per MAR  . Beta-Blockers (Beta-Adrenergic Blocking Agts) Other (See Comments)    Angioedema  . Sulfa (Sulfonamide Antibiotics)     Pt does not remember - happened in high school  . Sulfa (Sulfonamide Antibiotics)     Per MAR  *Some images could not be shown.

## 2023-11-07 ENCOUNTER — Other Ambulatory Visit: Payer: Self-pay | Admitting: Family Medicine

## 2023-11-07 DIAGNOSIS — I10 Essential (primary) hypertension: Secondary | ICD-10-CM

## 2023-11-07 LAB — URINE CULTURE
MICRO NUMBER:: 16819817
SPECIMEN QUALITY:: ADEQUATE

## 2023-11-08 ENCOUNTER — Inpatient Hospital Stay: Admitting: Family Medicine

## 2023-11-08 ENCOUNTER — Other Ambulatory Visit: Payer: Self-pay | Admitting: Family Medicine

## 2023-11-08 ENCOUNTER — Telehealth: Payer: Self-pay

## 2023-11-08 MED ORDER — CEFDINIR 300 MG PO CAPS: 300.0000 mg | ORAL_CAPSULE | Freq: Two times a day (BID) | ORAL | 0 refills | Status: AC

## 2023-11-08 NOTE — Telephone Encounter (Signed)
 Copied from CRM #[id number]. Topic: Clinical - Medical Advice >> Nov 08, 2023  3:24 PM Harlene ORN wrote: Reason for CRM:  Daughter of patient called. Patient is having some hip pain from his hip surgery. The prescribed medication is not as effective. Is also doing Physical therapy which is adding to the pain. Wants to know if we need to prescribe him more percocet.  Please call back the daughter: [phone]

## 2023-11-09 ENCOUNTER — Encounter: Payer: Self-pay | Admitting: Family Medicine

## 2023-11-11 ENCOUNTER — Telehealth: Payer: Self-pay

## 2023-11-11 NOTE — Telephone Encounter (Signed)
 Let her know to stop the amlodipine . Thx.

## 2023-11-11 NOTE — Telephone Encounter (Signed)
 Spoke with daughter and was advised to Amloipine and she stated understand.

## 2023-11-11 NOTE — Telephone Encounter (Signed)
 Called daughter and sent mychart message.

## 2023-11-11 NOTE — Telephone Encounter (Signed)
 Copied from CRM [id number]. Topic: Clinical - Medical Advice >> Nov 08, 2023  9:08 AM Thersia BROCKS wrote: Reason for CRM: Patient daugther called in regarding the status  of the blood pressue medication, stated when patient is laying down or sitting the top number 115to 120. when stand its dropping low to mid 90. Do he needs to continue amLODipine  (NORVASC ) 5 MG tablet , would like a callback regarding this   Butler [phone]

## 2023-11-17 ENCOUNTER — Other Ambulatory Visit: Payer: Self-pay | Admitting: Family Medicine

## 2023-11-17 DIAGNOSIS — F339 Major depressive disorder, recurrent, unspecified: Secondary | ICD-10-CM

## 2023-11-21 ENCOUNTER — Ambulatory Visit: Payer: Self-pay

## 2023-11-21 NOTE — Telephone Encounter (Signed)
 Copied from CRM #[id number]. Topic: Clinical - Red Word Triage >> Nov 21, 2023  1:04 PM Billy Burns wrote: Red Word that prompted transfer to Nurse Triage: saw today for pt complained of pain and stinging in groin area. baseball size lump in groin area  UPDATE:Pt's daughter Butler received a missed call from nurse Corean and was advised to call back.

## 2023-11-21 NOTE — Telephone Encounter (Signed)
 This encounter was created in error - please disregard. Please see alternate NT encounter.

## 2023-11-21 NOTE — Telephone Encounter (Signed)
 Left detailed message and also that we have appointments opened tomorrow if he would like come in then.  Advised that if he is having any fevers to go to ER now.  Also advised that we do have openings tomorrow.

## 2023-11-21 NOTE — Telephone Encounter (Signed)
 1st attempt made to contact daughter, Butler, no answer. Left voicemail for her to return call for disposition and care advice.  Copied from CRM #[id number]. Topic: Clinical - Red Word Triage >> Nov 21, 2023 12:48 PM Suzen RAMAN wrote: Red Word that prompted transfer to Nurse Triage: saw today for pt complained of pain and stinging in groin area. baseball size lump in groin area Reason for Disposition  Scrotum is painful or tender to touch  Answer Assessment - Initial Assessment Questions Garrel, home health PT, on the phone for triage. He states he is no longer with the patient, he is charting in his car. Attempted to call patient's daughter Butler as Garrel states she is at home with the patient, no answer. Left voicemail for her to return call as she will need to know disposition and care advice.   1. SCROTAL SWELLING: What does the scrotum look like? How swollen is it? (mild, moderate severe; compare to other side)     Swelling about the size of a baseball. Denies any redness/bruising or discoloration.  2. LOCATION: Where is the swelling located?     Groin/base of penis on right side.  3. ONSET: When did the swelling start?     Home health noticed it this morning and patient has memory problems and he states it started today as well.  4. PATTERN: Does it come and go, or has it been constant since it started?     Home health RN palpated the area this morning while patient was supine and only noted a small protrusion. Home health PT about 10 minutes ago had the patient stand and pull down his Depends and noted it is the size of a baseball.  5. SCROTAL PAIN: Is there any pain? If Yes, ask: How bad is it?  (Scale 1-10; or mild, moderate, severe)     Moderate.  6. HERNIA: Has a doctor ever told you that you have a hernia?     Unsure. None listed in PCP note from last office visit.  7. OTHER SYMPTOMS: Do you have any other symptoms? (e.g., abdomen pain, difficulty passing  urine, fever, vomiting)     PT asked if he had any urinary issues and patient declined and he states vitals were stable for the patient.  Protocols used: Scrotum Swelling-A-AH

## 2023-11-21 NOTE — Telephone Encounter (Signed)
 If only groin pain and no fevers, urinary symptoms, can probably wait for an office visit. Would defer to Operating Room Services judgment.

## 2023-11-21 NOTE — Telephone Encounter (Signed)
 FYI Only or Action Required?: Action required by provider: update on patient condition.  Patient was last seen in primary care on 11/05/2023 by Frann Mabel Mt, DO.  Called Nurse Triage reporting Groin Pain and Groin Swelling.  Symptoms began today.  Interventions attempted: Nothing.  Symptoms are: unchanged.  Triage Disposition: Go to ED Now (Notify PCP)  Patient/caregiver understands and will follow disposition?: No, wishes to speak with PCP     Pt daughter Butler calling back-- this triager reviewed care advise and disposition based of previous call from PT.   Butler reports that a [facility] nurse evaluated sx this AM and advised to follow up with PCP next week and that this concern has been an ongoing issue. This triager advised that disposition of ED is still recommended based off previous call and explained potential need for imaging/interventions based off sx reported. Caregiver verbalized understanding and wishes to make PCP aware.

## 2023-11-21 NOTE — Telephone Encounter (Signed)
 Erroneous Encounter

## 2023-11-22 ENCOUNTER — Ambulatory Visit (INDEPENDENT_AMBULATORY_CARE_PROVIDER_SITE_OTHER): Admitting: Family Medicine

## 2023-11-22 ENCOUNTER — Encounter: Payer: Self-pay | Admitting: Family Medicine

## 2023-11-22 VITALS — BP 138/66 | HR 94 | Temp 98.0°F | Resp 18 | Ht 69.0 in | Wt 126.0 lb

## 2023-11-22 DIAGNOSIS — F411 Generalized anxiety disorder: Secondary | ICD-10-CM | POA: Diagnosis not present

## 2023-11-22 DIAGNOSIS — K4090 Unilateral inguinal hernia, without obstruction or gangrene, not specified as recurrent: Secondary | ICD-10-CM | POA: Diagnosis not present

## 2023-11-22 MED ORDER — SERTRALINE HCL 50 MG PO TABS: 50.0000 mg | ORAL_TABLET | Freq: Every day | ORAL | 3 refills | Status: DC

## 2023-11-22 MED ORDER — BUSPIRONE HCL 5 MG PO TABS: 5.0000 mg | ORAL_TABLET | Freq: Three times a day (TID) | ORAL | 2 refills | Status: AC | PRN

## 2023-11-22 NOTE — Patient Instructions (Addendum)
 Keep the diet clean and stay active.  If you do not hear anything about your referral in the next 1-2 weeks, call our office and ask for an update.  Let us know if you need anything.

## 2023-11-22 NOTE — Progress Notes (Signed)
 Chief Complaint  Patient presents with   Follow-up   skin check    Right groin area -     Billy Burns is a 83 y.o. male here for a skin complaint.  He is here with his daughter who helps with the history.  Duration: 2 months Location: Right groin Pruritic? No Painful?  Sometimes Drainage? No New soaps/lotions/topicals/detergents? No Trauma? No Other associated symptoms: Waxes and wanes in size Therapies tried thus far: Rest and Tylenol   Patient has a history of depression/anxiety.  He has home nursing, occupational, and physical therapy visiting him.  This is causing him to be anxious and irritable.  He is compliant with Zoloft  25 mg daily.  No adverse effects.  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 Avram   Allergic angioedema    ACE inhibitor/beta blocker   Diverticulosis    Gastroenteritis 2012   Headache(784.0)    Hyperlipidemia    Hypertension    Influenza A 02/2011   Intermittent vertigo    Microhematuria 01/06/2012   Dr. Sid, urologist in [place], did cysto and bladder bx 06/24/12 and it was unremarkable   Prostate cancer [facility]) 2007   Dr. Gwenith ([facility] in [facility])  Pt is s/p brachytherapy 2011.  Jan 2015 PSA 0.6 ng/ml.   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 Fernande)   Syncope    ? Malignant vasovagal syncope-Pacer placed    BP 138/66   Pulse 94   Temp 98 F (36.7 C)   Resp 18   Ht 5' 9 (1.753 m)   Wt 126 lb (57.2 kg)   SpO2 95%   BMI 18.61 kg/m  Gen: awake, alert, appearing stated age Lungs: No accessory muscle use Skin: Gross bulge in the right inguinal region without TTP, fluctuance, drainage, erythema, or excoriation.  GU: Semisolid mass in the R inguinal region that does enlarge upon Valsalva. Psych: Normal affect  Non-recurrent unilateral inguinal hernia without obstruction or gangrene - Plan: Ambulatory referral to General Surgery  GAD (generalized anxiety  disorder) - Plan: sertraline  (ZOLOFT ) 50 MG tablet, busPIRone  (BUSPAR ) 5 MG tablet  We had a relatively long discussion about his options.  Could keep an eye on things or see a Careers adviser.  We decided to see a surgeon for logistical purposes should his symptoms worsen.  We will see what the surgery team has to say and proceed accordingly. Chronic, not controlled.  Increase Zoloft  from 25 mg daily to 50 mg daily.  Add BuSpar  5 mg 3 times daily as needed.  Follow-up in 6 weeks to recheck this. The patient and his daughter voiced understanding and agreement to the plan.  Mabel Mt [place], DO 11/22/23 10:23 AM

## 2023-11-27 ENCOUNTER — Ambulatory Visit: Admitting: Family Medicine

## 2023-12-02 DIAGNOSIS — S72142A Displaced intertrochanteric fracture of left femur, initial encounter for closed fracture: Secondary | ICD-10-CM | POA: Diagnosis not present

## 2023-12-10 DIAGNOSIS — K4090 Unilateral inguinal hernia, without obstruction or gangrene, not specified as recurrent: Secondary | ICD-10-CM | POA: Diagnosis not present

## 2023-12-24 ENCOUNTER — Encounter: Payer: Self-pay | Admitting: Family Medicine

## 2023-12-30 ENCOUNTER — Other Ambulatory Visit: Payer: Self-pay | Admitting: Family Medicine

## 2023-12-30 DIAGNOSIS — F411 Generalized anxiety disorder: Secondary | ICD-10-CM

## 2024-01-01 DIAGNOSIS — S72002S Fracture of unspecified part of neck of left femur, sequela: Secondary | ICD-10-CM | POA: Diagnosis not present

## 2024-01-01 DIAGNOSIS — Z8781 Personal history of (healed) traumatic fracture: Secondary | ICD-10-CM | POA: Diagnosis not present

## 2024-01-01 DIAGNOSIS — Z923 Personal history of irradiation: Secondary | ICD-10-CM | POA: Diagnosis not present

## 2024-01-01 DIAGNOSIS — M8000XA Age-related osteoporosis with current pathological fracture, unspecified site, initial encounter for fracture: Secondary | ICD-10-CM | POA: Diagnosis not present

## 2024-01-01 DIAGNOSIS — Z7189 Other specified counseling: Secondary | ICD-10-CM | POA: Diagnosis not present

## 2024-01-12 ENCOUNTER — Encounter: Payer: Self-pay | Admitting: Family Medicine

## 2024-01-13 ENCOUNTER — Other Ambulatory Visit: Payer: Self-pay | Admitting: Family Medicine

## 2024-01-13 DIAGNOSIS — S72142A Displaced intertrochanteric fracture of left femur, initial encounter for closed fracture: Secondary | ICD-10-CM | POA: Diagnosis not present

## 2024-01-13 DIAGNOSIS — Z950 Presence of cardiac pacemaker: Secondary | ICD-10-CM | POA: Diagnosis not present

## 2024-01-16 ENCOUNTER — Encounter: Payer: Self-pay | Admitting: Family Medicine

## 2024-01-22 ENCOUNTER — Telehealth: Payer: Self-pay | Admitting: Student in an Organized Health Care Education/Training Program

## 2024-01-22 NOTE — Telephone Encounter (Signed)
 Spoke w/ patients daughter Krystal to schedule with Dr. Almetta. Due to patient being in [place], [place] until Jan, patient is scheduled to see Dr. Almetta on 04/10/2024.   While on the phone, Butler states that on 10/17, she received an alert from [facility] that patient had an event for irregular heart beat that lasted for an hour and a half in the morning, while he was asleep. Afterwards, patient was more tired but didn't complain of any chest pain or sob. She thinks the event is due to him having had more activity the day prior, which wasn't his normal.   I inquired about why the alert was coming from [facility]. She stated that patient and his wife decided to let the battery die as they were told (she thinks by Dr. Fernande but isn't sure) that they didn't think the PPM was doing much for him. In July, patient had a fall. An evaluation was done on him at [facility], and that is where the cardiologist found out that the battery had expired. The cardiologist recommended at the time to go ahead and replace the battery since they were of unsure of the cause/reason of patient passing out. PPM Gen Change was done 10/14/2023 by Dr. Clare.  Confirmed with Krystal that they do want the care of PPM transferred back to [facility], she confirmed. Also confirmed PPM is still BSX.   She is aware I am sending this message as FYI for 04/10/2024 appointment.

## 2024-02-26 ENCOUNTER — Encounter: Payer: Self-pay | Admitting: Family Medicine

## 2024-02-26 DIAGNOSIS — F411 Generalized anxiety disorder: Secondary | ICD-10-CM

## 2024-02-27 MED ORDER — SERTRALINE HCL 50 MG PO TABS: 50.0000 mg | ORAL_TABLET | Freq: Every day | ORAL | 0 refills | Status: DC

## 2024-03-10 ENCOUNTER — Telehealth: Payer: Self-pay

## 2024-03-10 NOTE — Telephone Encounter (Signed)
 Called spoke with Pt daughter and advised her per my boss Luke, best bet is taking him to office in [facility] and having signed there.  Pt daughter stated understand .

## 2024-03-10 NOTE — Telephone Encounter (Signed)
 Copied from CRM #[id number]. Topic: General - Other >> Mar 10, 2024  2:22 PM Berneda FALCON wrote: Reason for CRM: Daughter, Butler, states she would like a physical copy of the DNR for the son as patient is visiting the son and wants to have access to it electronically. Is this something we can upload as a PDF to his mychart account?  She is his POA and Emergency contact.  [phone]

## 2024-03-27 ENCOUNTER — Other Ambulatory Visit: Payer: Self-pay | Admitting: Family Medicine

## 2024-03-27 DIAGNOSIS — F411 Generalized anxiety disorder: Secondary | ICD-10-CM

## 2024-03-31 ENCOUNTER — Ambulatory Visit: Admitting: Cardiology

## 2024-04-06 ENCOUNTER — Ambulatory Visit

## 2024-04-06 DIAGNOSIS — R55 Syncope and collapse: Secondary | ICD-10-CM

## 2024-04-07 ENCOUNTER — Ambulatory Visit: Payer: Self-pay | Admitting: Student in an Organized Health Care Education/Training Program

## 2024-04-07 LAB — CUP PACEART REMOTE DEVICE CHECK
Battery Remaining Longevity: 102 mo
Battery Remaining Percentage: 100 %
Brady Statistic RA Percent Paced: 6 %
Brady Statistic RV Percent Paced: 0 %
Date Time Interrogation Session: 20260112051700
Implantable Lead Connection Status: 753985
Implantable Lead Connection Status: 753985
Implantable Lead Implant Date: 20121227
Implantable Lead Implant Date: 20121227
Implantable Lead Location: 753859
Implantable Lead Location: 753859
Implantable Lead Model: 4469
Implantable Lead Model: 4471
Implantable Lead Serial Number: 502103
Implantable Lead Serial Number: 552434
Implantable Pulse Generator Implant Date: 20250721
Lead Channel Impedance Value: 417 Ohm
Lead Channel Impedance Value: 898 Ohm
Lead Channel Pacing Threshold Amplitude: 0.7 V
Lead Channel Pacing Threshold Amplitude: 3 V
Lead Channel Pacing Threshold Pulse Width: 0.4 ms
Lead Channel Pacing Threshold Pulse Width: 0.4 ms
Lead Channel Setting Pacing Amplitude: 2 V
Lead Channel Setting Pacing Amplitude: 2.4 V
Lead Channel Setting Pacing Pulse Width: 0.4 ms
Lead Channel Setting Sensing Sensitivity: 2.5 mV
Pulse Gen Serial Number: 881339
Zone Setting Status: 755011

## 2024-04-07 NOTE — Progress Notes (Signed)
 Remote PPM Transmission

## 2024-04-10 ENCOUNTER — Ambulatory Visit: Admitting: Student in an Organized Health Care Education/Training Program

## 2024-04-14 ENCOUNTER — Ambulatory Visit

## 2024-04-24 ENCOUNTER — Ambulatory Visit

## 2024-04-29 ENCOUNTER — Encounter: Payer: Self-pay | Admitting: Family Medicine

## 2024-05-15 ENCOUNTER — Ambulatory Visit: Admitting: Student in an Organized Health Care Education/Training Program

## 2024-06-12 ENCOUNTER — Ambulatory Visit: Admitting: Student in an Organized Health Care Education/Training Program

## 2024-07-06 ENCOUNTER — Ambulatory Visit

## 2024-07-14 ENCOUNTER — Ambulatory Visit

## 2024-09-03 ENCOUNTER — Ambulatory Visit

## 2024-10-05 ENCOUNTER — Ambulatory Visit

## 2024-10-13 ENCOUNTER — Ambulatory Visit

## 2025-01-04 ENCOUNTER — Ambulatory Visit

## 2025-01-12 ENCOUNTER — Ambulatory Visit

## 2025-04-05 ENCOUNTER — Ambulatory Visit

## 2025-04-13 ENCOUNTER — Ambulatory Visit
